# Patient Record
Sex: Male | Born: 1965 | Race: Black or African American | Hispanic: No | Marital: Single | Smoking: Current some day smoker
Health system: Southern US, Community
[De-identification: ages and names within clinical notes are randomized; demographics above are authoritative.]

## PROBLEM LIST (undated history)

## (undated) DIAGNOSIS — I272 Pulmonary hypertension, unspecified: Secondary | ICD-10-CM

## (undated) DIAGNOSIS — Z21 Asymptomatic human immunodeficiency virus [HIV] infection status: Secondary | ICD-10-CM

## (undated) DIAGNOSIS — B20 Human immunodeficiency virus [HIV] disease: Secondary | ICD-10-CM

## (undated) DIAGNOSIS — L309 Dermatitis, unspecified: Secondary | ICD-10-CM

## (undated) DIAGNOSIS — I5022 Chronic systolic (congestive) heart failure: Secondary | ICD-10-CM

## (undated) DIAGNOSIS — I10 Essential (primary) hypertension: Secondary | ICD-10-CM

## (undated) DIAGNOSIS — I2510 Atherosclerotic heart disease of native coronary artery without angina pectoris: Secondary | ICD-10-CM

## (undated) HISTORY — PX: APPENDECTOMY: SHX54

## (undated) HISTORY — DX: Pulmonary hypertension, unspecified: I27.20

## (undated) HISTORY — DX: Chronic systolic (congestive) heart failure: I50.22

## (undated) HISTORY — DX: Atherosclerotic heart disease of native coronary artery without angina pectoris: I25.10

---

## 2012-03-11 ENCOUNTER — Emergency Department: Payer: Self-pay | Admitting: Emergency Medicine

## 2012-11-06 ENCOUNTER — Emergency Department: Payer: Self-pay | Admitting: Emergency Medicine

## 2013-04-20 ENCOUNTER — Emergency Department: Payer: Self-pay | Admitting: Internal Medicine

## 2013-07-19 LAB — CBC WITH DIFFERENTIAL/PLATELET
Basophil %: 0.2 %
Eosinophil #: 0 10*3/uL (ref 0.0–0.7)
Eosinophil %: 0 %
HGB: 13.9 g/dL (ref 13.0–18.0)
Lymphocyte #: 0.8 10*3/uL — ABNORMAL LOW (ref 1.0–3.6)
Lymphocyte %: 6.6 %
MCH: 29.6 pg (ref 26.0–34.0)
Neutrophil %: 84.4 %
RBC: 4.68 10*6/uL (ref 4.40–5.90)

## 2013-07-19 LAB — URINALYSIS, COMPLETE
Blood: NEGATIVE
Glucose,UR: NEGATIVE mg/dL (ref 0–75)
Nitrite: NEGATIVE
Ph: 6 (ref 4.5–8.0)
RBC,UR: 1 /HPF (ref 0–5)
Specific Gravity: 1.035 (ref 1.003–1.030)
Squamous Epithelial: <1
WBC UR: 2 /HPF (ref 0–5)

## 2013-07-19 LAB — COMPREHENSIVE METABOLIC PANEL
Albumin: 2.6 g/dL — ABNORMAL LOW (ref 3.4–5.0)
Chloride: 99 mmol/L (ref 98–107)
Co2: 30 mmol/L (ref 21–32)
Creatinine: 1.16 mg/dL (ref 0.60–1.30)
EGFR (African American): >60
EGFR (Non-African Amer.): >60
Glucose: 118 mg/dL — ABNORMAL HIGH (ref 65–99)
SGPT (ALT): 10 U/L — ABNORMAL LOW (ref 12–78)

## 2013-07-19 LAB — LIPASE, BLOOD: Lipase: 73 U/L (ref 73–393)

## 2013-07-20 ENCOUNTER — Inpatient Hospital Stay: Payer: Self-pay | Admitting: Surgery

## 2013-07-20 LAB — CBC WITH DIFFERENTIAL/PLATELET
Basophil #: 0 10*3/uL (ref 0.0–0.1)
Basophil %: 0 %
Eosinophil #: 0.1 10*3/uL (ref 0.0–0.7)
Eosinophil %: 0.5 %
HCT: 42.7 % (ref 40.0–52.0)
Lymphocyte #: 0.3 10*3/uL — ABNORMAL LOW (ref 1.0–3.6)
MCHC: 33.1 g/dL (ref 32.0–36.0)
MCV: 90 fL (ref 80–100)
Monocyte #: 0.7 x10 3/mm (ref 0.2–1.0)
Monocyte %: 6.1 %
Neutrophil #: 10 10*3/uL — ABNORMAL HIGH (ref 1.4–6.5)
Neutrophil %: 90.7 %
Platelet: 196 10*3/uL (ref 150–440)
RBC: 4.76 10*6/uL (ref 4.40–5.90)
WBC: 11 10*3/uL — ABNORMAL HIGH (ref 3.8–10.6)

## 2013-07-21 LAB — CBC WITH DIFFERENTIAL/PLATELET
Basophil %: 0.2 %
Eosinophil #: 0 10*3/uL (ref 0.0–0.7)
Eosinophil %: 0 %
HCT: 40.9 % (ref 40.0–52.0)
Lymphocyte #: 0.5 10*3/uL — ABNORMAL LOW (ref 1.0–3.6)
Lymphocyte %: 6.5 %
MCH: 30.1 pg (ref 26.0–34.0)
MCHC: 33.7 g/dL (ref 32.0–36.0)
MCV: 89 fL (ref 80–100)
Monocyte #: 0.8 x10 3/mm (ref 0.2–1.0)
Neutrophil #: 7 10*3/uL — ABNORMAL HIGH (ref 1.4–6.5)
Neutrophil %: 84.1 %
Platelet: 225 10*3/uL (ref 150–440)
RDW: 13.7 % (ref 11.5–14.5)

## 2013-07-21 LAB — BASIC METABOLIC PANEL
BUN: 14 mg/dL (ref 7–18)
Calcium, Total: 8.9 mg/dL (ref 8.5–10.1)
Creatinine: 1.05 mg/dL (ref 0.60–1.30)
EGFR (African American): >60
EGFR (Non-African Amer.): >60
Osmolality: 274 (ref 275–301)
Sodium: 136 mmol/L (ref 136–145)

## 2013-07-25 LAB — PLATELET COUNT: Platelet: 355 10*3/uL (ref 150–440)

## 2014-11-25 NOTE — H&P (Signed)
Subjective/Chief Complaint abd pain   History of Present Illness abd pain lower abd since Friday no prior similar episode nausea, no emesis no f/c nml BM ate a hambuger and peas at 2000 prior to coming to ED, no emesis   Past History HIV, gets care at [facility] but ran out of meds. Should be on four meds. no prior surgery   Past Medical Health Smoking, Alcohol Consumption/Dependency   Past Med/Surgical Hx:  HIV:   NONE:   ALLERGIES:  No Known Allergies:   Family and Social History:  Family History Non-Contributory   Social History positive  tobacco, positive  tobacco (Current within 1 year), positive ETOH, negative Illicit drugs, daily smoking and ETOH, denioes IVDA   + Tobacco Current (within 1 year)   Place of Living Home   Review of Systems:  Fever/Chills No   Cough No   Abdominal Pain Yes   Diarrhea No   Constipation No   Nausea/Vomiting Yes   SOB/DOE No   Chest Pain No   Dysuria No   Tolerating Diet Yes  Nauseated   Physical Exam:  GEN uncomfortable   HEENT pink conjunctivae   NECK supple   RESP normal resp effort  clear BS   CARD regular rate   ABD positive tenderness  no hernia  distended  normal BS  tender diffusely   LYMPH negative neck   EXTR negative cyanosis/clubbing, negative edema   SKIN normal to palpation   PSYCH alert, A+O to time, place, person, good insight   Lab Results: Hepatic:  15-Dec-14 22:13   Bilirubin, Total 1.0  Alkaline Phosphatase 82 (45-117 NOTE: New Reference Range 06/25/13)  SGPT (ALT)  10  SGOT (AST)  14  Total Protein, Serum  9.1  Albumin, Serum  2.6  Routine Chem:  15-Dec-14 22:13   Lipase 73 (Result(s) reported on 19 Jul 2013 at 10:41PM.)  Glucose, Serum  118  BUN  19  Creatinine (comp) 1.16  Sodium, Serum  133  Potassium, Serum  3.3  Chloride, Serum 99  CO2, Serum 30  Calcium (Total), Serum 9.0  Osmolality (calc) 270  eGFR (African American) >60  eGFR (Non-African American) >60  (eGFR values <28m/min/1.73 m2 may be an indication of chronic kidney disease (CKD). Calculated eGFR is useful in patients with stable renal function. The eGFR calculation will not be reliable in acutely ill patients when serum creatinine is changing rapidly. It is not useful in  patients on dialysis. The eGFR calculation may not be applicable to patients at the low and high extremes of body sizes, pregnant women, and vegetarians.)  Anion Gap  4  Routine UA:  15-Dec-14 22:13   Color (UA) Amber  Clarity (UA) Hazy  Glucose (UA) Negative  Bilirubin (UA) Negative  Ketones (UA) Trace  Specific Gravity (UA) 1.035  Blood (UA) Negative  pH (UA) 6.0  Protein (UA) 100 mg/dL  Nitrite (UA) Negative  Leukocyte Esterase (UA) Negative (Result(s) reported on 19 Jul 2013 at 11:46PM.)  RBC (UA) 1 /HPF  WBC (UA) 2 /HPF  Bacteria (UA) NONE SEEN  Epithelial Cells (UA) <1 /HPF  Mucous (UA) PRESENT (Result(s) reported on 19 Jul 2013 at 11:46PM.)  Routine Hem:  15-Dec-14 22:13   WBC (CBC)  11.4  RBC (CBC) 4.68  Hemoglobin (CBC) 13.9  Hematocrit (CBC) 41.4  Platelet Count (CBC) 214  MCV 89  MCH 29.6  MCHC 33.5  RDW 13.9  Neutrophil % 84.4  Lymphocyte % 6.6  Monocyte % 8.8  Eosinophil % 0.0  Basophil % 0.2  Neutrophil #  9.6  Lymphocyte #  0.8  Monocyte # 1.0  Eosinophil # 0.0  Basophil # 0.0 (Result(s) reported on 19 Jul 2013 at 10:41PM.)   Radiology Results: CT:    15-Dec-14 23:53, CT Abdomen and Pelvis With Contrast  CT Abdomen and Pelvis With Contrast  REASON FOR EXAM:    (1) diffuse abd pain, tender, worse in RLQ. h/o HIV.   eval appy; (2) diffuse abd  COMMENTS:   May transport without cardiac monitor    PROCEDURE: CT  - CT ABDOMEN / PELVIS  W  - Jul 19 2013 11:53PM     CLINICAL DATA:  Abdominal cramping, diarrhea and nausea for 3 days,  dark urine, dizziness, dark stools, diffuse abdominal pain and  tenderness worse in right lower quadrant, history HIV    EXAM:  CT ABDOMEN  AND PELVIS WITH CONTRAST    TECHNIQUE:  Multidetector CT imaging of the abdomen and pelvis was performed  using the standard protocol following bolus administration of  intravenous contrast. Sagittal and coronal MPR images reconstructed  from axial data set.    CONTRAST:  Dilute oral contrast.  100 cc Isovue 300 IV    COMPARISON:  None    FINDINGS:  Posterior right diaphragmatic hernia containing fat.    Lung bases clear.    Liver, spleen, pancreas, kidneys, and adrenal glands normal  appearance.  Right lower quadrant inflammatory process identified with a markedly  enlarged and thickened appendix with extensive periappendiceal  inflammatory changes compatible with acute appendicitis.    Significant periappendiceal inflammatory changes and foci of gas  compatible with perforation.    A discrete abscess collection is not visualized.    Stomach and bowel loops otherwise normal appearance.    Minimal ascites.    No definite free intraperitoneal air.  External iliac and inguinal lymph nodes bilaterally, normal to  minimally enlarged.    No acute osseous findings.     IMPRESSION:  Markedly enlarged and thickened appendix with extensive  periappendiceal inflammatory changes and a few tiny foci of  extraluminal gas compatible with perforated appendicitis.    No definite discrete abscess collection identified.    Findings discussed with Dr. Joni Fears on 07/20/2013 at 0011 hr.    Electronically Signed    By: Lavonia Dana M.D.    On: 07/20/2013 00:12         Verified By: Burnetta Sabin, M.D.,    Assessment/Admission Diagnosis CT rev'd probable ruptured appendicitis pt ate full meal at 8pm before coming to ED without emesis will admit hydrate start IV abx plan lap appy in am risks options rev'd with pt discussed with ED MD and nursing   Electronic Signatures: Florene Glen (MD)  (Signed 16-Dec-14 00:52)  Authored: CHIEF COMPLAINT and HISTORY, PAST  MEDICAL/SURGIAL HISTORY, ALLERGIES, FAMILY AND SOCIAL HISTORY, REVIEW OF SYSTEMS, PHYSICAL EXAM, LABS, Radiology, ASSESSMENT AND PLAN   Last Updated: 16-Dec-14 00:52 by Florene Glen (MD)

## 2014-11-25 NOTE — H&P (Signed)
PATIENT NAME:  Manuel Rosales, [name] MR#:  [id number] DATE OF BIRTH:  1966/01/19  DATE OF ADMISSION:  07/19/2013  CHIEF COMPLAINT: Abdominal pain.   HISTORY OF PRESENT ILLNESS: This is a 49 year old African American male patient with a history of several days of abdominal pain. He states it started on Friday. He has never had an episode like this before. He points to the lower quadrants diffusely as the source of his pain and tenderness. He has had nausea but has not been able to vomit. He ate a full meal, including a hamburger and peas, this evening before coming to the Emergency Room at approximately 7 to 8 p.m. and did not have any emesis after that. He has had normal bowel movements without melena or hematochezia, but when he has a bowel movement, he has increased pain in his suprapubic area. He denies fevers or chills.   PAST MEDICAL HISTORY: HIV. He did not volunteer how he contracted HIV but states he has never used IV drugs before and does not use them now. [facility] and states that he should be on 4 medications for his HIV, but he ran out and has not refilled them and has not been back to the clinic in some time. Denies any other medical problems.   PAST SURGICAL HISTORY: None.   ALLERGIES: None.   MEDICATIONS: Unknown antiviral medications.   FAMILY HISTORY: Noncontributory.   SOCIAL HISTORY: The patient smokes daily. He drinks alcohol daily. Does not use IV drugs.   REVIEW OF SYSTEMS: A 10 system review is performed and negative with the exception of that mentioned in the HPI.    PHYSICAL EXAMINATION:  GENERAL: Very uncomfortable-appearing male patient.  VITALS: Temperature of 97.9, pulse of 86, respirations 20, blood pressure 140/95.  HEENT: No scleral icterus.  NECK: No palpable neck nodes.  CHEST: Clear to auscultation.  CARDIAC: Regular rate and rhythm.  ABDOMEN: Distended, tympanitic, considerably tender in both lower quadrants without localization to  the right lower quadrant. There is percussion tenderness. Minimal rebound tenderness. Minimal guarding.  EXTREMITIES: Without edema.  NEUROLOGIC: Grossly intact.  INTEGUMENT: No jaundice.   LABORATORY DATA: Urinalysis is clean.   Hemogram shows a white blood cell count of 11.4, H and H of 13.9 and 41 and a platelet count of 214. Blood chemistries show a creatinine of 1.16, sodium 133, potassium of 3.3. Otherwise, normal electrolytes. Serum protein is elevated at 9.1, with a low albumin of 2.6.   CT scan is personally reviewed, suggesting a ruptured appendix with considerable periappendiceal inflammatory process.   ASSESSMENT AND PLAN: This is a patient with likely a ruptured appendix both on physical exam, history and CT findings. He also has a leukocytosis. My original plan was to take him to the operating room this evening, but he informed me that he ate a full meal at approximately 8 p.m. before coming to the Emergency Room and did not vomit after that. He did not feel like he was going to vomit now. With that in mind, I believe that it was prudent to admit him to the hospital, start some aggressive hydration and IV antibiotics and plan laparoscopic versus open procedure in the morning. The rationale for this was discussed with the patient. The options of observation have been reviewed, and the risks of an emergency operation with a full stomach were reviewed, as were the risks of bleeding, infection, the high risk of a recurrent abscess, especially in an immunocompromised  patient after a ruptured appendix and appendectomy, was discussed with him. The risks of bleeding, infection, recurrent abscess and conversion to an open procedure were reviewed as well. He understood and agreed to proceed. I will schedule this case for early in the morning.   ____________________________ Adah Salvage. Excell Seltzer, MD rec:gb D: 07/20/2013 00:57:11 ET T: 07/20/2013 01:05:26 ET JOB#: [id number]  cc: Adah Salvage. Excell Seltzer, MD,  <Dictator> Lattie Haw MD ELECTRONICALLY SIGNED 07/20/2013 21:02

## 2014-11-25 NOTE — Consult Note (Signed)
Brief Consult Note: Diagnosis: High Blood Pressure (Hypertension), HIV, ruptured appendicitis status post laparotomy and appendectomy and drainage of pelvic abscess.   Patient was seen by consultant.   Consult note dictated.   Orders entered.   Comments: 47y/om with recently diagnosed with HIV in the prison abouyt 1-2 months ago, currently not on HAART and possible history of High Blood Pressure (Hypertension) admitted for acute ruptured appendicitis. Medical consult requested for High Blood Pressure (Hypertension)  * HTN- known history of High Blood Pressure (Hypertension), not on meds at home.  now NPO- so start on IV hydralazine prn, partly also accelerated by pain. might need PO meds at discharge  * HIV- diagnosed while in prison 2months ago, after discharge [facility] and was given meds for 2weeks and patient didnt follow up will need follow up will get viral load and CD4 count here  * Ruptured appendicitis- status post surgery, POD # 1, management per surgery also noted pelvic abscess seen and drained- on zosyn empirically pain management NPO for now  * Tobacco use disorder- counselled for 3min and started on nicotine patch.  Electronic Signatures: Enid BaasKalisetti, Kenyah Luba (MD)  (Signed 17-Dec-14 11:19)  Authored: Brief Consult Note   Last Updated: 17-Dec-14 11:19 by Enid BaasKalisetti, Claris Guymon (MD)

## 2014-11-25 NOTE — Op Note (Signed)
PATIENT NAME:  Manuel BarbaraBURTON, Manuel J MR#:  [id number] DATE OF BIRTH:  Jun 12, 1966  DATE OF PROCEDURE:  07/20/2013  PREOPERATIVE DIAGNOSIS: Acute appendicitis.   POSTOPERATIVE DIAGNOSIS: Acute perforated gangrenous appendicitis with pelvic abscess.   PROCEDURE PERFORMED:  1.  Attempted diagnostic laparoscopy.  2.  Laparotomy with lysis of adhesions.  3.  Open appendectomy, oversew of appendiceal stump with Cheree DittoGraham patch, drainage of pelvic abscess.   SURGEON: Hampton Wixom A. Egbert GaribaldiBird, MD   ASSISTANT: Scrub tech.  TYPE OF ANESTHESIA: General endotracheal.   ESTIMATED BLOOD LOSS: 100 mL.   DRAINS: Jackson-Pratt in the pelvis.   SPECIMENS: Portion of appendix to pathology.   DESCRIPTION OF PROCEDURE: With informed consent in supine position, general endotracheal anesthesia was induced. The patient's left and right arms were both tucked and padded at his side. The patient's abdomen was clipped of hair, prepped and draped with ChloraPrep solution. Attempt at Foley catheter was unsuccessful.   Timeout was observed.   A 12 mm blunt Hasson trocar was placed through an open technique through an infraumbilical transversely oriented skin incision with stay sutures being passed through the fascia. Pneumoperitoneum was established. A 10 mm angled surgical telescope was inserted into the abdomen. A 5 mm bladeless trocar was placed in the left lower quadrant. There appeared to be adhesions along the anterior abdominal wall along the right lateral abdomen. Manipulation of the intestines down to the pelvis demonstrated a marked distention of the small intestine. The terminal ileum could be identified; however, the appendix could not be identified. There was some seropurulent fluid within the pelvis. Despite some manipulation, I was unable to get adequate access to do the operation laparoscopically and as such, laparotomy was then converted. Ports were then removed. Pneumoperitoneum was released.   A midline incision was  fashioned just above the umbilicus to just above the pubic symphysis with scalpel and electrocautery through musculofascial layers. A self-retaining abdominal wall retractor was applied. Small bowel was packed in the upper abdomen. The appendix was identified and appeared to be perforated at its base with a portion of it missing. The cecum was rotated medially by division of the lateral attachments along the white line of Toldt utilizing electrocautery. The courses of the iliac vein, right ureter, and gonadal vessels were identified.   With the cecum fully mobilized and the small intestine defined in this area where the terminal ileum entered into the cecum, the appendiceal orifice was within 1 to 2 cm of the ileocecal valve and rather indurated. Attempt at partial cecectomy with a stapler would not have been feasible. The tissues around the area looked to be able to hold suture and as such, the area was irrigated and imbricated utilizing figure-of-eight #2-0 silk suture x 2. The mesoappendix being critically identified, along its pedicle was divided utilizing the LigaSure device along the base of the appendix. The specimen was submitted to pathology in formalin. A small amount of bleeding was encountered and controlled with point cautery and the application of the LigaSure.   Pelvic abscess was drained in the process, irrigated thoroughly with several liters of warm normal saline. Hemostasis appeared to be adequate on the operative field. The omentum was then taken off of the left colon along a congenital attachment utilizing electrocautery. It was placed over the appendiceal repair and stump, secured in place with silk suture along the seromuscular edge of the cecum. Small bowel was returned to its anatomical position. With lap and needle count correct x 2, the fascia was closed  from the extremes of the wound utilizing running #1 looped PDS on either side. Subcutaneous tissues were again irrigated, and skin  staples were used to reapproximate skin edges. A sterile dressing was applied. The patient was subsequently extubated and taken to the recovery room in stable and satisfactory condition by anesthesia services.    ____________________________ Redge Gainer Egbert Garibaldi, MD mab:jcm D: 07/21/2013 13:28:00 ET T: 07/21/2013 19:43:20 ET JOB#: [id number]  cc: Loraine Leriche A. Egbert Garibaldi, MD, <Dictator> Raynald Kemp MD ELECTRONICALLY SIGNED 07/29/2013 11:32

## 2014-11-26 NOTE — Consult Note (Signed)
PATIENT NAME:  Manuel Rosales, Manuel Rosales MR#:  [id number] DATE OF BIRTH:  Jan 29, 1966  DATE OF CONSULTATION:  07/21/2013  ADMITTING PHYSICIAN: Dr. Dionne Milo.   CONSULTING PHYSICIAN:  Enid Baas, MD  PRIMARY CARE PHYSICIAN: Currently none.   REASON FOR CONSULTATION: Hypertension.   HISTORY OF PRESENT ILLNESS: Manuel Rosales is a 49 year old African American male with past medical history significant for recently diagnosed with HIV while he was in prison about 1-1/2 to 2 months ago, not on any medications currently. Comes to the hospital secondary to abdominal pain associated with nausea that started about 4 days ago. The patient had a CT abdomen done when he came in and it showed ruptured appendix and possible pelvic abscess.  He was taken to the OR yesterday, and had open appendectomy and drainage of the pelvic abscess done. Postoperatively, the patient's blood pressure has been in the 160 to 175 range systolic and 100 to 110 range diastolic. The patient states he was never diagnosed with hypertension before, but he was in prison for 5 months about 7 months ago. There, when they were checking his vitals, they noted that his pressure has been consistently on the higher side. He says blood pressure runs in the family and he probably does have hypertension based on the present checks. During his stay at prison only his blood work was done for HIV and it came back positive, so he followed up at [facility] after discharge as recommended by a nurse, and he was started on HAART medication for 2 weeks. After he ran out of the medications, he did not go for followup. He thinks he might have contacted the HIV through sexual contact as he denies any IV drugs. Currently, the patient has only abdominal pain from the surgery, but otherwise appears comfortable, not anxious or in distress.   PAST MEDICAL HISTORY: 1. Recently diagnosed with HIV, currently not on any HAART medications.  2. Tobacco use disorder.   PAST SURGICAL  HISTORY: None other than the appendectomy done yesterday.   ALLERGIES TO MEDICATIONS: No known drug allergies.   CURRENT HOME MEDICATIONS:   None. SOCIAL HISTORY: Lives at home with his mother. Smokes about 1/2 pack every day and drinks beer almost every day, hard liquor occasionally.  He recently served time in prison for 5 months and just got released about a month and half ago.   FAMILY HISTORY:  Mom with both diabetes and hypertension.   REVIEW OF SYSTEMS:  CONSTITUTIONAL: No fever. No fatigue or weakness.  EYES: Positive for blurred vision. No inflammation, glaucoma or cataracts.  ENT: No tinnitus, ear pain, hearing loss, epistaxis or discharge.  RESPIRATORY: Positive for cough. No wheeze, hemoptysis or COPD.  CARDIOVASCULAR: No chest pain, orthopnea, edema, arrhythmia, palpitations or syncope.  GASTROINTESTINAL: Positive for nausea. No vomiting, diarrhea. Positive for abdominal pain. No hematemesis or melena.  GENITOURINARY: No dysuria, hematuria, renal calculus, frequency or incontinence.  ENDOCRINE: No polyuria, nocturia, thyroid problems, heat or cold intolerance.  HEMATOLOGY: No anemia, easy bruising or bleeding.  SKIN: Positive for eczema, thickening of the skin on both the arms.  MUSCULOSKELETAL: No neck, back, shoulder pain, arthritis or gout.  NEUROLOGIC: No numbness, weakness, CVA, TIA or seizures.  PSYCHOLOGIC: No anxiety, insomnia or depression.   PHYSICAL EXAMINATION: VITAL SIGNS: Temperature 98 degrees Fahrenheit, pulse 97, respirations 18, blood pressure 175/103, pulse oximetry 93% on room air.  GENERAL: Well-built, ill-nourished male lying in bed, not in any acute distress.  HEENT: Normocephalic, atraumatic. Pupils equal, round,  reacting to light. Anicteric sclerae. Extraocular movements intact. Oropharynx clear without erythema, mass or exudates.  NECK: Supple. No thyromegaly, JVD or carotid bruits. No lymphadenopathy.  LUNGS: Moving air bilaterally. No wheeze or  crackles. No use of accessory muscles for breathing.  CARDIOVASCULAR: S1, S2 regular rate and rhythm, 3/6 systolic murmur heard. No rubs or gallops.  ABDOMEN: Has a post laparotomy dressing in place and a JP drain in place. It is tender with voluntary guarding, no rigidity. No rebound tenderness. Hyperactive bowel sounds are present.  EXTREMITIES: No pedal edema. No clubbing or cyanosis, 2+ dorsalis pedis pulses palpable bilaterally.   SKIN: Positive for thickening and dry skin. Eczema noted on both arms and also on the shins bilaterally. No open ulcers or rashes, otherwise.  LYMPHATICS: No cervical lymphadenopathy.  NEUROLOGIC: Cranial nerves II through XII remain intact. No gross motor or sensory deficits.  PSYCHOLOGICAL: The patient is awake, alert, oriented x 3.   LABORATORY DATA: WBC 8.3, hemoglobin 13.8, hematocrit 40.9, platelet count 225.   Sodium 136, potassium 4.2, chloride 101, bicarbonate 30, BUN 14, creatinine 1.05, glucose 132 and calcium of 8.9.   CT of the abdomen and pelvis on admission showing markedly enlarged and thickened appendix with extensive periappendiceal inflammatory changes with few tiny foci of extraluminal gas compatible with perforated appendicitis. No discrete abscess noted. Urinalysis negative for any infection when he came in. Chest x-ray showing no acute abnormalities, clear lung fields.   RECOMMENDATIONS:  A 49 year old male recently diagnosed with HIV about 2 months ago, currently not on any HAART therapy and possible history of hypertension, not on any medications admitted for acute ruptured appendicitis and had appendectomy done. Medical consult was requested for hypertension.  1. Hypertension with known history of high blood pressure recently, not on any medications at home. Currently n.p.o. We will start on IV hydralazine p.r.n. Part of the hypertension is also secondary to pain. Will need p.o. medications at discharge, we will continue to evaluate.  2.  Human immunodeficiency virus was diagnosed about 2 months ago in the prison, likely secondary to sexual contact. We will also check hepatitis panel, CD4 count and viral load. The patient was started on HAART therapy for 2 weeks and has been out of medications for more than a month now. He will need outpatient followup; can [facility].  3. Ruptured appendicitis status post surgery, postoperative day 1 today. Further management per surgery. Pain control and also had pelvic abscess that was drained according to surgical note. He is placed on Zosyn empirically for now. He remains n.p.o. and diet advancement per surgery.  4. Tobacco use disorder. He was counseled for 3 minutes against smoking and was started on a nicotine patch.  5. Gastrointestinal and deep vein thrombosis prophylaxis, on IV Protonix and TEDs and sequential compression devices.   CODE STATUS: FULL CODE.   TIME SPENT ON CONSULTATION: 50 minutes.   ____________________________ Enid Baasadhika Tully Burgo, MD rk:sg D: 07/21/2013 11:19:00 ET T: 07/21/2013 11:51:02 ET JOB#: [id number]  cc: Enid Baasadhika Ermal Haberer, MD, <Dictator> Enid BaasADHIKA Brylan Seubert MD ELECTRONICALLY SIGNED 08/17/2013 14:22

## 2014-11-26 NOTE — Discharge Summary (Signed)
PATIENT NAME:  Manuel Rosales, Macalister J MR#:  [id number] DATE OF BIRTH:  1966-02-27  DATE OF ADMISSION:  07/20/2013 DATE OF DISCHARGE:  07/26/2013  FINAL DIAGNOSES:  1.  Acute perforated appendicitis.  2.  Poorly controlled hypertension.  3.  Human immunodeficiency virus infection.   HOSPITAL COURSE SUMMARY:  The patient was admitted to the surgical service with significant abdominal pain and acute appendicitis. Due to the fact that he had eaten several hours prior to his admission in the Emergency Room, his surgical intervention was delayed until the following morning. The patient had open appendectomy with drainage of pelvic abscess via laparotomy on December 16th. Jackson-Pratt drain was left in place. Postoperatively, pain was a major issue for the patient. He did have a nasogastric tube left in place at the time of surgery, which he removed in the PACU and it was not replaced. His pain was controlled with morphine PCA. He was treated with postoperative intravenous antibiotics. On postoperative day #1, the patient seemed to be doing well. Pain was a major issue. Postoperative day #2, the patient continued to improve. On postoperative day #3, JP remained, serous fluid. He was started on toradol.  Blood pressure was a major issue, and internal medicine became involved. His medications were adjusted. On postoperative day #5, he was passing flatus and had a small bowel movement. His PCA was discontinued. On postoperative day #6, the patient had several more bowel movements and had adequate pain control with oral pain medication and wanted to go home. His Jackson-Pratt drain was removed. His abdomen was nearly flat and soft. His wound was healing nicely.   DISCHARGE MEDICATIONS:  The patient was encouraged to restart on any of his home HIV medications, being [facility]. He is given amoxicillin 500 mg by mouth b.i.d. for an additional five days. Tramadol 50 mg by mouth every 6 hours as needed for  pain, metoprolol tartrate 75 mg by mouth b.i.d., and lisinopril 20 mg by mouth once a day.   DISCHARGE INSTRUCTIONS:  He can shower, follow up with me in the [facility], call with any questions or concerns, fever, or drainage from his wound.    ____________________________ Redge GainerMark A. Egbert GaribaldiBird, MD mab:ms D: 08/04/2013 20:59:29 ET T: 08/04/2013 21:44:33 ET JOB#: [id number]  cc: Loraine LericheMark A. Egbert GaribaldiBird, MD, <Dictator> Raynald KempMARK A Kenadi Miltner MD ELECTRONICALLY SIGNED 08/05/2013 19:59

## 2017-01-04 ENCOUNTER — Inpatient Hospital Stay
Admission: EM | Admit: 2017-01-04 | Discharge: 2017-01-06 | DRG: 388 | Disposition: A | Discharge status: Home / Self Care | Payer: Medicaid Other | Attending: General Surgery | Admitting: General Surgery

## 2017-01-04 ENCOUNTER — Encounter: Payer: Self-pay | Admitting: Emergency Medicine

## 2017-01-04 ENCOUNTER — Emergency Department: Payer: Medicaid Other

## 2017-01-04 DIAGNOSIS — Z8249 Family history of ischemic heart disease and other diseases of the circulatory system: Secondary | ICD-10-CM | POA: Diagnosis not present

## 2017-01-04 DIAGNOSIS — B20 Human immunodeficiency virus [HIV] disease: Secondary | ICD-10-CM | POA: Diagnosis present

## 2017-01-04 DIAGNOSIS — K56609 Unspecified intestinal obstruction, unspecified as to partial versus complete obstruction: Secondary | ICD-10-CM | POA: Diagnosis present

## 2017-01-04 DIAGNOSIS — F17210 Nicotine dependence, cigarettes, uncomplicated: Secondary | ICD-10-CM | POA: Diagnosis present

## 2017-01-04 DIAGNOSIS — K56600 Partial intestinal obstruction, unspecified as to cause: Principal | ICD-10-CM | POA: Diagnosis present

## 2017-01-04 DIAGNOSIS — I10 Essential (primary) hypertension: Secondary | ICD-10-CM | POA: Diagnosis present

## 2017-01-04 DIAGNOSIS — L309 Dermatitis, unspecified: Secondary | ICD-10-CM | POA: Diagnosis present

## 2017-01-04 HISTORY — DX: Dermatitis, unspecified: L30.9

## 2017-01-04 HISTORY — DX: Human immunodeficiency virus (HIV) disease: B20

## 2017-01-04 HISTORY — DX: Asymptomatic human immunodeficiency virus (hiv) infection status: Z21

## 2017-01-04 LAB — CBC WITH DIFFERENTIAL/PLATELET
BASOS ABS: 0 10*3/uL (ref 0–0.1)
Basophils Relative: 1 %
Eosinophils Absolute: 0.1 10*3/uL (ref 0–0.7)
Eosinophils Relative: 1 %
HEMATOCRIT: 40.9 % (ref 40.0–52.0)
Hemoglobin: 14 g/dL (ref 13.0–18.0)
LYMPHS ABS: 0.4 10*3/uL — AB (ref 1.0–3.6)
LYMPHS PCT: 4 %
MCH: 31 pg (ref 26.0–34.0)
MCHC: 34.2 g/dL (ref 32.0–36.0)
MCV: 90.8 fL (ref 80.0–100.0)
MONO ABS: 0.2 10*3/uL (ref 0.2–1.0)
MONOS PCT: 3 %
NEUTROS ABS: 7.7 10*3/uL — AB (ref 1.4–6.5)
Neutrophils Relative %: 91 %
Platelets: 245 10*3/uL (ref 150–440)
RBC: 4.5 MIL/uL (ref 4.40–5.90)
RDW: 13.2 % (ref 11.5–14.5)
WBC: 8.4 10*3/uL (ref 3.8–10.6)

## 2017-01-04 LAB — COMPREHENSIVE METABOLIC PANEL
ALT: 12 U/L — ABNORMAL LOW (ref 17–63)
AST: 36 U/L (ref 15–41)
Albumin: 3.4 g/dL — ABNORMAL LOW (ref 3.5–5.0)
Alkaline Phosphatase: 73 U/L (ref 38–126)
Anion gap: 12 (ref 5–15)
BILIRUBIN TOTAL: 0.6 mg/dL (ref 0.3–1.2)
BUN: 13 mg/dL (ref 6–20)
CO2: 22 mmol/L (ref 22–32)
Calcium: 8.6 mg/dL — ABNORMAL LOW (ref 8.9–10.3)
Chloride: 102 mmol/L (ref 101–111)
Creatinine, Ser: 1.01 mg/dL (ref 0.61–1.24)
GFR calc Af Amer: >60 mL/min (ref >60)
Glucose, Bld: 87 mg/dL (ref 65–99)
POTASSIUM: 4.3 mmol/L (ref 3.5–5.1)
Sodium: 136 mmol/L (ref 135–145)
TOTAL PROTEIN: 8.3 g/dL — AB (ref 6.5–8.1)

## 2017-01-04 LAB — LIPASE, BLOOD: LIPASE: 19 U/L (ref 11–51)

## 2017-01-04 LAB — ETHANOL: Alcohol, Ethyl (B): 36 mg/dL — ABNORMAL HIGH (ref <5)

## 2017-01-04 LAB — TROPONIN I: Troponin I: <0.03 ng/mL (ref <0.03)

## 2017-01-04 MED ORDER — MORPHINE SULFATE (PF) 4 MG/ML IV SOLN
4.0000 mg | INTRAVENOUS | Status: DC | PRN
Start: 2017-01-04 — End: 2017-01-06
  Administered 2017-01-04 – 2017-01-05 (×2): 4 mg via INTRAVENOUS
  Filled 2017-01-04 (×2): qty 1

## 2017-01-04 MED ORDER — PANTOPRAZOLE SODIUM 40 MG IV SOLR
40.0000 mg | Freq: Every day | INTRAVENOUS | Status: DC
  Administered 2017-01-04 – 2017-01-05 (×2): 40 mg via INTRAVENOUS
  Filled 2017-01-04 (×2): qty 40

## 2017-01-04 MED ORDER — NICOTINE 14 MG/24HR TD PT24
14.0000 mg | MEDICATED_PATCH | Freq: Every day | TRANSDERMAL | Status: DC
  Administered 2017-01-04 – 2017-01-06 (×3): 14 mg via TRANSDERMAL
  Filled 2017-01-04 (×3): qty 1

## 2017-01-04 MED ORDER — ADULT MULTIVITAMIN W/MINERALS CH
1.0000 | ORAL_TABLET | Freq: Every day | ORAL | Status: DC
  Administered 2017-01-04 – 2017-01-06 (×3): 1 via ORAL
  Filled 2017-01-04 (×3): qty 1

## 2017-01-04 MED ORDER — THIAMINE HCL 100 MG/ML IJ SOLN: 100.0000 mg | Freq: Every day | INTRAMUSCULAR | Status: DC

## 2017-01-04 MED ORDER — DEXTROSE IN LACTATED RINGERS 5 % IV SOLN
INTRAVENOUS | Status: DC
  Administered 2017-01-04 (×2): via INTRAVENOUS

## 2017-01-04 MED ORDER — FOLIC ACID 1 MG PO TABS
1.0000 mg | ORAL_TABLET | Freq: Every day | ORAL | Status: DC
  Administered 2017-01-04 – 2017-01-06 (×3): 1 mg via ORAL
  Filled 2017-01-04 (×3): qty 1

## 2017-01-04 MED ORDER — ONDANSETRON HCL 4 MG/2ML IJ SOLN
4.0000 mg | Freq: Once | INTRAMUSCULAR | Status: AC
  Administered 2017-01-04: 4 mg via INTRAVENOUS
  Filled 2017-01-04: qty 2

## 2017-01-04 MED ORDER — ENOXAPARIN SODIUM 40 MG/0.4ML ~~LOC~~ SOLN
40.0000 mg | SUBCUTANEOUS | Status: DC
  Administered 2017-01-04 – 2017-01-05 (×2): 40 mg via SUBCUTANEOUS
  Filled 2017-01-04 (×2): qty 0.4

## 2017-01-04 MED ORDER — HYDRALAZINE HCL 20 MG/ML IJ SOLN
10.0000 mg | INTRAMUSCULAR | Status: DC | PRN
  Administered 2017-01-04: 10 mg via INTRAVENOUS
  Filled 2017-01-04 (×2): qty 1

## 2017-01-04 MED ORDER — LORAZEPAM 2 MG/ML IJ SOLN: 1.0000 mg | Freq: Four times a day (QID) | INTRAMUSCULAR | Status: DC | PRN

## 2017-01-04 MED ORDER — ONDANSETRON HCL 4 MG/2ML IJ SOLN: 4.0000 mg | Freq: Four times a day (QID) | INTRAMUSCULAR | Status: DC | PRN

## 2017-01-04 MED ORDER — DIPHENHYDRAMINE HCL 50 MG/ML IJ SOLN
25.0000 mg | Freq: Four times a day (QID) | INTRAMUSCULAR | Status: DC | PRN
  Administered 2017-01-04: 25 mg via INTRAVENOUS
  Filled 2017-01-04: qty 1

## 2017-01-04 MED ORDER — HYDROCORTISONE 1 % EX CREA
TOPICAL_CREAM | Freq: Two times a day (BID) | CUTANEOUS | Status: DC
  Administered 2017-01-04 – 2017-01-06 (×5): via TOPICAL
  Filled 2017-01-04 (×2): qty 28

## 2017-01-04 MED ORDER — DIPHENHYDRAMINE HCL 25 MG PO CAPS: 25.0000 mg | ORAL_CAPSULE | Freq: Four times a day (QID) | ORAL | Status: DC | PRN

## 2017-01-04 MED ORDER — VITAMIN B-1 100 MG PO TABS
100.0000 mg | ORAL_TABLET | Freq: Every day | ORAL | Status: DC
  Administered 2017-01-04 – 2017-01-06 (×3): 100 mg via ORAL
  Filled 2017-01-04 (×3): qty 1

## 2017-01-04 MED ORDER — ONDANSETRON 4 MG PO TBDP: 4.0000 mg | ORAL_TABLET | Freq: Four times a day (QID) | ORAL | Status: DC | PRN

## 2017-01-04 MED ORDER — LORAZEPAM 1 MG PO TABS: 1.0000 mg | ORAL_TABLET | Freq: Four times a day (QID) | ORAL | Status: DC | PRN

## 2017-01-04 MED ORDER — MORPHINE SULFATE (PF) 4 MG/ML IV SOLN
4.0000 mg | Freq: Once | INTRAVENOUS | Status: AC
  Administered 2017-01-04: 4 mg via INTRAVENOUS
  Filled 2017-01-04: qty 1

## 2017-01-04 MED ORDER — IOPAMIDOL (ISOVUE-300) INJECTION 61%
100.0000 mL | Freq: Once | INTRAVENOUS | Status: AC | PRN
  Administered 2017-01-04: 100 mL via INTRAVENOUS

## 2017-01-04 MED ORDER — HYDRALAZINE HCL 20 MG/ML IJ SOLN
5.0000 mg | Freq: Four times a day (QID) | INTRAMUSCULAR | Status: DC
  Administered 2017-01-04 – 2017-01-05 (×5): 5 mg via INTRAVENOUS
  Filled 2017-01-04 (×4): qty 1

## 2017-01-04 NOTE — H&P (Signed)
Patient ID: Manuel Rosales, male   DOB: May 27, 1966, 51 y.o.   MRN: [id number]  CC: Abdominal pain  HPI Manuel Rosales is a 51 y.o. male who presents to the ER this evening with a one-day history of abdominal pain. Patient reports the pain started acutely and was followed by nausea and vomiting. Prior to this he states he's been having multiple loose bowel movements a day. He states she's never had pain attacks like this before. He does have a significant history of a ruptured appendectomy 4 years ago. He otherwise does not like to see physicians and does not have any current long-term care despite a previous diagnosis of HIV. Patient reports the pain is across his entire abdomen. It is not localized anyone quadrant more than the others. It is a stabbing sensation with that is worse but it comes and goes in waves. Otherwise he denies any fevers, chills, chest pain, shortness of breath.  HPI  Past medical history: HIV  Past Surgical History:  Procedure Laterality Date  . APPENDECTOMY      Family history: Hypertension in parents and extended relatives. No known history of any cancer or diabetes  Social History Social History  Substance Use Topics  . Smoking status: Current Every Day Smoker  . Smokeless tobacco: Never Used  . Alcohol use Yes    No Known Allergies  No current facility-administered medications for this encounter.    No current outpatient prescriptions on file.     Review of Systems A Multi-point review of systems was asked and was negative except for the findings documented in the history of present illness  Physical Exam Blood pressure (!) 170/109, pulse 93, temperature 98.1 F (36.7 C), temperature source Oral, resp. rate 20, height 6\' 1"  (1.854 m), weight 77.1 kg (170 lb), SpO2 99 %. CONSTITUTIONAL: Resting in bed in no acute distress. EYES: Pupils are equal, round, and reactive to light, Sclera are non-icteric. EARS, NOSE, MOUTH AND THROAT: The oropharynx  is clear. The oral mucosa is pink and moist. Hearing is intact to voice. LYMPH NODES:  Lymph nodes in the neck are normal. RESPIRATORY:  Lungs are clear. There is normal respiratory effort, with equal breath sounds bilaterally, and without pathologic use of accessory muscles. CARDIOVASCULAR: Heart is regular without murmurs, gallops, or rubs. GI: The abdomen is soft, mildly tender to deep palpation in all quadrants but without rebound or guarding, and nondistended. There are no palpable masses but there is a well-healed midline incision from his prior surgeries. There is no hepatosplenomegaly. There are normal bowel sounds in all quadrants. GU: Rectal deferred.   MUSCULOSKELETAL: Normal muscle strength and tone. No cyanosis or edema.   SKIN: Turgor is good and there are no pathologic skin lesions or ulcers. NEUROLOGIC: Motor and sensation is grossly normal. Cranial nerves are grossly intact. PSYCH:  Oriented to person, place and time. Affect is normal.  Data Reviewed Images and labs reviewed. Labs are primarily within normal limits with a normal white blood cell count 8.4, normal H&H of 14/41, normal platelets at 245. The only abnormalities are in calcium of 8.6, albumin 3.4, ALT of 12. Everything else is within normal limits. CT scan of the abdomen does show multiple dilated loops of small bowel proximally with decompressed small bowel distally. There is no obvious free air or free fluid. No obvious mass or tumor seen on this noncontrasted study. I have personally reviewed the patient's imaging, laboratory findings and medical records.    Assessment  Small bowel obstruction    Plan    51 year old male with a clinical small bowel obstruction. This is likely related to his ruptured appendicitis from 4 years ago. Had a long conversation with the patient about the diagnosis and treatment options. Discussed the likelihood of recovery with bowel rest and IV hydration. Patient voiced understanding  and agrees for admission. Plan to bring in the hospital for bowel rest, IV hydration, as needed medications. As patient has a documented history of HIV and uncontrolled hypertension we will ask internal medicine to consult on this patient to assist in reestablishing care and medical management of hypertension. Patient currently desires to not have an NG tube placed unless absolutely necessary. Discussed that should he have an additional episode of nausea and vomiting that an NG tube would be ordered. He voiced understanding and agrees to this plan.     Time spent with the patient was 50 minutes, with more than 50% of the time spent in face-to-face education, counseling and care coordination.     Ricarda Frame, MD FACS General Surgeon 01/04/2017, 5:42 AM

## 2017-01-04 NOTE — Consult Note (Signed)
[facility]ation  Rivka Barbaradward J Norkus ZOX:[id number] DOB: 09-30-1965 DOA: 01/04/2017 PCP: Patient, No Pcp Per   Requesting physician:  Ricarda Frameharles Woodham MD Date of consultation:  01/04/2017 Reason for consultation: elevated bp CHIEF COMPLAINT:   Chief Complaint  Patient presents with  . Abdominal Pain  . Emesis    HISTORY OF PRESENT ILLNESS: Manuel Rosales  is a 51 y.o. male with a known history of Dermatitis, HIV was admitted with small bowel obstruction per surgery. His blood pressure is noticed to be elevated. Patient reports that he has no history of high blood pressure. Last time he was seen by the infectious disease doctor for his HIV was 2 years ago. Currently he reports that his abdominal pain is improved. He complains of itching in his legs and hand with a rash which is chronic.  PAST MEDICAL HISTORY:   Past Medical History:  Diagnosis Date  . Dermatitis   . HIV (human immunodeficiency virus infection) (HCC)     PAST SURGICAL HISTORY: Past Surgical History:  Procedure Laterality Date  . APPENDECTOMY      SOCIAL HISTORY:  Social History  Substance Use Topics  . Smoking status: Current Every Day Smoker  . Smokeless tobacco: Never Used  . Alcohol use Yes    FAMILY HISTORY:  Family History  Problem Relation Age of Onset  . Cerebral aneurysm Brother        died    DRUG ALLERGIES: No Known Allergies  REVIEW OF SYSTEMS:   CONSTITUTIONAL: No fever, fatigue or weakness.  EYES: No blurred or double vision.  EARS, NOSE, AND THROAT: No tinnitus or ear pain.  RESPIRATORY: No cough, shortness of breath, wheezing or hemoptysis.  CARDIOVASCULAR: No chest pain, orthopnea, edema.  GASTROINTESTINAL: Abdominal pain mostly resolved. No nausea vomiting GENITOURINARY: No dysuria, hematuria.  ENDOCRINE: No polyuria, nocturia,  HEMATOLOGY: No anemia, easy bruising or bleeding SKIN: Chronic eczema. Scaling of his skin on his  legs and hands MUSCULOSKELETAL: No joint pain or arthritis.   NEUROLOGIC: No tingling, numbness, weakness.  PSYCHIATRY: No anxiety or depression.   MEDICATIONS AT HOME:  Prior to Admission medications   Not on File      PHYSICAL EXAMINATION:   VITAL SIGNS: Blood pressure (!) 150/97, pulse 96, temperature 97 F (36.1 C), temperature source Oral, resp. rate 16, height 6\' 1"  (1.854 m), weight 170 lb (77.1 kg), SpO2 99 %.  GENERAL:  51 y.o.-year-old patient lying in the bed with no acute distress.  EYES: Pupils equal, round, reactive to light and accommodation. No scleral icterus. Extraocular muscles intact.  HEENT: Head atraumatic, normocephalic. Oropharynx and nasopharynx clear.  NECK:  Supple, no jugular venous distention. No thyroid enlargement, no tenderness.  LUNGS: Normal breath sounds bilaterally, no wheezing, rales,rhonchi or crepitation. No use of accessory muscles of respiration.  CARDIOVASCULAR: S1, S2 normal. No murmurs, rubs, or gallops.  ABDOMEN: Soft, nontender, nondistended. Bowel sounds present. No organomegaly or mass.  EXTREMITIES: No pedal edema, cyanosis, or clubbing.  NEUROLOGIC: Cranial nerves II through XII are intact. Muscle strength 5/5 in all extremities. Sensation intact. Gait not checked.  PSYCHIATRIC: The patient is alert and oriented x 3.  SKIN: No obvious rash, lesion, or ulcer.   LABORATORY PANEL:   CBC  Recent Labs Lab 01/04/17 0201  WBC 8.4  HGB 14.0  HCT 40.9  PLT 245  MCV 90.8  MCH 31.0  MCHC 34.2  RDW 13.2  LYMPHSABS 0.4*  MONOABS 0.2  EOSABS 0.1  BASOSABS 0.0   ------------------------------------------------------------------------------------------------------------------  Chemistries   Recent Labs Lab 01/04/17 0201  NA 136  K 4.3  CL 102  CO2 22  GLUCOSE 87  BUN 13  CREATININE 1.01  CALCIUM 8.6*  AST 36  ALT 12*  ALKPHOS 73  BILITOT 0.6    ------------------------------------------------------------------------------------------------------------------ estimated creatinine clearance is 93.3 mL/min (by C-G formula based on SCr of 1.01 mg/dL). ------------------------------------------------------------------------------------------------------------------ No results for input(s): TSH, T4TOTAL, T3FREE, THYROIDAB in the last 72 hours.  Invalid input(s): FREET3   Coagulation profile No results for input(s): INR, PROTIME in the last 168 hours. ------------------------------------------------------------------------------------------------------------------- No results for input(s): DDIMER in the last 72 hours. -------------------------------------------------------------------------------------------------------------------  Cardiac Enzymes  Recent Labs Lab 01/04/17 0201  TROPONINI <0.03   ------------------------------------------------------------------------------------------------------------------ Invalid input(s): POCBNP  ---------------------------------------------------------------------------------------------------------------  Urinalysis    Component Value Date/Time   COLORURINE Amber 07/19/2013 2213   APPEARANCEUR Hazy 07/19/2013 2213   LABSPEC 1.035 07/19/2013 2213   PHURINE 6.0 07/19/2013 2213   GLUCOSEU Negative 07/19/2013 2213   HGBUR Negative 07/19/2013 2213   BILIRUBINUR Negative 07/19/2013 2213   KETONESUR Trace 07/19/2013 2213   PROTEINUR 100 mg/dL 16/05/9603 5409   NITRITE Negative 07/19/2013 2213   LEUKOCYTESUR Negative 07/19/2013 2213     RADIOLOGY: Ct Abdomen Pelvis W Contrast  Result Date: 01/04/2017 CLINICAL DATA:  Acute onset of intermittent generalized abdominal pain and vomiting. Initial encounter. EXAM: CT ABDOMEN AND PELVIS WITH CONTRAST TECHNIQUE: Multidetector CT imaging of the abdomen and pelvis was performed using the standard protocol following bolus administration of  intravenous contrast. CONTRAST:  ISOVUE-300 IOPAMIDOL (ISOVUE-300) INJECTION 61% COMPARISON:  CT of the abdomen and pelvis from 07/19/2013 FINDINGS: Lower chest: There is mild focal eventration of fat across the right hemidiaphragm. The visualized lung bases are grossly clear. The visualized portions of the mediastinum are unremarkable. Hepatobiliary: The liver is unremarkable in appearance. The gallbladder is unremarkable in appearance. The common bile duct remains normal in caliber. Pancreas: The pancreas is within normal limits. Spleen: The spleen is unremarkable in appearance. Adrenals/Urinary Tract: The adrenal glands are unremarkable in appearance. The kidneys are within normal limits. There is no evidence of hydronephrosis. No renal or ureteral stones are identified. No perinephric stranding is seen. Stomach/Bowel: The stomach is unremarkable in appearance. Diffuse distention of the ileum to 4.1 cm in maximal diameter may reflect an underlying infectious or inflammatory process, given the small amount of surrounding free fluid. There is an apparent focal transition point at the mid abdomen, about the level of the mid ileum, with mild associated wall thickening. This may reflect partial small bowel obstruction. The patient is status post appendectomy. The colon is unremarkable in appearance. Vascular/Lymphatic: Scattered calcification is seen along the abdominal aorta and its branches. The abdominal aorta is otherwise grossly unremarkable. The inferior vena cava is grossly unremarkable. No retroperitoneal lymphadenopathy is seen. No pelvic sidewall lymphadenopathy is identified. Enlarged bilateral inguinal nodes measure up to 2.3 cm in short axis. Reproductive: The bladder is moderately distended and grossly remarkable. The prostate remains normal in size. Other: No additional soft tissue abnormalities are seen. Musculoskeletal: No acute osseous abnormalities are identified. The visualized musculature is  unremarkable in appearance. IMPRESSION: 1. Diffuse distention of the ileum to 4.1 cm in maximal diameter, with a small amount of associated free fluid. Apparent focal transition point at the mid abdomen, about the level of the mid ileum, with mild associated wall thickening. This may reflect partial small bowel obstruction due to an underlying infectious  or inflammatory process. 2. Enlarged bilateral inguinal nodes, measuring up to 2.3 cm in short axis. These are of uncertain significance and would be amenable to biopsy, as deemed clinically appropriate. 3. Scattered aortic atherosclerosis. Electronically Signed   By: Roanna Raider M.D.   On: 01/04/2017 04:25    EKG: Orders placed or performed in visit on 07/20/13  . EKG 12-Lead    IMPRESSION AND PLAN: Patient is a 51 year old African-American male with history of HIV with small bowel obstruction  1. Elevated blood pressure could be related to current medical condition At this point I will place him on scheduled IV hydralazine and use IV hydralazine when necessary Once able to take oral meds can switch him to low-dose atenolol  2. HIV Check CD4 count and HIV viral load Infectious disease consult on Monday for further recommendations There is no urgency to start his HIV medications currently  3. Chronic eczema Use when necessary IV Benadryl We'll use cortisone cream as needed He needs outpatient dermatology follow-up  4. Nicotine abuse Smoking cessation provided total time spent 4 minutes recommended he stop smoking patient has a nicotine patch in place      All the records are reviewed and case discussed with ED provider. Management plans discussed with the patient, family and they are in agreement.  CODE STATUS:    Code Status Orders        Start     Ordered   01/04/17 0802  Full code  Continuous     01/04/17 0801    Code Status History    Date Active Date Inactive Code Status Order ID Comments User Context   This  patient has a current code status but no historical code status.       TOTAL TIME TAKING CARE OF THIS PATIENT:55 minutes.    Auburn Bilberry M.D on 01/04/2017 at 11:00 AM  Between 7am to 6pm - Pager - [phone]  After 6pm go to [email or web address] - password [email or web address]  [facility]  Office  [phone]  CC: Primary care physician; Patient, No Pcp Per

## 2017-01-04 NOTE — ED Triage Notes (Signed)
Patient brought in by ems from home. Patient with complaint of intermittent abdominal pain times one hour. Patient reports vomiting times two.

## 2017-01-04 NOTE — ED Provider Notes (Signed)
[facility] Emergency Department Provider Note   First MD Initiated Contact with Patient 01/04/17 0149     (approximate)  I have reviewed the triage vital signs and the nursing notes.   HISTORY  Chief Complaint Abdominal Pain and Emesis    HPI Manuel Rosales is a 51 y.o. male presents to the emergency department via EMS from home with complaint of intermittent 10 out of 10 sharp generalized abdominal pain times one hour. Patient admits to 2 episodes of nonbloody emesis as well. Patient denies any diarrhea no fever. Patient denies any urinary symptoms. Patient admits to previous history of a ruptured appendix "years ago". Patient denies any previous medical conditions.   Past medical history HIV per chart review  There are no active problems to display for this patient.   Past Surgical History:  Procedure Laterality Date  . APPENDECTOMY      Prior to Admission medications   Not on File    Allergies No known drug allergies  Family history Noncontributory  Social History Social History  Substance Use Topics  . Smoking status: Current Every Day Smoker  . Smokeless tobacco: Never Used  . Alcohol use Yes    Review of Systems Constitutional: No fever/chills Eyes: No visual changes. ENT: No sore throat. Cardiovascular: Denies chest pain. Respiratory: Denies shortness of breath. Gastrointestinal: Positive for abdominal pain and vomiting Genitourinary: Negative for dysuria. Musculoskeletal: Negative for neck pain.  Negative for back pain. Integumentary: Negative for rash. Neurological: Negative for headaches, focal weakness or numbness.   ____________________________________________   PHYSICAL EXAM:  VITAL SIGNS: ED Triage Vitals [01/04/17 0145]  Enc Vitals Group     BP (!) 189/130     Pulse Rate 99     Resp (!) 185     Temp 98 F (36.7 C)     Temp Source Oral     SpO2 100 %     Weight 77.1 kg (170 lb)     Height 1.854 m  (6\' 1" )     Head Circumference      Peak Flow      Pain Score 10     Pain Loc      Pain Edu?      Excl. in GC?     Constitutional: Alert and oriented. Well appearing and in no acute distress. Eyes: Conjunctivae are normal.  Head: Atraumatic. Mouth/Throat: Mucous membranes are moist.  Oropharynx non-erythematous. Neck: No stridor.   Cardiovascular: Normal rate, regular rhythm. Good peripheral circulation. Grossly normal heart sounds. Respiratory: Normal respiratory effort.  No retractions. Lungs CTAB. Gastrointestinal: Generalized tenderness to palpation No distention.  Musculoskeletal: No lower extremity tenderness nor edema. No gross deformities of extremities. Neurologic:  Normal speech and language. No gross focal neurologic deficits are appreciated.  Skin:  Skin is warm, dry and intact. No rash noted. Psychiatric: Mood and affect are normal. Speech and behavior are normal.  ____________________________________________   LABS (all labs ordered are listed, but only abnormal results are displayed)  Labs Reviewed  CBC WITH DIFFERENTIAL/PLATELET - Abnormal; Notable for the following:       Result Value   Neutro Abs 7.7 (*)    Lymphs Abs 0.4 (*)    All other components within normal limits  COMPREHENSIVE METABOLIC PANEL - Abnormal; Notable for the following:    Calcium 8.6 (*)    Total Protein 8.3 (*)    Albumin 3.4 (*)    ALT 12 (*)    All other  components within normal limits  ETHANOL - Abnormal; Notable for the following:    Alcohol, Ethyl (B) 36 (*)    All other components within normal limits  LIPASE, BLOOD  TROPONIN I     RADIOLOGY I, [place], personally viewed and evaluated these images (plain radiographs) as part of my medical decision making, as well as reviewing the written report by the radiologist.  Ct Abdomen Pelvis W Contrast  Result Date: 01/04/2017 CLINICAL DATA:  Acute onset of intermittent generalized abdominal pain and vomiting. Initial  encounter. EXAM: CT ABDOMEN AND PELVIS WITH CONTRAST TECHNIQUE: Multidetector CT imaging of the abdomen and pelvis was performed using the standard protocol following bolus administration of intravenous contrast. CONTRAST:  ISOVUE-300 IOPAMIDOL (ISOVUE-300) INJECTION 61% COMPARISON:  CT of the abdomen and pelvis from 07/19/2013 FINDINGS: Lower chest: There is mild focal eventration of fat across the right hemidiaphragm. The visualized lung bases are grossly clear. The visualized portions of the mediastinum are unremarkable. Hepatobiliary: The liver is unremarkable in appearance. The gallbladder is unremarkable in appearance. The common bile duct remains normal in caliber. Pancreas: The pancreas is within normal limits. Spleen: The spleen is unremarkable in appearance. Adrenals/Urinary Tract: The adrenal glands are unremarkable in appearance. The kidneys are within normal limits. There is no evidence of hydronephrosis. No renal or ureteral stones are identified. No perinephric stranding is seen. Stomach/Bowel: The stomach is unremarkable in appearance. Diffuse distention of the ileum to 4.1 cm in maximal diameter may reflect an underlying infectious or inflammatory process, given the small amount of surrounding free fluid. There is an apparent focal transition point at the mid abdomen, about the level of the mid ileum, with mild associated wall thickening. This may reflect partial small bowel obstruction. The patient is status post appendectomy. The colon is unremarkable in appearance. Vascular/Lymphatic: Scattered calcification is seen along the abdominal aorta and its branches. The abdominal aorta is otherwise grossly unremarkable. The inferior vena cava is grossly unremarkable. No retroperitoneal lymphadenopathy is seen. No pelvic sidewall lymphadenopathy is identified. Enlarged bilateral inguinal nodes measure up to 2.3 cm in short axis. Reproductive: The bladder is moderately distended and grossly  remarkable. The prostate remains normal in size. Other: No additional soft tissue abnormalities are seen. Musculoskeletal: No acute osseous abnormalities are identified. The visualized musculature is unremarkable in appearance. IMPRESSION: 1. Diffuse distention of the ileum to 4.1 cm in maximal diameter, with a small amount of associated free fluid. Apparent focal transition point at the mid abdomen, about the level of the mid ileum, with mild associated wall thickening. This may reflect partial small bowel obstruction due to an underlying infectious or inflammatory process. 2. Enlarged bilateral inguinal nodes, measuring up to 2.3 cm in short axis. These are of uncertain significance and would be amenable to biopsy, as deemed clinically appropriate. 3. Scattered aortic atherosclerosis. Electronically Signed   By: Roanna Raider M.D.   On: 01/04/2017 04:25     Procedures   ____________________________________________   INITIAL IMPRESSION / ASSESSMENT AND PLAN / ED COURSE  Pertinent labs & imaging results that were available during my care of the patient were reviewed by me and considered in my medical decision making (see chart for details).  Patient received IV morphine with pain improvement however didn't pain is not resolved. Patient given IV Zofran 85 mg 51 year old male presenting with generalized abdominal pain and vomiting. CT scan findings consistent with a small bowel obstruction. Patient discussed with Dr. Tonita Cong general surgeon on call who will  admit the patient for further evaluation and management.      ____________________________________________  FINAL CLINICAL IMPRESSION(S) / ED DIAGNOSES  Final diagnoses:  Small bowel obstruction (HCC)     MEDICATIONS GIVEN DURING THIS VISIT:  Medications  morphine 4 MG/ML injection 4 mg (4 mg Intravenous Given 01/04/17 0241)  ondansetron (ZOFRAN) injection 4 mg (4 mg Intravenous Given 01/04/17 0240)  iopamidol (ISOVUE-300) 61 %  injection 100 mL (100 mLs Intravenous Contrast Given 01/04/17 0344)     NEW OUTPATIENT MEDICATIONS STARTED DURING THIS VISIT:  New Prescriptions   No medications on file    Modified Medications   No medications on file    Discontinued Medications   No medications on file     Note:  This document was prepared using Dragon voice recognition software and may include unintentional dictation errors.    Darci Current, MD 01/04/17 [phone]

## 2017-01-05 ENCOUNTER — Inpatient Hospital Stay: Payer: Medicaid Other

## 2017-01-05 LAB — COMPREHENSIVE METABOLIC PANEL
ALK PHOS: 52 U/L (ref 38–126)
ALT: 10 U/L — ABNORMAL LOW (ref 17–63)
ANION GAP: 5 (ref 5–15)
AST: 24 U/L (ref 15–41)
Albumin: 2.6 g/dL — ABNORMAL LOW (ref 3.5–5.0)
BILIRUBIN TOTAL: 0.7 mg/dL (ref 0.3–1.2)
BUN: 13 mg/dL (ref 6–20)
CALCIUM: 8.1 mg/dL — AB (ref 8.9–10.3)
CO2: 30 mmol/L (ref 22–32)
Chloride: 104 mmol/L (ref 101–111)
Creatinine, Ser: 1.05 mg/dL (ref 0.61–1.24)
Glucose, Bld: 107 mg/dL — ABNORMAL HIGH (ref 65–99)
Potassium: 3.6 mmol/L (ref 3.5–5.1)
Sodium: 139 mmol/L (ref 135–145)
TOTAL PROTEIN: 6.8 g/dL (ref 6.5–8.1)

## 2017-01-05 LAB — CBC
HCT: 40.9 % (ref 40.0–52.0)
Hemoglobin: 13.9 g/dL (ref 13.0–18.0)
MCH: 30.8 pg (ref 26.0–34.0)
MCHC: 33.9 g/dL (ref 32.0–36.0)
MCV: 90.9 fL (ref 80.0–100.0)
PLATELETS: 233 10*3/uL (ref 150–440)
RBC: 4.5 MIL/uL (ref 4.40–5.90)
RDW: 12.9 % (ref 11.5–14.5)
WBC: 3.7 10*3/uL — AB (ref 3.8–10.6)

## 2017-01-05 LAB — MAGNESIUM: Magnesium: 2 mg/dL (ref 1.7–2.4)

## 2017-01-05 LAB — PHOSPHORUS: PHOSPHORUS: 3.4 mg/dL (ref 2.5–4.6)

## 2017-01-05 MED ORDER — OXYCODONE-ACETAMINOPHEN 5-325 MG PO TABS
1.0000 | ORAL_TABLET | Freq: Four times a day (QID) | ORAL | Status: DC | PRN
Start: 2017-01-05 — End: 2017-01-06

## 2017-01-05 MED ORDER — ATENOLOL 25 MG PO TABS
12.5000 mg | ORAL_TABLET | Freq: Every day | ORAL | Status: DC
  Administered 2017-01-05 – 2017-01-06 (×2): 12.5 mg via ORAL
  Filled 2017-01-05 (×2): qty 1

## 2017-01-05 NOTE — Progress Notes (Signed)
CC: SBO  Subjective: Passing gas, no N/V KUB personally reviewed, significant improvement bowel dilation, no free air or pneumatosis  Objective: Vital signs in last 24 hours: Temp:  [98 F (36.7 C)-98.5 F (36.9 C)] 98 F (36.7 C) (06/03 0740) Pulse Rate:  [79-93] 82 (06/03 0740) Resp:  [18-19] 19 (06/03 0740) BP: (130-157)/(77-99) 130/77 (06/03 0740) SpO2:  [97 %-100 %] 99 % (06/03 0740) Last BM Date: 01/04/17  Intake/Output from previous day: 06/02 0701 - 06/03 0700 In: 3220.2 [I.V.:3220.2] Out: 400 [Urine:400] Intake/Output this shift: Total I/O In: 258 [I.V.:258] Out: 350 [Urine:350]  Physical exam: NAD, awake and alert Abd: soft, NT, no peritonitis Ext: no edema and well perfused Neuro: GCS 15 , no motor deficits  Lab Results: CBC   Recent Labs  01/04/17 0201 01/05/17 0406  WBC 8.4 3.7*  HGB 14.0 13.9  HCT 40.9 40.9  PLT 245 233   BMET  Recent Labs  01/04/17 0201 01/05/17 0406  NA 136 139  K 4.3 3.6  CL 102 104  CO2 22 30  GLUCOSE 87 107*  BUN 13 13  CREATININE 1.01 1.05  CALCIUM 8.6* 8.1*   PT/INR No results for input(s): LABPROT, INR in the last 72 hours. ABG No results for input(s): PHART, HCO3 in the last 72 hours.  Invalid input(s): PCO2, PO2  Studies/Results: Ct Abdomen Pelvis W Contrast  Result Date: 01/04/2017 CLINICAL DATA:  Acute onset of intermittent generalized abdominal pain and vomiting. Initial encounter. EXAM: CT ABDOMEN AND PELVIS WITH CONTRAST TECHNIQUE: Multidetector CT imaging of the abdomen and pelvis was performed using the standard protocol following bolus administration of intravenous contrast. CONTRAST:  100mL ISOVUE-300 IOPAMIDOL (ISOVUE-300) INJECTION 61% COMPARISON:  CT of the abdomen and pelvis from 07/19/2013 FINDINGS: Lower chest: There is mild focal eventration of fat across the right hemidiaphragm. The visualized lung bases are grossly clear. The visualized portions of the mediastinum are unremarkable.  Hepatobiliary: The liver is unremarkable in appearance. The gallbladder is unremarkable in appearance. The common bile duct remains normal in caliber. Pancreas: The pancreas is within normal limits. Spleen: The spleen is unremarkable in appearance. Adrenals/Urinary Tract: The adrenal glands are unremarkable in appearance. The kidneys are within normal limits. There is no evidence of hydronephrosis. No renal or ureteral stones are identified. No perinephric stranding is seen. Stomach/Bowel: The stomach is unremarkable in appearance. Diffuse distention of the ileum to 4.1 cm in maximal diameter may reflect an underlying infectious or inflammatory process, given the small amount of surrounding free fluid. There is an apparent focal transition point at the mid abdomen, about the level of the mid ileum, with mild associated wall thickening. This may reflect partial small bowel obstruction. The patient is status post appendectomy. The colon is unremarkable in appearance. Vascular/Lymphatic: Scattered calcification is seen along the abdominal aorta and its branches. The abdominal aorta is otherwise grossly unremarkable. The inferior vena cava is grossly unremarkable. No retroperitoneal lymphadenopathy is seen. No pelvic sidewall lymphadenopathy is identified. Enlarged bilateral inguinal nodes measure up to 2.3 cm in short axis. Reproductive: The bladder is moderately distended and grossly remarkable. The prostate remains normal in size. Other: No additional soft tissue abnormalities are seen. Musculoskeletal: No acute osseous abnormalities are identified. The visualized musculature is unremarkable in appearance. IMPRESSION: 1. Diffuse distention of the ileum to 4.1 cm in maximal diameter, with a small amount of associated free fluid. Apparent focal transition point at the mid abdomen, about the level of the mid ileum, with mild associated wall  thickening. This may reflect partial small bowel obstruction due to an  underlying infectious or inflammatory process. 2. Enlarged bilateral inguinal nodes, measuring up to 2.3 cm in short axis. These are of uncertain significance and would be amenable to biopsy, as deemed clinically appropriate. 3. Scattered aortic atherosclerosis. Electronically Signed   By: Roanna Raider M.D.   On: 01/04/2017 04:25   Dg Abd Portable 2v  Result Date: 01/05/2017 CLINICAL DATA:  Small bowel obstruction. EXAM: PORTABLE ABDOMEN - 2 VIEW COMPARISON:  CT abdomen pelvis - 01/04/2017 FINDINGS: Nonobstructive bowel gas pattern. No pneumoperitoneum, pneumatosis or portal venous gas. Punctate phleboliths overlie the lower pelvis bilaterally, left greater than right. No definitive abnormal intra-abdominal calcifications. A small amount of excreted contrast is seen within the urinary bladder. Limited visualization of lower thorax is normal. No acute osseus abnormalities. IMPRESSION: Nonobstructive bowel gas pattern. Electronically Signed   By: Simonne Come M.D.   On: 01/05/2017 08:22    Anti-infectives: Anti-infectives    None      Assessment/Plan: Resolving partial SBO Clears and advance as tolerated Likely DC in am No surgical intervention at this time  Sterling Big, MD, FACS  01/05/2017

## 2017-01-05 NOTE — Consult Note (Signed)
[employer] -  at [facility]                                                                                                                                                                                  Patient Demographics   Manuel Rosales, is a 51 y.o. male, DOB - 05/27/1966, ZOX:[id number]  Admit date - 01/04/2017   Admitting Physician Ricarda Frame, MD  Outpatient Primary MD for the patient is Patient, No Pcp Per   LOS - 1  Subjective: Patient states that abdominal pain better Passed gas   Review of Systems:   CONSTITUTIONAL: No documented fever. No fatigue, weakness. No weight gain, no weight loss.  EYES: No blurry or double vision.  ENT: No tinnitus. No postnasal drip. No redness of the oropharynx.  RESPIRATORY: No cough, no wheeze, no hemoptysis. No dyspnea.  CARDIOVASCULAR: No chest pain. No orthopnea. No palpitations. No syncope.  GASTROINTESTINAL: No nausea, no vomiting or diarrhea. No abdominal pain. No melena or hematochezia.  GENITOURINARY: No dysuria or hematuria.  ENDOCRINE: No polyuria or nocturia. No heat or cold intolerance.  HEMATOLOGY: No anemia. No bruising. No bleeding.  INTEGUMENTARY: No rashes. No lesions.  MUSCULOSKELETAL: No arthritis. No swelling. No gout.  NEUROLOGIC: No numbness, tingling, or ataxia. No seizure-type activity.  PSYCHIATRIC: No anxiety. No insomnia. No ADD.    Vitals:   Vitals:   01/04/17 1810 01/04/17 2104 01/05/17 0206 01/05/17 0740  BP: (!) 141/92 (!) 151/87 136/81 130/77  Pulse: 93 83 79 82  Resp: 18 18 18 19   Temp: 98.5 F (36.9 C) 98.2 F (36.8 C) 98.1 F (36.7 C) 98 F (36.7 C)  TempSrc: Oral Oral Oral Oral  SpO2: 100% 97% 98% 99%  Weight:      Height:        Wt Readings from Last 3 Encounters:  01/04/17 170 lb (77.1 kg)     Intake/Output Summary (Last 24 hours) at 01/05/17 1245 Last data filed at 01/05/17 1147  Gross per 24 hour  Intake          3170.15 ml  Output              750  ml  Net          2420.15 ml    Physical Exam:   GENERAL: Pleasant-appearing in no apparent distress.  HEAD, EYES, EARS, NOSE AND THROAT: Atraumatic, normocephalic. Extraocular muscles are intact. Pupils equal and reactive to light. Sclerae anicteric. No conjunctival injection. No oro-pharyngeal erythema.  NECK: Supple. There is no jugular venous distention. No bruits, no lymphadenopathy, no thyromegaly.  HEART: Regular rate and rhythm,. No murmurs, no rubs, no clicks.  LUNGS: Clear to auscultation bilaterally. No rales or rhonchi. No wheezes.  ABDOMEN: Soft, flat, nontender, nondistended. Has good bowel sounds. No hepatosplenomegaly appreciated.  EXTREMITIES: No evidence of any cyanosis, clubbing, or peripheral edema.  +2 pedal and radial pulses bilaterally.  NEUROLOGIC: The patient is alert, awake, and oriented x3 with no focal motor or sensory deficits appreciated bilaterally.  SKIN: Moist and warm with no rashes appreciated.  Psych: Not anxious, depressed LN: No inguinal LN enlargement    Antibiotics   Anti-infectives    None      Medications   Scheduled Meds: . enoxaparin (LOVENOX) injection  40 mg Subcutaneous Q24H  . folic acid  1 mg Oral Daily  . hydrALAZINE  5 mg Intravenous Q6H  . hydrocortisone cream   Topical BID  . multivitamin with minerals  1 tablet Oral Daily  . nicotine  14 mg Transdermal Daily  . pantoprazole (PROTONIX) IV  40 mg Intravenous QHS  . thiamine  100 mg Oral Daily   Or  . thiamine  100 mg Intravenous Daily   Continuous Infusions: PRN Meds:.diphenhydrAMINE **OR** diphenhydrAMINE, hydrALAZINE, LORazepam **OR** LORazepam, morphine injection, ondansetron **OR** ondansetron (ZOFRAN) IV, oxyCODONE-acetaminophen   Data Review:   Micro Results No results found for this or any previous visit (from the past 240 hour(s)).  Radiology Reports Ct Abdomen Pelvis W Contrast  Result Date: 01/04/2017 CLINICAL DATA:  Acute onset of intermittent generalized  abdominal pain and vomiting. Initial encounter. EXAM: CT ABDOMEN AND PELVIS WITH CONTRAST TECHNIQUE: Multidetector CT imaging of the abdomen and pelvis was performed using the standard protocol following bolus administration of intravenous contrast. CONTRAST:  ISOVUE-300 IOPAMIDOL (ISOVUE-300) INJECTION 61% COMPARISON:  CT of the abdomen and pelvis from 07/19/2013 FINDINGS: Lower chest: There is mild focal eventration of fat across the right hemidiaphragm. The visualized lung bases are grossly clear. The visualized portions of the mediastinum are unremarkable. Hepatobiliary: The liver is unremarkable in appearance. The gallbladder is unremarkable in appearance. The common bile duct remains normal in caliber. Pancreas: The pancreas is within normal limits. Spleen: The spleen is unremarkable in appearance. Adrenals/Urinary Tract: The adrenal glands are unremarkable in appearance. The kidneys are within normal limits. There is no evidence of hydronephrosis. No renal or ureteral stones are identified. No perinephric stranding is seen. Stomach/Bowel: The stomach is unremarkable in appearance. Diffuse distention of the ileum to 4.1 cm in maximal diameter may reflect an underlying infectious or inflammatory process, given the small amount of surrounding free fluid. There is an apparent focal transition point at the mid abdomen, about the level of the mid ileum, with mild associated wall thickening. This may reflect partial small bowel obstruction. The patient is status post appendectomy. The colon is unremarkable in appearance. Vascular/Lymphatic: Scattered calcification is seen along the abdominal aorta and its branches. The abdominal aorta is otherwise grossly unremarkable. The inferior vena cava is grossly unremarkable. No retroperitoneal lymphadenopathy is seen. No pelvic sidewall lymphadenopathy is identified. Enlarged bilateral inguinal nodes measure up to 2.3 cm in short axis. Reproductive: The bladder is  moderately distended and grossly remarkable. The prostate remains normal in size. Other: No additional soft tissue abnormalities are seen. Musculoskeletal: No acute osseous abnormalities are identified. The visualized musculature is unremarkable in appearance. IMPRESSION: 1. Diffuse distention of the ileum to 4.1 cm in maximal diameter, with a small amount of associated free fluid. Apparent focal transition point at the mid abdomen, about the level of the mid ileum, with mild associated wall thickening. This may  reflect partial small bowel obstruction due to an underlying infectious or inflammatory process. 2. Enlarged bilateral inguinal nodes, measuring up to 2.3 cm in short axis. These are of uncertain significance and would be amenable to biopsy, as deemed clinically appropriate. 3. Scattered aortic atherosclerosis. Electronically Signed   By: Roanna RaiderJeffery  Chang M.D.   On: 01/04/2017 04:25   Dg Abd Portable 2v  Result Date: 01/05/2017 CLINICAL DATA:  Small bowel obstruction. EXAM: PORTABLE ABDOMEN - 2 VIEW COMPARISON:  CT abdomen pelvis - 01/04/2017 FINDINGS: Nonobstructive bowel gas pattern. No pneumoperitoneum, pneumatosis or portal venous gas. Punctate phleboliths overlie the lower pelvis bilaterally, left greater than right. No definitive abnormal intra-abdominal calcifications. A small amount of excreted contrast is seen within the urinary bladder. Limited visualization of lower thorax is normal. No acute osseus abnormalities. IMPRESSION: Nonobstructive bowel gas pattern. Electronically Signed   By: Simonne ComeJohn  Watts M.D.   On: 01/05/2017 08:22     CBC  Recent Labs Lab 01/04/17 0201 01/05/17 0406  WBC 8.4 3.7*  HGB 14.0 13.9  HCT 40.9 40.9  PLT 245 233  MCV 90.8 90.9  MCH 31.0 30.8  MCHC 34.2 33.9  RDW 13.2 12.9  LYMPHSABS 0.4*  --   MONOABS 0.2  --   EOSABS 0.1  --   BASOSABS 0.0  --     Chemistries   Recent Labs Lab 01/04/17 0201 01/05/17 0406  NA 136 139  K 4.3 3.6  CL 102 104   CO2 22 30  GLUCOSE 87 107*  BUN 13 13  CREATININE 1.01 1.05  CALCIUM 8.6* 8.1*  MG  --  2.0  AST 36 24  ALT 12* 10*  ALKPHOS 73 52  BILITOT 0.6 0.7   ------------------------------------------------------------------------------------------------------------------ estimated creatinine clearance is 89.7 mL/min (by C-G formula based on SCr of 1.05 mg/dL). ------------------------------------------------------------------------------------------------------------------ No results for input(s): HGBA1C in the last 72 hours. ------------------------------------------------------------------------------------------------------------------ No results for input(s): CHOL, HDL, LDLCALC, TRIG, CHOLHDL, LDLDIRECT in the last 72 hours. ------------------------------------------------------------------------------------------------------------------ No results for input(s): TSH, T4TOTAL, T3FREE, THYROIDAB in the last 72 hours.  Invalid input(s): FREET3 ------------------------------------------------------------------------------------------------------------------ No results for input(s): VITAMINB12, FOLATE, FERRITIN, TIBC, IRON, RETICCTPCT in the last 72 hours.  Coagulation profile No results for input(s): INR, PROTIME in the last 168 hours.  No results for input(s): DDIMER in the last 72 hours.  Cardiac Enzymes  Recent Labs Lab 01/04/17 0201  TROPONINI <0.03   ------------------------------------------------------------------------------------------------------------------ Invalid input(s): POCBNP    Assessment & Plan  IMPRESSION AND PLAN: Patient is a 51 year old African-American male with history of HIV with small bowel obstruction  1. Elevated blood pressure could be related to current medical condition Discontinue IV hydralazine since able to take oral I will start him on a low-dose atenolol  2. HIV CD4 count and HIV viral load pending Patient needs follow-up with ID as  outpatient There is no urgency to start his HIV medications currently  3. Chronic eczema Benadryl when necessary  We'll use cortisone cream as needed He needs outpatient dermatology follow-up  4. Nicotine abuse Smoking cessation provided yesterday      Code Status Orders        Start     Ordered   01/04/17 0802  Full code  Continuous     01/04/17 0801    Code Status History    Date Active Date Inactive Code Status Order ID Comments User Context   This patient has a current code status but no historical code status.  DVT Prophylaxis SCDs  Lab Results  Component Value Date   PLT 233 01/05/2017     Time Spent in minutes Greater than 50% of time spent in care coordination and counseling patient regarding the condition and plan of care.   Auburn Bilberry M.D on 01/05/2017 at 12:45 PM  Between 7am to 6pm - Pager - [phone]  After 6pm go to [email or web address] - password [email or web address] [facility]  [facility]   Office  [phone]

## 2017-01-06 LAB — HELPER T-LYMPH-CD4 (ARMC ONLY)
% CD 4 Pos. Lymph.: 19 % — ABNORMAL LOW (ref 30.8–58.5)
Absolute CD 4 Helper: 114 /uL — ABNORMAL LOW (ref 359–1519)
BASOS ABS: 0 10*3/uL (ref 0.0–0.2)
BASOS: 1 %
EOS (ABSOLUTE): 0.1 10*3/uL (ref 0.0–0.4)
EOS: 2 %
HEMOGLOBIN: 14.3 g/dL (ref 13.0–17.7)
Hematocrit: 43.3 % (ref 37.5–51.0)
IMMATURE GRANULOCYTES: 0 %
Immature Grans (Abs): 0 10*3/uL (ref 0.0–0.1)
LYMPHS ABS: 0.6 10*3/uL — AB (ref 0.7–3.1)
Lymphs: 16 %
MCH: 30.6 pg (ref 26.6–33.0)
MCHC: 33 g/dL (ref 31.5–35.7)
MCV: 93 fL (ref 79–97)
MONOCYTES: 12 %
MONOS ABS: 0.5 10*3/uL (ref 0.1–0.9)
NEUTROS PCT: 69 %
Neutrophils Absolute: 2.7 10*3/uL (ref 1.4–7.0)
Platelets: 281 10*3/uL (ref 150–379)
RBC: 4.68 x10E6/uL (ref 4.14–5.80)
RDW: 13 % (ref 12.3–15.4)
WBC: 3.9 10*3/uL (ref 3.4–10.8)

## 2017-01-06 MED ORDER — ATENOLOL 25 MG PO TABS: 25.0000 mg | ORAL_TABLET | Freq: Every day | ORAL | 0 refills | Status: DC

## 2017-01-06 MED ORDER — HYDROCHLOROTHIAZIDE 25 MG PO TABS
25.0000 mg | ORAL_TABLET | Freq: Every day | ORAL | Status: DC
  Administered 2017-01-06: 25 mg via ORAL
  Filled 2017-01-06: qty 1

## 2017-01-06 MED ORDER — HYDROCHLOROTHIAZIDE 25 MG PO TABS: 25.0000 mg | ORAL_TABLET | Freq: Every day | ORAL | 0 refills | Status: DC

## 2017-01-06 NOTE — Care Management (Signed)
Patient to discharge home today.  RNCM consult for transportation/fiancial issues.  Patient states that he lives with his mother.  Patient states that he does not have his license.  Patient states that his mother, nephew, brother, and sister all have transportation, and are able to drive him when needed.  Patient states that he previously utilized Medicaid transportation and the [employer] bus, but has chosen not to.  Patient states that when he was obtaining his medications they were $3 each, but he has chosen not to them.  Patient states, "I just haven't been taking care of myself like I should".  Patient has a PCP- Elly ModenaGlen Raven listed on his Medicaid Card, but states that he does not follow up with them.

## 2017-01-06 NOTE — Progress Notes (Signed)
Per MD, Okay to leave IV out.

## 2017-01-07 LAB — HIV ANTIBODY (ROUTINE TESTING W REFLEX): HIV SCREEN 4TH GENERATION: REACTIVE — AB

## 2017-01-07 LAB — HIV 1/2 AB DIFFERENTIATION
HIV 1 Ab: POSITIVE — AB
HIV 2 Ab: UNDETERMINED

## 2017-01-08 ENCOUNTER — Telehealth: Payer: Self-pay

## 2017-01-08 LAB — HIV-1 RNA, QUALITATIVE, TMA: HIV-1 RNA, QUAL: POSITIVE — AB

## 2017-01-08 NOTE — Telephone Encounter (Signed)
Hospital Follow-up call made to patient at this time. Left message for patient to call back if he has any questions or concerns since being discharged from the hospital on 6/2. He was seen by Dr. Tonita CongWoodham for abdominal pain. Currently doesn't have a follow up appointment but he can make one if he feels that he needs to come in.

## 2017-01-09 NOTE — Discharge Summary (Signed)
Physician Discharge Summary  Patient ID: Rivka Barbaradward J Emmerich MRN: [id number] DOB/AGE: [birth year] 51 y.o.  Admit date: 01/04/2017 Discharge date: 01/09/2017  Admission Diagnoses:  Discharge Diagnoses:  Active Problems:   SBO (small bowel obstruction) (HCC)   Discharged Condition: good  Hospital Course: 51 year old HIV+ Male s/p open appendectomy for perforated appendicitis 4 years ago [facility] ED with abdominal pain x 1 day, preceded by multiple loose BM's. CT demonstrated SBO, and patient was admitted to surgical service for rehydration, bowel rest, and nasogastric decompression if needed (which ultimately was not required). SBO resolved both on KUB and clinically with patient passing flatus, tolerating PO nutrition/hydration, and resolution of abdominal pain, and discharge planning was initiated accordingly.  Consults: None  Significant Diagnostic Studies: radiology: KUB: Improved dilation, resolving SBO and CT scan: SBO  Treatments: IV hydration and bowel rest  Discharge Exam: Blood pressure (!) 162/89, pulse 89, temperature 98.5 F (36.9 C), temperature source Oral, resp. rate 19, height 6\' 1"  (1.854 m), weight 170 lb (77.1 kg), SpO2 99 %. General appearance: alert, cooperative and no distress GI: soft, non-tender; bowel sounds normal; no masses,  no organomegaly  Disposition: 01-Home or Self Care   Allergies as of 01/06/2017   No Known Allergies     Medication List    TAKE these medications   atenolol 25 MG tablet Commonly known as:  TENORMIN Take 1 tablet (25 mg total) by mouth daily.   hydrochlorothiazide 25 MG tablet Commonly known as:  HYDRODIURIL Take 1 tablet (25 mg total) by mouth daily.      Follow-up Information    unc infectious disease On 01/31/2017.   Why:  @ 9 am.       [facility] [place] Follow up.   Specialty:  General Surgery Why:  No need to schedule surgery follow-up, but do not hestitate to call our office if any  questions regarding bowel obstruction or surgery. Contact information: [address] [id number] [place] [id number] [phone]          Signed: Ancil LinseyJason Evan Andreka Stucki 01/09/2017, 12:14 AM

## 2017-01-10 ENCOUNTER — Encounter: Payer: Self-pay | Admitting: Emergency Medicine

## 2017-01-10 ENCOUNTER — Emergency Department: Payer: Medicaid Other

## 2017-01-10 ENCOUNTER — Emergency Department
Admission: EM | Admit: 2017-01-10 | Discharge: 2017-01-10 | Disposition: A | Discharge status: Home / Self Care | Payer: Medicaid Other | Attending: Emergency Medicine | Admitting: Emergency Medicine

## 2017-01-10 DIAGNOSIS — X509XXA Other and unspecified overexertion or strenuous movements or postures, initial encounter: Secondary | ICD-10-CM | POA: Insufficient documentation

## 2017-01-10 DIAGNOSIS — I10 Essential (primary) hypertension: Secondary | ICD-10-CM | POA: Diagnosis not present

## 2017-01-10 DIAGNOSIS — Y939 Activity, unspecified: Secondary | ICD-10-CM | POA: Diagnosis not present

## 2017-01-10 DIAGNOSIS — Z79899 Other long term (current) drug therapy: Secondary | ICD-10-CM | POA: Insufficient documentation

## 2017-01-10 DIAGNOSIS — S39012A Strain of muscle, fascia and tendon of lower back, initial encounter: Secondary | ICD-10-CM | POA: Diagnosis not present

## 2017-01-10 DIAGNOSIS — R109 Unspecified abdominal pain: Secondary | ICD-10-CM | POA: Diagnosis present

## 2017-01-10 DIAGNOSIS — Y929 Unspecified place or not applicable: Secondary | ICD-10-CM | POA: Insufficient documentation

## 2017-01-10 DIAGNOSIS — Y999 Unspecified external cause status: Secondary | ICD-10-CM | POA: Insufficient documentation

## 2017-01-10 HISTORY — DX: Essential (primary) hypertension: I10

## 2017-01-10 LAB — BASIC METABOLIC PANEL
ANION GAP: 9 (ref 5–15)
BUN: 20 mg/dL (ref 6–20)
CALCIUM: 9 mg/dL (ref 8.9–10.3)
CO2: 27 mmol/L (ref 22–32)
Chloride: 95 mmol/L — ABNORMAL LOW (ref 101–111)
Creatinine, Ser: 1.1 mg/dL (ref 0.61–1.24)
GFR calc Af Amer: >60 mL/min (ref >60)
Glucose, Bld: 141 mg/dL — ABNORMAL HIGH (ref 65–99)
POTASSIUM: 3.6 mmol/L (ref 3.5–5.1)
SODIUM: 131 mmol/L — AB (ref 135–145)

## 2017-01-10 LAB — URINALYSIS, COMPLETE (UACMP) WITH MICROSCOPIC
BACTERIA UA: NONE SEEN
Bilirubin Urine: NEGATIVE
Glucose, UA: NEGATIVE mg/dL
Hgb urine dipstick: NEGATIVE
KETONES UR: NEGATIVE mg/dL
Leukocytes, UA: NEGATIVE
Nitrite: NEGATIVE
PROTEIN: 30 mg/dL — AB
RBC / HPF: NONE SEEN RBC/hpf (ref 0–5)
Specific Gravity, Urine: 1.023 (ref 1.005–1.030)
Squamous Epithelial / LPF: NONE SEEN
pH: 7 (ref 5.0–8.0)

## 2017-01-10 LAB — CBC
HEMATOCRIT: 41.2 % (ref 40.0–52.0)
HEMOGLOBIN: 14.3 g/dL (ref 13.0–18.0)
MCH: 31.1 pg (ref 26.0–34.0)
MCHC: 34.7 g/dL (ref 32.0–36.0)
MCV: 89.5 fL (ref 80.0–100.0)
Platelets: 296 10*3/uL (ref 150–440)
RBC: 4.61 MIL/uL (ref 4.40–5.90)
RDW: 13 % (ref 11.5–14.5)
WBC: 7.8 10*3/uL (ref 3.8–10.6)

## 2017-01-10 MED ORDER — NAPROXEN 500 MG PO TABS: 500.0000 mg | ORAL_TABLET | Freq: Two times a day (BID) | ORAL | 0 refills | Status: DC

## 2017-01-10 MED ORDER — IOPAMIDOL (ISOVUE-300) INJECTION 61%
100.0000 mL | Freq: Once | INTRAVENOUS | Status: AC | PRN
  Administered 2017-01-10: 100 mL via INTRAVENOUS

## 2017-01-10 MED ORDER — OXYCODONE-ACETAMINOPHEN 5-325 MG PO TABS
1.0000 | ORAL_TABLET | Freq: Once | ORAL | Status: AC
  Administered 2017-01-10: 1 via ORAL
  Filled 2017-01-10: qty 1

## 2017-01-10 MED ORDER — KETOROLAC TROMETHAMINE 30 MG/ML IJ SOLN
15.0000 mg | INTRAMUSCULAR | Status: AC
  Administered 2017-01-10: 15 mg via INTRAVENOUS
  Filled 2017-01-10: qty 1

## 2017-01-10 MED ORDER — SODIUM CHLORIDE 0.9 % IV BOLUS (SEPSIS)
1000.0000 mL | Freq: Once | INTRAVENOUS | Status: AC
  Administered 2017-01-10: 1000 mL via INTRAVENOUS

## 2017-01-10 MED ORDER — DIAZEPAM 5 MG PO TABS: 5.0000 mg | ORAL_TABLET | Freq: Three times a day (TID) | ORAL | 0 refills | Status: DC | PRN

## 2017-01-10 MED ORDER — IOPAMIDOL (ISOVUE-300) INJECTION 61%
30.0000 mL | Freq: Once | INTRAVENOUS | Status: AC | PRN
  Administered 2017-01-10: 30 mL via ORAL

## 2017-01-10 NOTE — ED Notes (Signed)
Pt given water and graham crackers 

## 2017-01-10 NOTE — ED Provider Notes (Signed)
[facility] Emergency Department Provider Note  ____________________________________________  Time seen: Approximately 1:23 PM  I have reviewed the triage vital signs and the nursing notes.   HISTORY  Chief Complaint Flank Pain  Level 5 caveat:  Portions of the history and physical were unable to be obtained due to the patient's poor historian  HPI Manuel Rosales is a 51 y.o. male who complains of rapid onset of right flank pain that started last night. He reports he is unable to find a comfortable position. Radiates from the right lower quadrant to the right flank. No pain in the groin. No dysuria frequency urgency or hematuria. No vomiting or diarrhea or constipation, last bowel movement was last night. No fever or chills. States it feels like his pain that he had in the hospital a week ago. Chart review shows that he was admitted for small bowel obstruction. Pain is constant, waxing and waning, moderate intensity, no aggravating or alleviating factors.    Past Medical History:  Diagnosis Date  . Dermatitis   . HIV (human immunodeficiency virus infection) (HCC)   . Hypertension      Patient Active Problem List   Diagnosis Date Noted  . SBO (small bowel obstruction) (HCC) 01/04/2017     Past Surgical History:  Procedure Laterality Date  . APPENDECTOMY       Prior to Admission medications   Medication Sig Start Date End Date Taking? Authorizing Provider  atenolol (TENORMIN) 25 MG tablet Take 1 tablet (25 mg total) by mouth daily. 01/06/17  Yes Auburn BilberryPatel, Shreyang, MD  hydrochlorothiazide (HYDRODIURIL) 25 MG tablet Take 1 tablet (25 mg total) by mouth daily. 01/06/17  Yes Auburn BilberryPatel, Shreyang, MD  diazepam (VALIUM) 5 MG tablet Take 1 tablet (5 mg total) by mouth every 8 (eight) hours as needed for muscle spasms. 01/10/17   Sharman CheekStafford, Dariusz Brase, MD  naproxen (NAPROSYN) 500 MG tablet Take 1 tablet (500 mg total) by mouth 2 (two) times daily with a meal. 01/10/17    Sharman CheekStafford, Stepen Prins, MD     Allergies Patient has no known allergies.   Family History  Problem Relation Age of Onset  . Cerebral aneurysm Brother        died    Social History Social History  Substance Use Topics  . Smoking status: Current Every Day Smoker    Packs/day: 0.50    Types: Cigarettes  . Smokeless tobacco: Never Used  . Alcohol use Yes    Review of Systems  Constitutional:   No fever or chills.  ENT:   No sore throat. No rhinorrhea. Cardiovascular:   No chest pain or syncope. Respiratory:   No dyspnea or cough. Gastrointestinal:   Positive as above for abdominal pain without vomiting and diarrhea.  Musculoskeletal:   Negative for focal pain or swelling All other systems reviewed and are negative except as documented above in ROS and HPI.  ____________________________________________   PHYSICAL EXAM:  VITAL SIGNS: ED Triage Vitals  Enc Vitals Group     BP 01/10/17 1222 106/90     Pulse Rate 01/10/17 1222 87     Resp 01/10/17 1222 18     Temp 01/10/17 1222 98.3 F (36.8 C)     Temp Source 01/10/17 1222 Oral     SpO2 01/10/17 1222 99 %     Weight 01/10/17 1222 170 lb (77.1 kg)     Height 01/10/17 1222 6\' 1"  (1.854 m)     Head Circumference --  Peak Flow --      Pain Score 01/10/17 1221 10     Pain Loc --      Pain Edu? --      Excl. in GC? --     Vital signs reviewed, nursing assessments reviewed.   Constitutional:   Alert and oriented. Well appearing and in no distress. Eyes:   No scleral icterus.  EOMI. No nystagmus. No conjunctival pallor. PERRL. ENT   Head:   Normocephalic and atraumatic.   Nose:   No congestion/rhinnorhea.    Mouth/Throat:   MMM, no pharyngeal erythema. No peritonsillar mass.    Neck:   No meningismus. Full ROM Hematological/Lymphatic/Immunilogical:   No cervical lymphadenopathy. Cardiovascular:   RRR. Symmetric bilateral radial and DP pulses.  No murmurs.  Respiratory:   Normal respiratory effort  without tachypnea/retractions. Breath sounds are clear and equal bilaterally. No wheezes/rales/rhonchi. Gastrointestinal:   Soft With generalized nonfocal tenderness. Non distended. There is no CVA tenderness.  No rebound, rigidity, or guarding. Genitourinary:   deferred Musculoskeletal:   Normal range of motion in all extremities. No joint effusions.  No lower extremity tenderness.  No edema. Neurologic:   Normal speech and language.  Motor grossly intact. No gross focal neurologic deficits are appreciated.  Skin:    Skin is warm, dry and intact. No rash noted.  No petechiae, purpura, or bullae.  ____________________________________________    LABS (pertinent positives/negatives) (all labs ordered are listed, but only abnormal results are displayed) Labs Reviewed  URINALYSIS, COMPLETE (UACMP) WITH MICROSCOPIC - Abnormal; Notable for the following:       Result Value   Color, Urine YELLOW (*)    APPearance CLEAR (*)    Protein, ur 30 (*)    All other components within normal limits  BASIC METABOLIC PANEL - Abnormal; Notable for the following:    Sodium 131 (*)    Chloride 95 (*)    Glucose, Bld 141 (*)    All other components within normal limits  CBC   ____________________________________________   EKG    ____________________________________________    RADIOLOGY  Ct Abdomen Pelvis W Contrast  Result Date: 01/10/2017 CLINICAL DATA:  Generalized abdominal and right flank pain since last night. HIV. Prior appendectomy. Recent hospitalization for small bowel obstruction . EXAM: CT ABDOMEN AND PELVIS WITH CONTRAST TECHNIQUE: Multidetector CT imaging of the abdomen and pelvis was performed using the standard protocol following bolus administration of intravenous contrast. CONTRAST:  ISOVUE-300 IOPAMIDOL (ISOVUE-300) INJECTION 61% COMPARISON:  01/04/2017 CT abdomen/pelvis. 01/05/2017 abdominal radiographs. FINDINGS: Lower chest: No significant pulmonary nodules or acute  consolidative airspace disease. 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 Hepatobiliary: Normal liver with no liver mass. Normal gallbladder with no radiopaque cholelithiasis. No biliary ductal dilatation. Pancreas: Normal, with no mass or duct dilation. Spleen: Normal size. No mass. Adrenals/Urinary Tract: Stable adrenal glands without discrete adrenal nodules. Normal kidneys with no hydronephrosis and no renal mass. Normal bladder. Stomach/Bowel: Grossly normal stomach. Normal caliber small bowel with no small bowel wall thickening. Status post appendectomy. Normal large bowel with no diverticulosis, large bowel wall thickening or pericolonic fat stranding. Oral contrast progresses to the proximal sigmoid colon. Vascular/Lymphatic: Atherosclerotic nonaneurysmal abdominal aorta. Patent portal, splenic, hepatic and renal veins. Bilateral inguinal lymphadenopathy measuring up to 1.7 cm on the right (series 2/image 89) and 1.7 cm on the left (series 2/ image 87) is not appreciably changed using similar measurement technique. No new pathologically enlarged abdominopelvic nodes. Reproductive: Normal size prostate. Other: No  pneumoperitoneum, ascites or focal fluid collection. Musculoskeletal: No aggressive appearing focal osseous lesions. Minimal thoracolumbar spondylosis. IMPRESSION: 1. No acute abnormality. No evidence of bowel obstruction or acute bowel inflammation. 2. No hydronephrosis. 3. Mild bilateral inguinal lymphadenopathy is stable and probably reactive given the history of HIV. 4. Aortic atherosclerosis. Electronically Signed   By: Delbert Phenix M.D.   On: 01/10/2017 15:55    ____________________________________________   PROCEDURES Procedures  ____________________________________________   INITIAL IMPRESSION / ASSESSMENT AND PLAN / ED COURSE  Pertinent labs & imaging results that were available during my care of the patient were reviewed by me and considered in my medical decision  making (see chart for details).  Patient presents with abdominal pain in the setting of HIV with a CD4 count of 120, results relation due to small bowel obstruction arising from scar tissue associated with remote ruptured appendicitis. Patient is not septic and is hemodynamically stable and not in distress, but will need a CT of the abdomen and pelvis for further evaluation given his repeat presentation for these abdominal symptoms.   ----------------------------------------- 5:45 PM on 01/10/2017 -----------------------------------------  Workup negative. No evidence of bowel obstruction. Patient now notes that symptoms started after he lifted a laundry basket full of clothes off the floor 2 days ago. Normally he keeps it on his stressors are that he doesn't have to lean over because that's hard for him, but recently his nephew moved the basket to the floor, so when it was full he tried to pick up a heavy basket from the floor and lifted all the way up to the top of his dresser which was very difficult for him. His otherwise negative workup, I do think that his symptoms are consistent with a muscle strain and have him follow up with primary care. NSAIDs heating pad Valium for sleep.Considering the patient's symptoms, medical history, and physical examination today, I have low suspicion for cholecystitis or biliary pathology, pancreatitis, perforation or bowel obstruction, hernia, intra-abdominal abscess, AAA or dissection, volvulus or intussusception, mesenteric ischemia, or appendicitis.  No apparent complication of his HIV and low CD4 count present time. Needs follow-up with infectious disease.     ____________________________________________   FINAL CLINICAL IMPRESSION(S) / ED DIAGNOSES  Final diagnoses:  Right flank pain  Low back strain, initial encounter      New Prescriptions   DIAZEPAM (VALIUM) 5 MG TABLET    Take 1 tablet (5 mg total) by mouth every 8 (eight) hours as needed  for muscle spasms.   NAPROXEN (NAPROSYN) 500 MG TABLET    Take 1 tablet (500 mg total) by mouth 2 (two) times daily with a meal.     Portions of this note were generated with dragon dictation software. Dictation errors may occur despite best attempts at proofreading.    Sharman Cheek, MD 01/10/17 [phone]

## 2017-01-10 NOTE — ED Notes (Signed)
Pt still drinking contrast.  Reminded needs to drink as much as possible before having scan.  Patient knows to call out when finished.

## 2017-01-10 NOTE — ED Triage Notes (Signed)
Patient presents to the ED with right flank pain since yesterday evening.  Patient states he was discharged from the hospital approx. 1 week ago due to pain from scarring post appendectomy.  Patient states on Saturday he passed out but he has been feeling better for the rest of the week until last night.  Patient also reports a new, congested cough.  Patient appears uncomfortable in triage.  Area is slightly tender.  Patient denies abdominal pain, vomiting and diarrhea.  Patient denies feeling weak, dizzy or lightheaded.

## 2017-01-10 NOTE — Discharge Instructions (Signed)
It was a pleasure to take care of you today, and thank you for coming to our emergency department.  If you have any questions or concerns before leaving please ask the nurse to grab me and I'm more than happy to go through your aftercare instructions again.  If you have any concerns once you are home that you are not improving or are in fact getting worse before you can make it to your follow-up appointment, please do not hesitate to call 911 and come back for further evaluation.  You should return to the emergency room immediately if you have new or severe symptoms such as chest pain, difficulty breathing, passing out, high fever, severe pain, or unremitting vomiting.   Available test results from today are listed below.   Sharman CheekPhillip Cici Rodriges, MD  Results for orders placed or performed during the hospital encounter of 01/10/17  Urinalysis, Complete w Microscopic  Result Value Ref Range   Color, Urine YELLOW (A) YELLOW   APPearance CLEAR (A) CLEAR   Specific Gravity, Urine 1.023 1.005 - 1.030   pH 7.0 5.0 - 8.0   Glucose, UA NEGATIVE NEGATIVE mg/dL   Hgb urine dipstick NEGATIVE NEGATIVE   Bilirubin Urine NEGATIVE NEGATIVE   Ketones, ur NEGATIVE NEGATIVE mg/dL   Protein, ur 30 (A) NEGATIVE mg/dL   Nitrite NEGATIVE NEGATIVE   Leukocytes, UA NEGATIVE NEGATIVE   RBC / HPF NONE SEEN 0 - 5 RBC/hpf   WBC, UA 0-5 0 - 5 WBC/hpf   Bacteria, UA NONE SEEN NONE SEEN   Squamous Epithelial / LPF NONE SEEN NONE SEEN   Mucous PRESENT   Basic metabolic panel  Result Value Ref Range   Sodium 131 (L) 135 - 145 mmol/L   Potassium 3.6 3.5 - 5.1 mmol/L   Chloride 95 (L) 101 - 111 mmol/L   CO2 27 22 - 32 mmol/L   Glucose, Bld 141 (H) 65 - 99 mg/dL   BUN 20 6 - 20 mg/dL   Creatinine, Ser 1.611.10 0.61 - 1.24 mg/dL   Calcium 9.0 8.9 - 09.610.3 mg/dL   GFR calc non Af Amer >60 >60 mL/min   GFR calc Af Amer >60 >60 mL/min   Anion gap 9 5 - 15  CBC  Result Value Ref Range   WBC 7.8 3.8 - 10.6 K/uL   RBC 4.61 4.40  - 5.90 MIL/uL   Hemoglobin 14.3 13.0 - 18.0 g/dL   HCT 04.541.2 40.940.0 - 81.152.0 %   MCV 89.5 80.0 - 100.0 fL   MCH 31.1 26.0 - 34.0 pg   MCHC 34.7 32.0 - 36.0 g/dL   RDW 91.413.0 78.211.5 - 95.614.5 %   Platelets 296 150 - 440 K/uL   Ct Abdomen Pelvis W Contrast  Result Date: 01/10/2017 CLINICAL DATA:  Generalized abdominal and right flank pain since last night. HIV. Prior appendectomy. Recent hospitalization for small bowel obstruction . EXAM: CT ABDOMEN AND PELVIS WITH CONTRAST TECHNIQUE: Multidetector CT imaging of the abdomen and pelvis was performed using the standard protocol following bolus administration of intravenous contrast. CONTRAST:  100mL ISOVUE-300 IOPAMIDOL (ISOVUE-300) INJECTION 61% COMPARISON:  01/04/2017 CT abdomen/pelvis. 01/05/2017 abdominal radiographs. FINDINGS: Lower chest: No significant pulmonary nodules or acute consolidative airspace disease. 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 Hepatobiliary: Normal liver with no liver mass. Normal gallbladder with no radiopaque cholelithiasis. No biliary ductal dilatation. Pancreas: Normal, with no mass or duct dilation. Spleen: Normal size. No mass. Adrenals/Urinary Tract: Stable adrenal glands without discrete adrenal nodules. Normal kidneys  with no hydronephrosis and no renal mass. Normal bladder. Stomach/Bowel: Grossly normal stomach. Normal caliber small bowel with no small bowel wall thickening. Status post appendectomy. Normal large bowel with no diverticulosis, large bowel wall thickening or pericolonic fat stranding. Oral contrast progresses to the proximal sigmoid colon. Vascular/Lymphatic: Atherosclerotic nonaneurysmal abdominal aorta. Patent portal, splenic, hepatic and renal veins. Bilateral inguinal lymphadenopathy measuring up to 1.7 cm on the right (series 2/image 89) and 1.7 cm on the left (series 2/ image 87) is not appreciably changed using similar measurement technique. No new pathologically enlarged abdominopelvic nodes.  Reproductive: Normal size prostate. Other: No pneumoperitoneum, ascites or focal fluid collection. Musculoskeletal: No aggressive appearing focal osseous lesions. Minimal thoracolumbar spondylosis. IMPRESSION: 1. No acute abnormality. No evidence of bowel obstruction or acute bowel inflammation. 2. No hydronephrosis. 3. Mild bilateral inguinal lymphadenopathy is stable and probably reactive given the history of HIV. 4. Aortic atherosclerosis. Electronically Signed   By: Delbert Phenix M.D.   On: 01/10/2017 15:55   Ct Abdomen Pelvis W Contrast  Result Date: 01/04/2017 CLINICAL DATA:  Acute onset of intermittent generalized abdominal pain and vomiting. Initial encounter. EXAM: CT ABDOMEN AND PELVIS WITH CONTRAST TECHNIQUE: Multidetector CT imaging of the abdomen and pelvis was performed using the standard protocol following bolus administration of intravenous contrast. CONTRAST:  ISOVUE-300 IOPAMIDOL (ISOVUE-300) INJECTION 61% COMPARISON:  CT of the abdomen and pelvis from 07/19/2013 FINDINGS: Lower chest: There is mild focal eventration of fat across the right hemidiaphragm. The visualized lung bases are grossly clear. The visualized portions of the mediastinum are unremarkable. Hepatobiliary: The liver is unremarkable in appearance. The gallbladder is unremarkable in appearance. The common bile duct remains normal in caliber. Pancreas: The pancreas is within normal limits. Spleen: The spleen is unremarkable in appearance. Adrenals/Urinary Tract: The adrenal glands are unremarkable in appearance. The kidneys are within normal limits. There is no evidence of hydronephrosis. No renal or ureteral stones are identified. No perinephric stranding is seen. Stomach/Bowel: The stomach is unremarkable in appearance. Diffuse distention of the ileum to 4.1 cm in maximal diameter may reflect an underlying infectious or inflammatory process, given the small amount of surrounding free fluid. There is an apparent focal  transition point at the mid abdomen, about the level of the mid ileum, with mild associated wall thickening. This may reflect partial small bowel obstruction. The patient is status post appendectomy. The colon is unremarkable in appearance. Vascular/Lymphatic: Scattered calcification is seen along the abdominal aorta and its branches. The abdominal aorta is otherwise grossly unremarkable. The inferior vena cava is grossly unremarkable. No retroperitoneal lymphadenopathy is seen. No pelvic sidewall lymphadenopathy is identified. Enlarged bilateral inguinal nodes measure up to 2.3 cm in short axis. Reproductive: The bladder is moderately distended and grossly remarkable. The prostate remains normal in size. Other: No additional soft tissue abnormalities are seen. Musculoskeletal: No acute osseous abnormalities are identified. The visualized musculature is unremarkable in appearance. IMPRESSION: 1. Diffuse distention of the ileum to 4.1 cm in maximal diameter, with a small amount of associated free fluid. Apparent focal transition point at the mid abdomen, about the level of the mid ileum, with mild associated wall thickening. This may reflect partial small bowel obstruction due to an underlying infectious or inflammatory process. 2. Enlarged bilateral inguinal nodes, measuring up to 2.3 cm in short axis. These are of uncertain significance and would be amenable to biopsy, as deemed clinically appropriate. 3. Scattered aortic atherosclerosis. Electronically Signed   By: Beryle Beams.D.  On: 01/04/2017 04:25   Dg Abd Portable 2v  Result Date: 01/05/2017 CLINICAL DATA:  Small bowel obstruction. EXAM: PORTABLE ABDOMEN - 2 VIEW COMPARISON:  CT abdomen pelvis - 01/04/2017 FINDINGS: Nonobstructive bowel gas pattern. No pneumoperitoneum, pneumatosis or portal venous gas. Punctate phleboliths overlie the lower pelvis bilaterally, left greater than right. No definitive abnormal intra-abdominal calcifications. A small  amount of excreted contrast is seen within the urinary bladder. Limited visualization of lower thorax is normal. No acute osseus abnormalities. IMPRESSION: Nonobstructive bowel gas pattern. Electronically Signed   By: Simonne Come M.D.   On: 01/05/2017 08:22

## 2017-06-05 DIAGNOSIS — I5022 Chronic systolic (congestive) heart failure: Secondary | ICD-10-CM

## 2017-06-05 DIAGNOSIS — I2510 Atherosclerotic heart disease of native coronary artery without angina pectoris: Secondary | ICD-10-CM

## 2017-06-05 HISTORY — DX: Atherosclerotic heart disease of native coronary artery without angina pectoris: I25.10

## 2017-06-05 HISTORY — DX: Chronic systolic (congestive) heart failure: I50.22

## 2017-06-08 ENCOUNTER — Other Ambulatory Visit: Payer: Self-pay

## 2017-06-08 ENCOUNTER — Inpatient Hospital Stay
Admission: EM | Admit: 2017-06-08 | Discharge: 2017-06-12 | DRG: 280 | Disposition: A | Discharge status: Home / Self Care | Payer: Medicaid Other | Attending: Internal Medicine | Admitting: Internal Medicine

## 2017-06-08 ENCOUNTER — Encounter: Payer: Self-pay | Admitting: Emergency Medicine

## 2017-06-08 ENCOUNTER — Emergency Department: Payer: Medicaid Other

## 2017-06-08 DIAGNOSIS — B370 Candidal stomatitis: Secondary | ICD-10-CM | POA: Diagnosis present

## 2017-06-08 DIAGNOSIS — I130 Hypertensive heart and chronic kidney disease with heart failure and stage 1 through stage 4 chronic kidney disease, or unspecified chronic kidney disease: Secondary | ICD-10-CM | POA: Diagnosis present

## 2017-06-08 DIAGNOSIS — N179 Acute kidney failure, unspecified: Secondary | ICD-10-CM | POA: Diagnosis present

## 2017-06-08 DIAGNOSIS — I5041 Acute combined systolic (congestive) and diastolic (congestive) heart failure: Secondary | ICD-10-CM | POA: Diagnosis present

## 2017-06-08 DIAGNOSIS — R7989 Other specified abnormal findings of blood chemistry: Secondary | ICD-10-CM

## 2017-06-08 DIAGNOSIS — R110 Nausea: Secondary | ICD-10-CM | POA: Diagnosis not present

## 2017-06-08 DIAGNOSIS — I700 Atherosclerosis of aorta: Secondary | ICD-10-CM | POA: Diagnosis present

## 2017-06-08 DIAGNOSIS — R778 Other specified abnormalities of plasma proteins: Secondary | ICD-10-CM

## 2017-06-08 DIAGNOSIS — I214 Non-ST elevation (NSTEMI) myocardial infarction: Principal | ICD-10-CM

## 2017-06-08 DIAGNOSIS — Z79899 Other long term (current) drug therapy: Secondary | ICD-10-CM | POA: Diagnosis not present

## 2017-06-08 DIAGNOSIS — K6812 Psoas muscle abscess: Secondary | ICD-10-CM | POA: Diagnosis not present

## 2017-06-08 DIAGNOSIS — E8809 Other disorders of plasma-protein metabolism, not elsewhere classified: Secondary | ICD-10-CM | POA: Diagnosis present

## 2017-06-08 DIAGNOSIS — R6881 Early satiety: Secondary | ICD-10-CM

## 2017-06-08 DIAGNOSIS — R1084 Generalized abdominal pain: Secondary | ICD-10-CM | POA: Diagnosis not present

## 2017-06-08 DIAGNOSIS — N183 Chronic kidney disease, stage 3 (moderate): Secondary | ICD-10-CM | POA: Diagnosis present

## 2017-06-08 DIAGNOSIS — F17210 Nicotine dependence, cigarettes, uncomplicated: Secondary | ICD-10-CM | POA: Diagnosis present

## 2017-06-08 DIAGNOSIS — I5021 Acute systolic (congestive) heart failure: Secondary | ICD-10-CM

## 2017-06-08 DIAGNOSIS — B20 Human immunodeficiency virus [HIV] disease: Secondary | ICD-10-CM | POA: Diagnosis present

## 2017-06-08 DIAGNOSIS — R748 Abnormal levels of other serum enzymes: Secondary | ICD-10-CM | POA: Diagnosis not present

## 2017-06-08 DIAGNOSIS — I255 Ischemic cardiomyopathy: Secondary | ICD-10-CM | POA: Diagnosis present

## 2017-06-08 DIAGNOSIS — R0602 Shortness of breath: Secondary | ICD-10-CM | POA: Diagnosis present

## 2017-06-08 DIAGNOSIS — I509 Heart failure, unspecified: Secondary | ICD-10-CM

## 2017-06-08 DIAGNOSIS — I361 Nonrheumatic tricuspid (valve) insufficiency: Secondary | ICD-10-CM | POA: Diagnosis not present

## 2017-06-08 LAB — URINALYSIS, COMPLETE (UACMP) WITH MICROSCOPIC
BACTERIA UA: NONE SEEN
BILIRUBIN URINE: NEGATIVE
GLUCOSE, UA: NEGATIVE mg/dL
Hgb urine dipstick: NEGATIVE
KETONES UR: NEGATIVE mg/dL
LEUKOCYTES UA: NEGATIVE
NITRITE: NEGATIVE
PROTEIN: NEGATIVE mg/dL
RBC / HPF: NONE SEEN RBC/hpf (ref 0–5)
SQUAMOUS EPITHELIAL / LPF: NONE SEEN
Specific Gravity, Urine: 1.016 (ref 1.005–1.030)
pH: 5 (ref 5.0–8.0)

## 2017-06-08 LAB — CBC
HEMATOCRIT: 41.6 % (ref 40.0–52.0)
HEMOGLOBIN: 13.3 g/dL (ref 13.0–18.0)
MCH: 28.1 pg (ref 26.0–34.0)
MCHC: 31.9 g/dL — ABNORMAL LOW (ref 32.0–36.0)
MCV: 88 fL (ref 80.0–100.0)
Platelets: 356 10*3/uL (ref 150–440)
RBC: 4.73 MIL/uL (ref 4.40–5.90)
RDW: 18 % — ABNORMAL HIGH (ref 11.5–14.5)
WBC: 4.2 10*3/uL (ref 3.8–10.6)

## 2017-06-08 LAB — COMPREHENSIVE METABOLIC PANEL
ALBUMIN: 2.7 g/dL — AB (ref 3.5–5.0)
ALT: 30 U/L (ref 17–63)
ANION GAP: 9 (ref 5–15)
AST: 50 U/L — AB (ref 15–41)
Alkaline Phosphatase: 77 U/L (ref 38–126)
BILIRUBIN TOTAL: 0.9 mg/dL (ref 0.3–1.2)
BUN: 26 mg/dL — ABNORMAL HIGH (ref 6–20)
CO2: 26 mmol/L (ref 22–32)
Calcium: 8.5 mg/dL — ABNORMAL LOW (ref 8.9–10.3)
Chloride: 104 mmol/L (ref 101–111)
Creatinine, Ser: 1.48 mg/dL — ABNORMAL HIGH (ref 0.61–1.24)
GFR calc Af Amer: >60 mL/min (ref >60)
GFR calc non Af Amer: 52 mL/min — ABNORMAL LOW (ref >60)
GLUCOSE: 117 mg/dL — AB (ref 65–99)
POTASSIUM: 4.2 mmol/L (ref 3.5–5.1)
Sodium: 139 mmol/L (ref 135–145)
TOTAL PROTEIN: 8.1 g/dL (ref 6.5–8.1)

## 2017-06-08 LAB — TROPONIN I
TROPONIN I: 1.68 ng/mL — AB (ref <0.03)
Troponin I: 1.54 ng/mL (ref <0.03)
Troponin I: 1.66 ng/mL (ref <0.03)

## 2017-06-08 LAB — LIPASE, BLOOD: Lipase: 20 U/L (ref 11–51)

## 2017-06-08 LAB — BRAIN NATRIURETIC PEPTIDE: B NATRIURETIC PEPTIDE 5: 4154 pg/mL — AB (ref 0.0–100.0)

## 2017-06-08 MED ORDER — LOSARTAN POTASSIUM 50 MG PO TABS
50.0000 mg | ORAL_TABLET | Freq: Every day | ORAL | Status: DC
  Administered 2017-06-08 – 2017-06-09 (×2): 50 mg via ORAL
  Filled 2017-06-08 (×2): qty 1

## 2017-06-08 MED ORDER — ASPIRIN EC 81 MG PO TBEC
81.0000 mg | DELAYED_RELEASE_TABLET | Freq: Every day | ORAL | Status: DC
  Administered 2017-06-08 – 2017-06-12 (×5): 81 mg via ORAL
  Filled 2017-06-08 (×6): qty 1

## 2017-06-08 MED ORDER — MORPHINE SULFATE (PF) 2 MG/ML IV SOLN: 2.0000 mg | INTRAVENOUS | Status: DC | PRN

## 2017-06-08 MED ORDER — ONDANSETRON HCL 4 MG/2ML IJ SOLN: 4.0000 mg | Freq: Four times a day (QID) | INTRAMUSCULAR | Status: DC | PRN

## 2017-06-08 MED ORDER — HYDRALAZINE HCL 20 MG/ML IJ SOLN
10.0000 mg | INTRAMUSCULAR | Status: DC | PRN
  Administered 2017-06-08: 10 mg via INTRAVENOUS
  Filled 2017-06-08: qty 1

## 2017-06-08 MED ORDER — DIAZEPAM 5 MG PO TABS: 5.0000 mg | ORAL_TABLET | Freq: Three times a day (TID) | ORAL | Status: DC | PRN

## 2017-06-08 MED ORDER — BISACODYL 10 MG RE SUPP: 10.0000 mg | Freq: Every day | RECTAL | Status: DC | PRN

## 2017-06-08 MED ORDER — PIPERACILLIN-TAZOBACTAM 3.375 G IVPB
3.3750 g | Freq: Three times a day (TID) | INTRAVENOUS | Status: DC
  Administered 2017-06-09 – 2017-06-10 (×5): 3.375 g via INTRAVENOUS
  Filled 2017-06-08 (×5): qty 50

## 2017-06-08 MED ORDER — METOPROLOL TARTRATE 25 MG PO TABS
25.0000 mg | ORAL_TABLET | Freq: Two times a day (BID) | ORAL | Status: DC
  Administered 2017-06-08 – 2017-06-09 (×2): 25 mg via ORAL
  Filled 2017-06-08 (×2): qty 1

## 2017-06-08 MED ORDER — ONDANSETRON HCL 4 MG PO TABS: 4.0000 mg | ORAL_TABLET | Freq: Four times a day (QID) | ORAL | Status: DC | PRN

## 2017-06-08 MED ORDER — ACETAMINOPHEN 650 MG RE SUPP: 650.0000 mg | Freq: Four times a day (QID) | RECTAL | Status: DC | PRN

## 2017-06-08 MED ORDER — POTASSIUM CHLORIDE CRYS ER 20 MEQ PO TBCR
20.0000 meq | EXTENDED_RELEASE_TABLET | Freq: Two times a day (BID) | ORAL | Status: DC
  Administered 2017-06-08 – 2017-06-10 (×4): 20 meq via ORAL
  Filled 2017-06-08 (×4): qty 1

## 2017-06-08 MED ORDER — VANCOMYCIN HCL IN DEXTROSE 1-5 GM/200ML-% IV SOLN
1000.0000 mg | Freq: Once | INTRAVENOUS | Status: AC
  Administered 2017-06-08: 1000 mg via INTRAVENOUS
  Filled 2017-06-08: qty 200

## 2017-06-08 MED ORDER — IPRATROPIUM-ALBUTEROL 0.5-2.5 (3) MG/3ML IN SOLN
3.0000 mL | Freq: Four times a day (QID) | RESPIRATORY_TRACT | Status: DC
  Administered 2017-06-08 – 2017-06-09 (×2): 3 mL via RESPIRATORY_TRACT
  Filled 2017-06-08 (×3): qty 3

## 2017-06-08 MED ORDER — ACETAMINOPHEN 325 MG PO TABS
650.0000 mg | ORAL_TABLET | Freq: Four times a day (QID) | ORAL | Status: DC | PRN
  Administered 2017-06-12: 650 mg via ORAL
  Filled 2017-06-08: qty 2

## 2017-06-08 MED ORDER — FUROSEMIDE 10 MG/ML IJ SOLN
40.0000 mg | Freq: Two times a day (BID) | INTRAMUSCULAR | Status: DC
  Administered 2017-06-09 – 2017-06-10 (×3): 40 mg via INTRAVENOUS
  Filled 2017-06-08 (×3): qty 4

## 2017-06-08 MED ORDER — VANCOMYCIN HCL 10 G IV SOLR
1500.0000 mg | INTRAVENOUS | Status: DC
  Administered 2017-06-09 (×2): 1500 mg via INTRAVENOUS
  Filled 2017-06-08 (×3): qty 1500

## 2017-06-08 MED ORDER — ENOXAPARIN SODIUM 40 MG/0.4ML ~~LOC~~ SOLN
40.0000 mg | SUBCUTANEOUS | Status: DC
  Administered 2017-06-08: 40 mg via SUBCUTANEOUS
  Filled 2017-06-08: qty 0.4

## 2017-06-08 MED ORDER — IOPAMIDOL (ISOVUE-300) INJECTION 61%
100.0000 mL | Freq: Once | INTRAVENOUS | Status: AC | PRN
  Administered 2017-06-08: 100 mL via INTRAVENOUS
  Filled 2017-06-08: qty 100

## 2017-06-08 MED ORDER — PANTOPRAZOLE SODIUM 40 MG IV SOLR
40.0000 mg | Freq: Two times a day (BID) | INTRAVENOUS | Status: DC
  Administered 2017-06-08 – 2017-06-10 (×4): 40 mg via INTRAVENOUS
  Filled 2017-06-08 (×4): qty 40

## 2017-06-08 MED ORDER — NITROGLYCERIN 0.4 MG SL SUBL: 0.4000 mg | SUBLINGUAL_TABLET | SUBLINGUAL | Status: DC | PRN

## 2017-06-08 MED ORDER — FUROSEMIDE 10 MG/ML IJ SOLN
40.0000 mg | Freq: Once | INTRAMUSCULAR | Status: AC
Start: 2017-06-08 — End: 2017-06-08
  Administered 2017-06-08: 40 mg via INTRAVENOUS
  Filled 2017-06-08: qty 4

## 2017-06-08 MED ORDER — PIPERACILLIN-TAZOBACTAM 3.375 G IVPB 30 MIN
3.3750 g | Freq: Once | INTRAVENOUS | Status: AC
  Administered 2017-06-08: 3.375 g via INTRAVENOUS
  Filled 2017-06-08: qty 50

## 2017-06-08 MED ORDER — NITROGLYCERIN 2 % TD OINT
1.0000 [in_us] | TOPICAL_OINTMENT | Freq: Four times a day (QID) | TRANSDERMAL | Status: DC
  Administered 2017-06-08: 1 [in_us] via TOPICAL
  Filled 2017-06-08: qty 1

## 2017-06-08 MED ORDER — DOCUSATE SODIUM 100 MG PO CAPS
100.0000 mg | ORAL_CAPSULE | Freq: Two times a day (BID) | ORAL | Status: DC
  Administered 2017-06-08 – 2017-06-12 (×6): 100 mg via ORAL
  Filled 2017-06-08 (×6): qty 1

## 2017-06-08 NOTE — Progress Notes (Signed)
Patient admitted to unit. Oriented to room, call bell, and staff. Bed in lowest position. Fall safety plan reviewed. Full assessment to Epic. Skin assessment verified with Theone Murdocholl Ferguson, RN. Telemetry box verification with Mykia, CNA- Box#: 23. Will continue to monitor.

## 2017-06-08 NOTE — ED Notes (Addendum)
CRITICAL VALUE STICKER  CRITICAL VALUE:  RECEIVER (on-site recipient of call): Dorinda HillDonald  DATE & TIME NOTIFIED: 06/08/17 1513  MESSENGER (representative from lab):  MD NOTIFIED: [place]  TIME OF NOTIFICATION: 1513  RESPONSE: No New Immediate Orders

## 2017-06-08 NOTE — ED Notes (Signed)
Patient to triage pushed in wheelchair by visitor, patient allowed visitor to have a seat and stay in room during triage. During the triage assessment this RN reviewed patients current signs and symptoms related to chef complaint today, as well medication allergies, current medication list, past surgical, medical, and social history.  Pts visitor left the room after RN reviewed medical history. Pt stated that his medical history was confidential and that his visitor did not need to know his history. RN informed pt that as part of the triage process we have to review past medical history to ensure that our documentation is up to date as it could relate to his current visit to the emergency department.

## 2017-06-08 NOTE — H&P (Signed)
History and Physical    Manuel Rosales ZOX:[id number] DOB: 11-Dec-1965 DOA: 06/08/2017  Referring physician: Dr. Pershing Proud PCP: Patient, No Pcp Per  Specialists: none  Chief Complaint: SOB  HPI: Manuel Rosales is a 52 y.o. male has a past medical history significant for HIV and HTN off meds now with 1 week hx of progressive SOB and DOE. Sleeping in a chair. Not eating well as he gets "full" after a few bites. In ER, CXR and BNP suggest CHF. BP and troponin elevated. Has hx of SBO as well. No fever. Denies CP. No change in bowels or bladder. He is now admitted  Review of Systems: The patient denies fever, weight loss,, vision loss, decreased hearing, hoarseness, chest pain, syncope, peripheral edema, balance deficits, hemoptysis, abdominal pain, melena, hematochezia, severe indigestion/heartburn, hematuria, incontinence, genital sores, muscle weakness, suspicious skin lesions, transient blindness, difficulty walking, depression, unusual weight change, abnormal bleeding, enlarged lymph nodes, angioedema, and breast masses.   Past Medical History:  Diagnosis Date  . Dermatitis   . HIV (human immunodeficiency virus infection) (HCC)   . Hypertension    Past Surgical History:  Procedure Laterality Date  . APPENDECTOMY     Social History:  reports that he has been smoking cigarettes.  He has been smoking about 0.50 packs per day. he has never used smokeless tobacco. He reports that he drinks alcohol. He reports that he does not use drugs.  No Known Allergies  Family History  Problem Relation Age of Onset  . Cerebral aneurysm Brother        died    Prior to Admission medications   Medication Sig Start Date End Date Taking? Authorizing Provider  atenolol (TENORMIN) 25 MG tablet Take 1 tablet (25 mg total) by mouth daily. 01/06/17   Auburn Bilberry, MD  diazepam (VALIUM) 5 MG tablet Take 1 tablet (5 mg total) by mouth every 8 (eight) hours as needed for muscle spasms. 01/10/17   Sharman Cheek, MD  hydrochlorothiazide (HYDRODIURIL) 25 MG tablet Take 1 tablet (25 mg total) by mouth daily. 01/06/17   Auburn Bilberry, MD  naproxen (NAPROSYN) 500 MG tablet Take 1 tablet (500 mg total) by mouth 2 (two) times daily with a meal. 01/10/17   Sharman Cheek, MD   Physical Exam: Vitals:   06/08/17 1112 06/08/17 1118  BP: (!) 156/124   Pulse: 96   Resp: (!) 28   Temp: 97.9 F (36.6 C)   TempSrc: Oral   SpO2: 100%   Weight:  77.1 kg (170 lb)  Height:  6\' 1"  (1.854 m)     General:  No apparent distress, WDWN, [place]/AT  Eyes: PERRL, EOMI, no scleral icterus, conjunctiva clear  ENT: moist oropharynx without exudate, TM's benign, dentition fair  Neck: supple, no lymphadenopathy. No bruits or thyromegaly  Cardiovascular: regular rate without MRG; 2+ peripheral pulses, no JVD, trace peripheral edema  Respiratory: bilateral rales without wheezes or rhonchi. No dullness. Respiratory effort increased  Abdomen: soft, non tender to palpation, positive bowel sounds, no guarding, no rebound  Skin: no rashes or lesions  Musculoskeletal: normal bulk and tone, no joint swelling  Psychiatric: normal mood and affect, A&OX3  Neurologic: CN 2-12 grossly intact, Motor strength 5/5 in all 4 groups with symmetric DTR's and non-focal sensory exam  Labs on Admission:  Basic Metabolic Panel: Recent Labs  Lab 06/08/17 1119  NA 139  K 4.2  CL 104  CO2 26  GLUCOSE 117*  BUN 26*  CREATININE 1.48*  CALCIUM 8.5*   Liver Function Tests: Recent Labs  Lab 06/08/17 1119  AST 50*  ALT 30  ALKPHOS 77  BILITOT 0.9  PROT 8.1  ALBUMIN 2.7*   Recent Labs  Lab 06/08/17 1119  LIPASE 20   No results for input(s): AMMONIA in the last 168 hours. CBC: Recent Labs  Lab 06/08/17 1119  WBC 4.2  HGB 13.3  HCT 41.6  MCV 88.0  PLT 356   Cardiac Enzymes: Recent Labs  Lab 06/08/17 1127  TROPONINI 1.54*    BNP (last 3 results) Recent Labs    06/08/17 1127  BNP 4,154.0*     ProBNP (last 3 results) No results for input(s): PROBNP in the last 8760 hours.  CBG: No results for input(s): GLUCAP in the last 168 hours.  Radiological Exams on Admission: Dg Chest 2 View  Result Date: 06/08/2017 CLINICAL DATA:  Short of breath for 3 days EXAM: CHEST  2 VIEW COMPARISON:  07/19/2013 FINDINGS: The heart is moderately enlarged. Vascular congestion. Mild interstitial edema at the lung bases. No pneumothorax or pleural effusion. IMPRESSION: Mild CHF. Electronically Signed   By: Jolaine ClickArthur  Hoss M.D.   On: 06/08/2017 12:23    EKG: Independently reviewed.  Assessment/Plan Principal Problem:   CHF (congestive heart failure) (HCC) Active Problems:   Elevated troponin   Early satiety   HIV disease (HCC)   Will admit to floor with telemetry and IV Lasix with K+. Follow enzymes and order echo. Cardiology consulted. Will also consult ID for HIV and GI for early satiety. Optimize BP meds. Repeat labs in AM.  Diet: clear liquids Fluids: NS@KVO  DVT Prophylaxis: Lovenox  Code Status: FULL  Family Communication: none  Disposition Plan: home  Time spent: 50 min

## 2017-06-08 NOTE — ED Triage Notes (Signed)
Pt states that he has been short of breath for 2-3 days. Pt also states that he has been unable to eat and is having abdominal pain. Pt denies N/V/D. Pt is in NAD at this time.

## 2017-06-08 NOTE — ED Provider Notes (Signed)
[facility] Emergency Department Provider Note  ____________________________________________   First MD Initiated Contact with Patient 06/08/17 1411     (approximate)  I have reviewed the triage vital signs and the nursing notes.   HISTORY  Chief Complaint Shortness of Breath and Abdominal Pain   HPI Manuel Rosales is a 51 y.o. male patient with a history of HIV as well as hypertension and small bowel obstruction was presented to the emergency department today with 2-3 days of shortness of breath and weakness.  The patient is also complaining of diffuse abdominal pain with nausea and inability to tolerate any large amount of food.  Patient denies any radiation of his abdominal pain.  Denies any chest pain.  Patient says that his shortness of breath is worse when he has exerting himself or lying flat.  Patient says the only medication he takes occasionally now is ibuprofen.  Of note, before I saw the patient his girlfriend came over to the physician's desk where I was sitting and told me that she just found out in triage that he was HIV positive.  She says that she has been with him for 5 years and was unaware of his HIV status.  While I was in the room with the patient I asked him repeatedly if I could discuss all medical issues with a girlfriend present and he did give permission.  We discussed his HIV but the patient was not very forthcoming about details.  He says that he does not have any medicines and is unaware of the last time he was on any medications for this.  The last appointment on record with an infectious disease doctor at [facility] was in 2015.  I was unable to locate any other appointments more recently with infectious disease doctors.  The girlfriend says that she was last tested for HIV about 5-6 months ago and was negative.  She says that she has not been sexually active with the patient for about 4 months now.   Past Medical History:  Diagnosis Date  .  Dermatitis   . HIV (human immunodeficiency virus infection) (HCC)   . Hypertension     Patient Active Problem List   Diagnosis Date Noted  . Small bowel obstruction (HCC) 01/04/2017    Past Surgical History:  Procedure Laterality Date  . APPENDECTOMY      Prior to Admission medications   Medication Sig Start Date End Date Taking? Authorizing Provider  atenolol (TENORMIN) 25 MG tablet Take 1 tablet (25 mg total) by mouth daily. 01/06/17   Auburn Bilberry, MD  diazepam (VALIUM) 5 MG tablet Take 1 tablet (5 mg total) by mouth every 8 (eight) hours as needed for muscle spasms. 01/10/17   Sharman Cheek, MD  hydrochlorothiazide (HYDRODIURIL) 25 MG tablet Take 1 tablet (25 mg total) by mouth daily. 01/06/17   Auburn Bilberry, MD  naproxen (NAPROSYN) 500 MG tablet Take 1 tablet (500 mg total) by mouth 2 (two) times daily with a meal. 01/10/17   Sharman Cheek, MD    Allergies Patient has no known allergies.  Family History  Problem Relation Age of Onset  . Cerebral aneurysm Brother        died    Social History Social History   Tobacco Use  . Smoking status: Current Every Day Smoker    Packs/day: 0.50    Types: Cigarettes  . Smokeless tobacco: Never Used  Substance Use Topics  . Alcohol use: Yes  . Drug use:  No    Review of Systems Constitutional: No fever/chills Eyes: No visual changes. ENT: No sore throat. Cardiovascular: Denies chest pain. Respiratory: As above Gastrointestinal: no vomiting.  No diarrhea.  No constipation. Genitourinary: Negative for dysuria. Musculoskeletal: Negative for back pain. Skin: Negative for rash. Neurological: Negative for headaches, focal weakness or numbness.   ____________________________________________   PHYSICAL EXAM:  VITAL SIGNS: ED Triage Vitals  Enc Vitals Group     BP 06/08/17 1112 (!) 156/124     Pulse Rate 06/08/17 1112 96     Resp 06/08/17 1112 (!) 28     Temp 06/08/17 1112 97.9 F (36.6 C)     Temp Source  06/08/17 1112 Oral     SpO2 06/08/17 1112 100 %     Weight 06/08/17 1118 170 lb (77.1 kg)     Height 06/08/17 1118 6\' 1"  (1.854 m)     Head Circumference --      Peak Flow --      Pain Score 06/08/17 1118 8     Pain Loc --      Pain Edu? --      Excl. in GC? --     Constitutional: Alert and oriented. Well appearing and in no acute distress. Eyes: Conjunctivae are normal.  Head: Atraumatic. Nose: No congestion/rhinnorhea. Mouth/Throat: Mucous membranes are moist.  Neck: No stridor.   Cardiovascular: Normal rate, regular rhythm. Grossly normal heart sounds.  Good peripheral circulation. Respiratory: Normal respiratory effort.  No retractions. Lungs CTAB. Gastrointestinal: Soft with mild distention and mild diffuse tenderness palpation without any rebound or guarding. No CVA tenderness. Musculoskeletal: No lower extremity tenderness nor edema.  No joint effusions. Neurologic:  Normal speech and language. No gross focal neurologic deficits are appreciated. Skin:  Skin is warm, dry and intact. No rash noted. Psychiatric: Mood and affect are normal. Speech and behavior are normal.  ____________________________________________   LABS (all labs ordered are listed, but only abnormal results are displayed)  Labs Reviewed  COMPREHENSIVE METABOLIC PANEL - Abnormal; Notable for the following components:      Result Value   Glucose, Bld 117 (*)    BUN 26 (*)    Creatinine, Ser 1.48 (*)    Calcium 8.5 (*)    Albumin 2.7 (*)    AST 50 (*)    GFR calc non Af Amer 52 (*)    All other components within normal limits  CBC - Abnormal; Notable for the following components:   MCHC 31.9 (*)    RDW 18.0 (*)    All other components within normal limits  TROPONIN I - Abnormal; Notable for the following components:   Troponin I 1.54 (*)    All other components within normal limits  BRAIN NATRIURETIC PEPTIDE - Abnormal; Notable for the following components:   B Natriuretic Peptide 4,154.0 (*)      All other components within normal limits  LIPASE, BLOOD  URINALYSIS, COMPLETE (UACMP) WITH MICROSCOPIC   ____________________________________________  EKG  ED ECG REPORT I, Arelia LongestSchaevitz,  Dannette Kinkaid M, the attending physician, personally viewed and interpreted this ECG.   Date: 06/08/2017  EKG Time: 1126  Rate: 101  Rhythm: Sinus tachycardia  Axis: Rightward axis  Intervals:none  ST&T Change: T wave inversions in 1, 2, aVL and aVF as well as V4 through 6 which are new from previous.  ____________________________________________  RADIOLOGY  Mild CHF on the chest x-ray  Pending CT of the abdomen and pelvis ____________________________________________   PROCEDURES  Procedure(s) performed:  Procedures  Critical Care performed:  CRITICAL CARE Performed by: Arelia Longest   Total critical care time: 35 minutes  Critical care time was exclusive of separately billable procedures and treating other patients.  Critical care was necessary to treat or prevent imminent or life-threatening deterioration.  Critical care was time spent personally by me on the following activities: development of treatment plan with patient and/or surrogate as well as nursing, discussions with consultants, evaluation of patient's response to treatment, examination of patient, obtaining history from patient or surrogate, ordering and performing treatments and interventions, ordering and review of laboratory studies, ordering and review of radiographic studies, pulse oximetry and re-evaluation of patient's condition.  As part of my medical decision making, I reviewed the following data within the electronic MEDICAL RECORD NUMBER Old chart reviewed     ____________________________________________   INITIAL IMPRESSION / ASSESSMENT AND PLAN / ED COURSE  Pertinent labs & imaging results that were available during my care of the patient were reviewed by me and considered in my medical decision making  (see chart for details).  Differential diagnosis includes, but is not limited to, acute appendicitis, renal colic, testicular torsion, urinary tract infection/pyelonephritis, prostatitis,  epididymitis, diverticulitis, small bowel obstruction or ileus, colitis, abdominal aortic aneurysm, gastroenteritis, hernia, etc. Differential includes, but is not limited to, viral syndrome, bronchitis including COPD exacerbation, pneumonia, reactive airway disease including asthma, CHF including exacerbation with or without pulmonary/interstitial edema, pneumothorax, ACS, thoracic trauma, and pulmonary embolism.    As part of my medical decision making, I reviewed the following data within the electronic MEDICAL RECORD NUMBER Old chart reviewed and Notes from prior ED visits     ----------------------------------------- 3:52 PM on 06/08/2017 -----------------------------------------  Patient at this time without any further complaints.  Given IV Lasix.  Pending CAT scan.  Patient will be admitted to [facility] given shortness of breath with evidence of CHF, new EKG changes as well as a critically high troponin.  Holding heparin for now until CAT scan completed.  Patient aware of diagnosis and need for admission to the hospital.  Signed out to Dr. Judithann Sheen.  Also, the patient's girlfriend says that she will be tested at the local health department tomorrow.  She says that she will abstain from any further activity, sexually, with the patient.  ____________________________________________   FINAL CLINICAL IMPRESSION(S) / ED DIAGNOSES  Non-STEMI.  CHF.  Abdominal pain with nausea.    NEW MEDICATIONS STARTED DURING THIS VISIT:  This SmartLink is deprecated. Use AVSMEDLIST instead to display the medication list for a patient.   Note:  This document was prepared using Dragon voice recognition software and may include unintentional dictation errors.     Myrna Blazer, MD 06/08/17 [phone]

## 2017-06-08 NOTE — Progress Notes (Signed)
ANTIBIOTIC CONSULT NOTE - INITIAL  Pharmacy Consult for Zosyn and vancomycin Indication: psoas abscess and IAI  No Known Allergies  Patient Measurements: Height: 6\' 1"  (185.4 cm) Weight: 170 lb (77.1 kg) IBW/kg (Calculated) : 79.9 Adjusted Body Weight:   Vital Signs: Temp: 97.6 F (36.4 C) (11/04 1600) Temp Source: Oral (11/04 1112) BP: 161/115 (11/04 1600) Pulse Rate: 88 (11/04 1600) Intake/Output from previous day: No intake/output data recorded. Intake/Output from this shift: No intake/output data recorded.  Labs: Recent Labs    06/08/17 1119  WBC 4.2  HGB 13.3  PLT 356  CREATININE 1.48*   Estimated Creatinine Clearance: 62.9 mL/min (A) (by C-G formula based on SCr of 1.48 mg/dL (H)). No results for input(s): VANCOTROUGH, VANCOPEAK, VANCORANDOM, GENTTROUGH, GENTPEAK, GENTRANDOM, TOBRATROUGH, TOBRAPEAK, TOBRARND, AMIKACINPEAK, AMIKACINTROU, AMIKACIN in the last 72 hours.   Microbiology: No results found for this or any previous visit (from the past 720 hour(s)).  Medical History: Past Medical History:  Diagnosis Date  . Dermatitis   . HIV (human immunodeficiency virus infection) (HCC)   . Hypertension     Medications:  Scheduled:  . [START ON 06/09/2017] furosemide  40 mg Intravenous Q12H  . ipratropium-albuterol  3 mL Nebulization QID  . losartan  50 mg Oral Daily  . metoprolol tartrate  25 mg Oral BID  . nitroGLYCERIN  1 inch Topical Q6H  . pantoprazole (PROTONIX) IV  40 mg Intravenous Q12H  . potassium chloride  20 mEq Oral BID   Assessment: 51 yom cc SOB with PMH HIV, HTN. Pharmacy consulted to dose vanc and Zosyn for psoas abscess and cellulitis.  Goal of Therapy:  Resolve infection Prevent ADE Vancomycin trough 15 to 20 mcg/mL  Plan:  1. Zosyn 3.375 gm IV Q8H EI 2. Vancomycin 1 gm IV x 1 in ED followed in approximately 6 hours (stacked dosing) by vancomycin 1.5 gm IV Q18H, predicted trough 16 mcg/mL. Pharmacy will continue to follow and adjust  as needed to maintain trough 15 to 20 mcg/mL.   Vd 54 L, Ke 0.057 hr-1, T1/2 12.2 hr  Carola FrostNathan A Yazmeen Woolf, Pharm.D., BCPS Clinical Pharmacist 06/08/2017,4:47 PM

## 2017-06-09 ENCOUNTER — Inpatient Hospital Stay (HOSPITAL_COMMUNITY)
Admit: 2017-06-09 | Discharge: 2017-06-09 | Disposition: A | Discharge status: Home / Self Care | Payer: Medicaid Other | Attending: Internal Medicine | Admitting: Internal Medicine

## 2017-06-09 DIAGNOSIS — I509 Heart failure, unspecified: Secondary | ICD-10-CM

## 2017-06-09 DIAGNOSIS — R110 Nausea: Secondary | ICD-10-CM

## 2017-06-09 DIAGNOSIS — R6881 Early satiety: Secondary | ICD-10-CM

## 2017-06-09 DIAGNOSIS — B20 Human immunodeficiency virus [HIV] disease: Secondary | ICD-10-CM

## 2017-06-09 DIAGNOSIS — I214 Non-ST elevation (NSTEMI) myocardial infarction: Principal | ICD-10-CM

## 2017-06-09 DIAGNOSIS — K6812 Psoas muscle abscess: Secondary | ICD-10-CM

## 2017-06-09 DIAGNOSIS — R1084 Generalized abdominal pain: Secondary | ICD-10-CM

## 2017-06-09 DIAGNOSIS — I361 Nonrheumatic tricuspid (valve) insufficiency: Secondary | ICD-10-CM

## 2017-06-09 LAB — COMPREHENSIVE METABOLIC PANEL
ALK PHOS: 65 U/L (ref 38–126)
ALT: 27 U/L (ref 17–63)
AST: 44 U/L — AB (ref 15–41)
Albumin: 2.5 g/dL — ABNORMAL LOW (ref 3.5–5.0)
Anion gap: 8 (ref 5–15)
BUN: 23 mg/dL — AB (ref 6–20)
CHLORIDE: 104 mmol/L (ref 101–111)
CO2: 26 mmol/L (ref 22–32)
CREATININE: 1.45 mg/dL — AB (ref 0.61–1.24)
Calcium: 8 mg/dL — ABNORMAL LOW (ref 8.9–10.3)
GFR calc Af Amer: >60 mL/min (ref >60)
GFR calc non Af Amer: 54 mL/min — ABNORMAL LOW (ref >60)
GLUCOSE: 116 mg/dL — AB (ref 65–99)
Potassium: 4.2 mmol/L (ref 3.5–5.1)
SODIUM: 138 mmol/L (ref 135–145)
Total Bilirubin: 1.1 mg/dL (ref 0.3–1.2)
Total Protein: 7.4 g/dL (ref 6.5–8.1)

## 2017-06-09 LAB — HEMOGLOBIN A1C
Hgb A1c MFr Bld: 5.8 % — ABNORMAL HIGH (ref 4.8–5.6)
Mean Plasma Glucose: 119.76 mg/dL

## 2017-06-09 LAB — CBC
HCT: 41.3 % (ref 40.0–52.0)
Hemoglobin: 13.5 g/dL (ref 13.0–18.0)
MCH: 28.4 pg (ref 26.0–34.0)
MCHC: 32.6 g/dL (ref 32.0–36.0)
MCV: 87.1 fL (ref 80.0–100.0)
PLATELETS: 327 10*3/uL (ref 150–440)
RBC: 4.74 MIL/uL (ref 4.40–5.90)
RDW: 17.7 % — AB (ref 11.5–14.5)
WBC: 4.9 10*3/uL (ref 3.8–10.6)

## 2017-06-09 LAB — HEPARIN LEVEL (UNFRACTIONATED): Heparin Unfractionated: 0.22 IU/mL — ABNORMAL LOW (ref 0.30–0.70)

## 2017-06-09 LAB — LIPID PANEL
CHOL/HDL RATIO: 5.9 ratio
Cholesterol: 123 mg/dL (ref 0–200)
HDL: 21 mg/dL — ABNORMAL LOW (ref >40)
LDL CALC: 83 mg/dL (ref 0–99)
Triglycerides: 94 mg/dL (ref <150)
VLDL: 19 mg/dL (ref 0–40)

## 2017-06-09 LAB — PROTIME-INR
INR: 1.22
Prothrombin Time: 15.3 seconds — ABNORMAL HIGH (ref 11.4–15.2)

## 2017-06-09 LAB — GLUCOSE, CAPILLARY: Glucose-Capillary: 118 mg/dL — ABNORMAL HIGH (ref 65–99)

## 2017-06-09 LAB — TROPONIN I
TROPONIN I: 1.87 ng/mL — AB (ref <0.03)
Troponin I: 1.59 ng/mL (ref <0.03)

## 2017-06-09 LAB — ECHOCARDIOGRAM COMPLETE
Height: 73 in
Weight: 2499.2 oz

## 2017-06-09 LAB — APTT: aPTT: 33 seconds (ref 24–36)

## 2017-06-09 MED ORDER — IPRATROPIUM-ALBUTEROL 0.5-2.5 (3) MG/3ML IN SOLN: 3.0000 mL | Freq: Four times a day (QID) | RESPIRATORY_TRACT | Status: DC | PRN

## 2017-06-09 MED ORDER — ATORVASTATIN CALCIUM 20 MG PO TABS
40.0000 mg | ORAL_TABLET | Freq: Every day | ORAL | Status: DC
  Administered 2017-06-09 – 2017-06-11 (×3): 40 mg via ORAL
  Filled 2017-06-09 (×3): qty 2

## 2017-06-09 MED ORDER — HEPARIN (PORCINE) IN NACL 100-0.45 UNIT/ML-% IJ SOLN
1150.0000 [IU]/h | INTRAMUSCULAR | Status: DC
  Administered 2017-06-09: 1000 [IU]/h via INTRAVENOUS
  Administered 2017-06-10: 1150 [IU]/h via INTRAVENOUS
  Filled 2017-06-09 (×2): qty 250

## 2017-06-09 MED ORDER — HEPARIN BOLUS VIA INFUSION
1050.0000 [IU] | Freq: Once | INTRAVENOUS | Status: AC
  Administered 2017-06-09: 1050 [IU] via INTRAVENOUS
  Filled 2017-06-09: qty 1050

## 2017-06-09 MED ORDER — HEPARIN (PORCINE) IN NACL 100-0.45 UNIT/ML-% IJ SOLN
12.0000 [IU]/kg/h | INTRAMUSCULAR | Status: DC
  Administered 2017-06-09: 12 [IU]/kg/h via INTRAVENOUS
  Filled 2017-06-09: qty 250

## 2017-06-09 MED ORDER — NYSTATIN 100000 UNIT/ML MT SUSP
5.0000 mL | Freq: Four times a day (QID) | OROMUCOSAL | Status: DC
  Administered 2017-06-09 – 2017-06-12 (×10): 500000 [IU] via ORAL
  Filled 2017-06-09 (×10): qty 5

## 2017-06-09 MED ORDER — CARVEDILOL 6.25 MG PO TABS
6.2500 mg | ORAL_TABLET | Freq: Two times a day (BID) | ORAL | Status: DC
  Administered 2017-06-10 – 2017-06-12 (×5): 6.25 mg via ORAL
  Filled 2017-06-09 (×5): qty 1

## 2017-06-09 MED ORDER — HEPARIN BOLUS VIA INFUSION
4000.0000 [IU] | Freq: Once | INTRAVENOUS | Status: AC
  Administered 2017-06-09: 4000 [IU] via INTRAVENOUS
  Filled 2017-06-09: qty 4000

## 2017-06-09 NOTE — Progress Notes (Signed)
ANTICOAGULATION CONSULT NOTE  Pharmacy Consult for Heparin Indication: chest pain/ACS  No Known Allergies  Patient Measurements: Height: 6\' 1"  (185.4 cm) Weight: 156 lb 3.2 oz (70.9 kg) IBW/kg (Calculated) : 79.9 Heparin Dosing Weight: 71 kg  Vital Signs: Temp: 98.5 F (36.9 C) (11/05 1941) Temp Source: Oral (11/05 1941) BP: 139/97 (11/05 1941) Pulse Rate: 103 (11/05 1941)  Labs: Recent Labs    06/08/17 1119  06/08/17 2307 06/09/17 0513 06/09/17 1222 06/09/17 1857  HGB 13.3  --   --  13.5  --   --   HCT 41.6  --   --  41.3  --   --   PLT 356  --   --  327  --   --   APTT  --   --   --   --  33  --   LABPROT  --   --   --   --  15.3*  --   INR  --   --   --   --  1.22  --   HEPARINUNFRC  --   --   --   --   --  0.22*  CREATININE 1.48*  --   --  1.45*  --   --   TROPONINI  --    < > 1.66* 1.87*  --  1.59*   < > = values in this interval not displayed.    Estimated Creatinine Clearance: 59.1 mL/min (A) (by C-G formula based on SCr of 1.45 mg/dL (H)).   Medical History: Past Medical History:  Diagnosis Date  . Dermatitis   . HIV (human immunodeficiency virus infection) (HCC)   . Hypertension     Assessment: 51 y/o M with a h/o HIV and no known h/o cardiac history admitted with SOB and elevated troponin .  Goal of Therapy:  Heparin level 0.3-0.7 units/ml Monitor platelets by anticoagulation protocol: Yes   Plan:  Give 4000 units bolus x 1 Start heparin infusion at 850 units/hr Check anti-Xa level in 6 hours and daily while on heparin Continue to monitor H&H and platelets   11/5 @ 1900 HL 0.22 subtherapeutic. Will rebolus w/ heparin 1050 units IV x 1 and increase rate to 1000 units/hr and will recheck HL @ 0330 w/ CBC check w/ am labs.  Thomasene Rippleavid Jerome Otter, PharmD, BCPS Clinical Pharmacist 06/09/2017

## 2017-06-09 NOTE — Consult Note (Signed)
[facility] Infectious Disease     Reason for Consult HIV    Referring Physician: Josepha Pigg Date of Admission:  06/08/2017   Principal Problem:   CHF (congestive heart failure) ([place]) Active Problems:   Elevated troponin   Early satiety   HIV disease ([place])   HPI: Manuel Rosales is a 51 y.o. male With a history of HIV and hypertension admitted with increasing dyspnea and shortness of breath.  On admission patient had elevated BNP and chest x-ray suggesting CHF.  He also had elevated troponins and blood pressure.  He was admitted and seen by cardiology.  For HIV he seems to have been out of care.  His last visit was in February 2015.  At that time his CD4 count was 235/21% and his viral load was 236.Did have labs done June 2018 CD4 was 114. At that time he was admitted for small bowel obstruction related to an open appendectomy with perforated appendicitis for years prior. Since admission he has had a CT scan which shows a 11 cm abscess in his Psoas and iliacus  muscles.  Chest x-ray suggests CHF Currently he is improving, no fevers, not on O2. Denies wt loss or night sweats.  Past Medical History:  Diagnosis Date  . Dermatitis   . HIV (human immunodeficiency virus infection) ([place])   . Hypertension    Past Surgical History:  Procedure Laterality Date  . APPENDECTOMY     Social History   Tobacco Use  . Smoking status: Current Every Day Smoker    Packs/day: 0.50    Types: Cigarettes  . Smokeless tobacco: Never Used  Substance Use Topics  . Alcohol use: Yes  . Drug use: No   Family History  Problem Relation Age of Onset  . Cerebral aneurysm Brother        died    Allergies: No Known Allergies  Current antibiotics: Antibiotics Given (last 72 hours)    Date/Time Action Medication Dose Rate   06/08/17 1819 New Bag/Given   piperacillin-tazobactam (ZOSYN) IVPB 3.375 g 3.375 g 100 mL/hr   06/08/17 1858 New Bag/Given   vancomycin (VANCOCIN) IVPB 1000 mg/200 mL premix  1,000 mg 200 mL/hr   06/09/17 0007 New Bag/Given   piperacillin-tazobactam (ZOSYN) IVPB 3.375 g 3.375 g 12.5 mL/hr   06/09/17 0156 New Bag/Given   vancomycin (VANCOCIN) 1,500 mg in sodium chloride 0.9 % 500 mL IVPB 1,500 mg 250 mL/hr   06/09/17 0854 New Bag/Given   piperacillin-tazobactam (ZOSYN) IVPB 3.375 g 3.375 g 12.5 mL/hr      MEDICATIONS: . aspirin EC  81 mg Oral Daily  . docusate sodium  100 mg Oral BID  . furosemide  40 mg Intravenous Q12H  . losartan  50 mg Oral Daily  . metoprolol tartrate  25 mg Oral BID  . pantoprazole (PROTONIX) IV  40 mg Intravenous Q12H  . potassium chloride  20 mEq Oral BID    Review of Systems - 11 systems reviewed and negative per HPI   OBJECTIVE: Temp:  [97.2 F (36.2 C)-98.4 F (36.9 C)] 98.2 F (36.8 C) (11/05 0847) Pulse Rate:  [88-99] 99 (11/05 0847) Resp:  [18-20] 18 (11/05 0847) BP: (131-161)/(85-124) 131/85 (11/05 0847) SpO2:  [93 %-100 %] 93 % (11/05 0847) Weight:  [70.9 kg (156 lb 3.2 oz)] 70.9 kg (156 lb 3.2 oz) (11/05 0458) Physical Exam  Constitutional: He is oriented to person, place, and time. He appears well-developed and well-nourished. No distress.  HENT:  Mouth/Throat: Oropharynx  some thrush on hard palate and tongue Cardiovascular: Normal rate, regular rhythm and normal heart sounds. Exam reveals no gallop and no friction rub.  No murmur heard.  Pulmonary/Chest:bibasilar rhonci  Abdominal: Soft. Bowel sounds are normal. He exhibits no distension. There is no tenderness.  Lymphadenopathy: He has no cervical adenopathy.  Neurological: He is alert and oriented to person, place, and time.  Skin: Skin is warm and dry. No rash noted. No erythema.  Psychiatric: He has a normal mood and affect. His behavior is normal.    LABS: Results for orders placed or performed during the hospital encounter of 06/08/17 (from the past 48 hour(s))  Lipase, blood     Status: None   Collection Time: 06/08/17 11:19 AM  Result Value Ref  Range   Lipase 20 11 - 51 U/L  Comprehensive metabolic panel     Status: Abnormal   Collection Time: 06/08/17 11:19 AM  Result Value Ref Range   Sodium 139 135 - 145 mmol/L   Potassium 4.2 3.5 - 5.1 mmol/L   Chloride 104 101 - 111 mmol/L   CO2 26 22 - 32 mmol/L   Glucose, Bld 117 (H) 65 - 99 mg/dL   BUN 26 (H) 6 - 20 mg/dL   Creatinine, Ser 1.48 (H) 0.61 - 1.24 mg/dL   Calcium 8.5 (L) 8.9 - 10.3 mg/dL   Total Protein 8.1 6.5 - 8.1 g/dL   Albumin 2.7 (L) 3.5 - 5.0 g/dL   AST 50 (H) 15 - 41 U/L   ALT 30 17 - 63 U/L   Alkaline Phosphatase 77 38 - 126 U/L   Total Bilirubin 0.9 0.3 - 1.2 mg/dL   GFR calc non Af Amer 52 (L) >60 mL/min   GFR calc Af Amer >60 >60 mL/min    Comment: (NOTE) The eGFR has been calculated using the CKD EPI equation. This calculation has not been validated in all clinical situations. eGFR's persistently <60 mL/min signify possible Chronic Kidney Disease.    Anion gap 9 5 - 15  CBC     Status: Abnormal   Collection Time: 06/08/17 11:19 AM  Result Value Ref Range   WBC 4.2 3.8 - 10.6 K/uL   RBC 4.73 4.40 - 5.90 MIL/uL   Hemoglobin 13.3 13.0 - 18.0 g/dL   HCT 41.6 40.0 - 52.0 %   MCV 88.0 80.0 - 100.0 fL   MCH 28.1 26.0 - 34.0 pg   MCHC 31.9 (L) 32.0 - 36.0 g/dL   RDW 18.0 (H) 11.5 - 14.5 %   Platelets 356 150 - 440 K/uL  Troponin I     Status: Abnormal   Collection Time: 06/08/17 11:27 AM  Result Value Ref Range   Troponin I 1.54 (HH) <0.03 ng/mL    Comment: CRITICAL RESULT CALLED TO, READ BACK BY AND VERIFIED WITH SHANNIN HATCH @ 1508 06/08/17 [place]   Brain natriuretic peptide     Status: Abnormal   Collection Time: 06/08/17 11:27 AM  Result Value Ref Range   B Natriuretic Peptide 4,154.0 (H) 0.0 - 100.0 pg/mL  Troponin I     Status: Abnormal   Collection Time: 06/08/17  5:33 PM  Result Value Ref Range   Troponin I 1.68 (HH) <0.03 ng/mL    Comment: CRITICAL VALUE NOTED. VALUE IS CONSISTENT WITH PREVIOUSLY REPORTED/CALLED VALUE. QSD  Urinalysis,  Complete w Microscopic     Status: Abnormal   Collection Time: 06/08/17  7:35 PM  Result Value Ref Range   Color, Urine  STRAW (A) YELLOW   APPearance CLEAR (A) CLEAR   Specific Gravity, Urine 1.016 1.005 - 1.030   pH 5.0 5.0 - 8.0   Glucose, UA NEGATIVE NEGATIVE mg/dL   Hgb urine dipstick NEGATIVE NEGATIVE   Bilirubin Urine NEGATIVE NEGATIVE   Ketones, ur NEGATIVE NEGATIVE mg/dL   Protein, ur NEGATIVE NEGATIVE mg/dL   Nitrite NEGATIVE NEGATIVE   Leukocytes, UA NEGATIVE NEGATIVE   RBC / HPF NONE SEEN 0 - 5 RBC/hpf   WBC, UA 0-5 0 - 5 WBC/hpf   Bacteria, UA NONE SEEN NONE SEEN   Squamous Epithelial / LPF NONE SEEN NONE SEEN   Mucus PRESENT    Hyaline Casts, UA PRESENT   Troponin I     Status: Abnormal   Collection Time: 06/08/17 11:07 PM  Result Value Ref Range   Troponin I 1.66 (HH) <0.03 ng/mL    Comment: CRITICAL VALUE NOTED. VALUE IS CONSISTENT WITH PREVIOUSLY REPORTED/CALLED VALUE.PMH  Troponin I     Status: Abnormal   Collection Time: 06/09/17  5:13 AM  Result Value Ref Range   Troponin I 1.87 (HH) <0.03 ng/mL    Comment: CRITICAL VALUE NOTED. VALUE IS CONSISTENT WITH PREVIOUSLY REPORTED/CALLED VALUE.PMH  Comprehensive metabolic panel     Status: Abnormal   Collection Time: 06/09/17  5:13 AM  Result Value Ref Range   Sodium 138 135 - 145 mmol/L   Potassium 4.2 3.5 - 5.1 mmol/L   Chloride 104 101 - 111 mmol/L   CO2 26 22 - 32 mmol/L   Glucose, Bld 116 (H) 65 - 99 mg/dL   BUN 23 (H) 6 - 20 mg/dL   Creatinine, Ser 1.45 (H) 0.61 - 1.24 mg/dL   Calcium 8.0 (L) 8.9 - 10.3 mg/dL   Total Protein 7.4 6.5 - 8.1 g/dL   Albumin 2.5 (L) 3.5 - 5.0 g/dL   AST 44 (H) 15 - 41 U/L   ALT 27 17 - 63 U/L   Alkaline Phosphatase 65 38 - 126 U/L   Total Bilirubin 1.1 0.3 - 1.2 mg/dL   GFR calc non Af Amer 54 (L) >60 mL/min   GFR calc Af Amer >60 >60 mL/min    Comment: (NOTE) The eGFR has been calculated using the CKD EPI equation. This calculation has not been validated in all clinical  situations. eGFR's persistently <60 mL/min signify possible Chronic Kidney Disease.    Anion gap 8 5 - 15  CBC     Status: Abnormal   Collection Time: 06/09/17  5:13 AM  Result Value Ref Range   WBC 4.9 3.8 - 10.6 K/uL   RBC 4.74 4.40 - 5.90 MIL/uL   Hemoglobin 13.5 13.0 - 18.0 g/dL   HCT 41.3 40.0 - 52.0 %   MCV 87.1 80.0 - 100.0 fL   MCH 28.4 26.0 - 34.0 pg   MCHC 32.6 32.0 - 36.0 g/dL   RDW 17.7 (H) 11.5 - 14.5 %   Platelets 327 150 - 440 K/uL  Glucose, capillary     Status: Abnormal   Collection Time: 06/09/17  8:07 AM  Result Value Ref Range   Glucose-Capillary 118 (H) 65 - 99 mg/dL   No components found for: ESR, C REACTIVE PROTEIN MICRO: No results found for this or any previous visit (from the past 720 hour(s)).  IMAGING: Dg Chest 2 View  Result Date: 06/08/2017 CLINICAL DATA:  Short of breath for 3 days EXAM: CHEST  2 VIEW COMPARISON:  07/19/2013 FINDINGS: The heart is moderately enlarged.  Vascular congestion. Mild interstitial edema at the lung bases. No pneumothorax or pleural effusion. IMPRESSION: Mild CHF. Electronically Signed   By: Marybelle Killings M.D.   On: 06/08/2017 12:23   Ct Abdomen Pelvis W Contrast  Result Date: 06/08/2017 CLINICAL DATA:  51 y.o. male patient with a history of HIV as well as hypertension and small bowel obstruction was presented to the emergency department today with 2-3 days of shortness of breath and weakness. The patient is also complaining of diffuse abdominal pain with nausea and inability to tolerate any large amount of food. Patient denies any radiation of his abdominal pain. Denies any chest pain. Patient says that his shortness of breath is worse when he has exerting himself or lying flat EXAM: CT ABDOMEN AND PELVIS WITH CONTRAST TECHNIQUE: Multidetector CT imaging of the abdomen and pelvis was performed using the standard protocol following bolus administration of intravenous contrast. CONTRAST:  178m ISOVUE-300 IOPAMIDOL (ISOVUE-300)  INJECTION 61% COMPARISON:  01/10/2017 FINDINGS: Lower chest: Heart is mildly enlarged. Trace right pleural effusion. Mild dependent subsegmental atelectasis in the right lung base. Fat containing right Bochdalek hernia. Hepatobiliary: No focal liver abnormality is seen. No gallstones, gallbladder wall thickening, or biliary dilatation. Pancreas: Unremarkable. No pancreatic ductal dilatation or surrounding inflammatory changes. Spleen: Normal in size without focal abnormality. Adrenals/Urinary Tract: Adrenal glands are unremarkable. Kidneys are normal, without renal calculi, focal lesion, or hydronephrosis. Bladder is unremarkable. Stomach/Bowel: Stomach, small bowel and colon are unremarkable. Appendix has been removed. Vascular/Lymphatic: Mild aortic atherosclerosis. There are prominent and mildly enlarged external iliac and inguinal lymph nodes. Largest left inguinal node is 15 mm in short axis. Largest right inguinal node is also 15 mm in short axis. Reproductive: Unremarkable. Other: There is a heterogeneous fluid collection extending from the lateral soleus muscle through the iliacus muscle into the right anterior pelvis. It measures approximately 11.2 x 5.2 x 6.8 cm. There are mild adjacent inflammatory changes in a trace amount of fluid. Musculoskeletal: No fracture or acute finding. No CT evidence of discitis or SI joint infection. IMPRESSION: 1. Heterogeneous lobulated fluid collection involving the right psoas and iliacus muscles, measuring 11.2 cm in greatest dimension, consistent with an abscess. 2. No other acute abnormalities within the abdomen or pelvis. 3. Trace right pleural effusion. 4. Mild cardiomegaly. 5. Mild aortic atherosclerosis. Electronically Signed   By: DLajean ManesM.D.   On: 06/08/2017 16:27    Assessment:   Manuel MOLLETTis a 51y.o. male with HIV AIDS - out of care since 2015, most recent CD4 114, now admitted with DOE and found to have Elevated BNP, cxr with CHF findings, EF  20-25% and CT abd with a 11 cm psoas fluid collection.  Clinically improving.  He likely has CHF related to untreated HTN and unrelated to HIV. However his psoas fluid collection will likely need further evaluation after acute issues addressed. He has thrush as well. [facility] check labs [facility]  Recommendations Check cd4, PCR, genotype, Crypto antigen Nystatin for thrush. Will need bactrim for PCP PPx but will hold for now given fluctuating renal function Thank you very much for allowing me to participate in the care of this patient. Please call with questions.   DCheral Marker FOla Spurr MD

## 2017-06-09 NOTE — Consult Note (Signed)
Manuel Bouillon, MD [address], [address], [place], [place], [address] [address], [address], [place], [place], [address] Phone: [phone]  Fax: [phone]  Consultation  Referring Provider:     Dr. Allena Katz Primary Care Physician:  Patient, No Pcp Per Primary Gastroenterologist:  Dr. Maximino Greenland         Reason for Consultation:     Early satiety and abdominal pain  Date of Admission:  06/08/2017 Date of Consultation:  06/09/2017         HPI:   Manuel Rosales is a 51 y.o. male presents with abdominal pain of 5 day duration. It is located in the midepigastric region, 4/10 in severity, dull in quality. It began 5 days ago along with early satiety and nausea but no vomiting. He did not have these symptoms prior to that. He denies any illicit drug use. He also reports Right upper leg and right flank discomfort for the same period of time. On admission had a CT scan that revealed a right psoas abscess measuring 11.2 cm and no other acute abdominal pathology was reported on the CT scan. He was also noted to have elevated Troponin and NSTEMI. He has hx of HIV and is not on meds as this time. No weight loss. No SBO on CT Scan. 5 months ago admitted for SBO attributed to previous surgery for open appendectomy due to perforated appendix 4 yrs. Was doing well since that discharge. No blood in stool or altered bowel habits. Takes Naproxen at home.   Past Medical History:  Diagnosis Date  . Dermatitis   . HIV (human immunodeficiency virus infection) ([facility])   . Hypertension     Past Surgical History:  Procedure Laterality Date  . APPENDECTOMY      Prior to Admission medications   Medication Sig Start Date End Date Taking? Authorizing Provider  atenolol (TENORMIN) 25 MG tablet Take 1 tablet (25 mg total) by mouth daily. 01/06/17   Auburn Bilberry, MD  diazepam (VALIUM) 5 MG tablet Take 1 tablet (5 mg total) by mouth every 8 (eight) hours as needed for muscle spasms. 01/10/17   Sharman Cheek, MD  hydrochlorothiazide (HYDRODIURIL) 25 MG tablet Take 1 tablet (25 mg total) by mouth daily. 01/06/17   Auburn Bilberry, MD  naproxen (NAPROSYN) 500 MG tablet Take 1 tablet (500 mg total) by mouth 2 (two) times daily with a meal. 01/10/17   Sharman Cheek, MD    Family History  Problem Relation Age of Onset  . Cerebral aneurysm Brother        died     Social History   Tobacco Use  . Smoking status: Current Every Day Smoker    Packs/day: 0.50    Types: Cigarettes  . Smokeless tobacco: Never Used  Substance Use Topics  . Alcohol use: Yes  . Drug use: No    Allergies as of 06/08/2017  . (No Known Allergies)    Review of Systems:    All systems reviewed and negative except where noted in HPI.   Physical Exam:  Vital signs in last 24 hours: Temp:  [97.2 F (36.2 C)-98.4 F (36.9 C)] 98.2 F (36.8 C) (11/05 0847) Pulse Rate:  [88-99] 99 (11/05 0847) Resp:  [18-20] 18 (11/05 0847) BP: (131-161)/(85-124) 131/85 (11/05 0847) SpO2:  [93 %-100 %] 93 % (11/05 0847) Weight:  [70.9 kg (156 lb 3.2 oz)] 70.9 kg (156 lb 3.2 oz) (11/05 0458) Last BM Date: 06/08/17 General:   Pleasant, cooperative in  NAD Head:  Normocephalic and atraumatic. Eyes:   No icterus.   Conjunctiva pink. PERRLA. Ears:  Normal auditory acuity. Neck:  Supple; no masses or thyroidomegaly Lungs: Respirations even and unlabored. Lungs clear to auscultation bilaterally.   No wheezes, crackles, or rhonchi.  Heart:  Regular rate and rhythm;  Without murmur, clicks, rubs or gallops Abdomen:  Soft, nondistended, nontender. Normal bowel sounds. No appreciable masses or hepatomegaly.  No rebound or guarding.  Rectal:  Not performed. Msk:  Symmetrical without gross deformities.  Strength 5/5 UE b/l  Extremities:  Without edema, cyanosis or clubbing. Neurologic:  Alert and oriented x3;  grossly normal neurologically. Skin:  Intact without significant lesions or rashes. Cervical Nodes:  No significant cervical  adenopathy. Psych:  Alert and cooperative. Normal affect.  LAB RESULTS: Recent Labs    06/08/17 1119 06/09/17 0513  WBC 4.2 4.9  HGB 13.3 13.5  HCT 41.6 41.3  PLT 356 327   BMET Recent Labs    06/08/17 1119 06/09/17 0513  NA 139 138  K 4.2 4.2  CL 104 104  CO2 26 26  GLUCOSE 117* 116*  BUN 26* 23*  CREATININE 1.48* 1.45*  CALCIUM 8.5* 8.0*   LFT Recent Labs    06/09/17 0513  PROT 7.4  ALBUMIN 2.5*  AST 44*  ALT 27  ALKPHOS 65  BILITOT 1.1   PT/INR Recent Labs    06/09/17 1222  LABPROT 15.3*  INR 1.22    STUDIES: Dg Chest 2 View  Result Date: 06/08/2017 CLINICAL DATA:  Short of breath for 3 days EXAM: CHEST  2 VIEW COMPARISON:  07/19/2013 FINDINGS: The heart is moderately enlarged. Vascular congestion. Mild interstitial edema at the lung bases. No pneumothorax or pleural effusion. IMPRESSION: Mild CHF. Electronically Signed   By: Jolaine ClickArthur  Hoss M.D.   On: 06/08/2017 12:23   Ct Abdomen Pelvis W Contrast  Result Date: 06/08/2017 CLINICAL DATA:  51 y.o. male patient with a history of HIV as well as hypertension and small bowel obstruction was presented to the emergency department today with 2-3 days of shortness of breath and weakness. The patient is also complaining of diffuse abdominal pain with nausea and inability to tolerate any large amount of food. Patient denies any radiation of his abdominal pain. Denies any chest pain. Patient says that his shortness of breath is worse when he has exerting himself or lying flat EXAM: CT ABDOMEN AND PELVIS WITH CONTRAST TECHNIQUE: Multidetector CT imaging of the abdomen and pelvis was performed using the standard protocol following bolus administration of intravenous contrast. CONTRAST:  100mL ISOVUE-300 IOPAMIDOL (ISOVUE-300) INJECTION 61% COMPARISON:  01/10/2017 FINDINGS: Lower chest: Heart is mildly enlarged. Trace right pleural effusion. Mild dependent subsegmental atelectasis in the right lung base. Fat containing right  Bochdalek hernia. Hepatobiliary: No focal liver abnormality is seen. No gallstones, gallbladder wall thickening, or biliary dilatation. Pancreas: Unremarkable. No pancreatic ductal dilatation or surrounding inflammatory changes. Spleen: Normal in size without focal abnormality. Adrenals/Urinary Tract: Adrenal glands are unremarkable. Kidneys are normal, without renal calculi, focal lesion, or hydronephrosis. Bladder is unremarkable. Stomach/Bowel: Stomach, small bowel and colon are unremarkable. Appendix has been removed. Vascular/Lymphatic: Mild aortic atherosclerosis. There are prominent and mildly enlarged external iliac and inguinal lymph nodes. Largest left inguinal node is 15 mm in short axis. Largest right inguinal node is also 15 mm in short axis. Reproductive: Unremarkable. Other: There is a heterogeneous fluid collection extending from the lateral soleus muscle through the iliacus muscle into the right anterior  pelvis. It measures approximately 11.2 x 5.2 x 6.8 cm. There are mild adjacent inflammatory changes in a trace amount of fluid. Musculoskeletal: No fracture or acute finding. No CT evidence of discitis or SI joint infection. IMPRESSION: 1. Heterogeneous lobulated fluid collection involving the right psoas and iliacus muscles, measuring 11.2 cm in greatest dimension, consistent with an abscess. 2. No other acute abnormalities within the abdomen or pelvis. 3. Trace right pleural effusion. 4. Mild cardiomegaly. 5. Mild aortic atherosclerosis. Electronically Signed   By: Amie Portland M.D.   On: 06/08/2017 16:27       Impression / Plan:   WILMAR PRABHAKAR is a 51 y.o. y/o male admitted with NSTEMI, acute 11 cm right psoas abscess and Nausea, early satiety and right flank pain for 5 days with GI consulted for early satiety and Nausea  Patient's midepigastric abdominal symptoms are most likely related to his NSTEMI The CT does not report any acute abdominal pathology besides the psoas abscess No  SBO reported A review of the CT images itself show there might be some gastric thickening No drop in Hgb and no altered bowel habits or active bleeding to suggest PUD Due to his Naproxen use recently would recommend continuing the PPI BID that the primary team has already started.  His symptoms have improved since admission With ongoing acute medical issues and no indications for emergent EGD would recommend treating acute medical issues including NSTEMI and psoas abscess as per Cardiology, ID, and primary team Continue PPI in the meantime  Avoid NSAIDs Continue daily daily physical abdominal exam At time of discharge please make appt for pt to follow up with me in 3-4 weeks and clinical symptoms and need for any evaluation can be discussed at that time after acute medical issues have been addressed.  Please page [place] with any questions or if symptoms change or worsen.     Thank you for involving me in the care of this patient.      LOS: 1 day   Pasty Spillers, MD  06/09/2017, 1:07 PM

## 2017-06-09 NOTE — Progress Notes (Signed)
[facility] [place] [facility]   PATIENT NAME: Manuel Rosales    MR#:  [id number]  DATE OF BIRTH:  1965/09/30  SUBJECTIVE:  Came in after having increasing shortness of breath chest pain and poor appetite for 4 days  REVIEW OF SYSTEMS:   Review of Systems  Constitutional: Negative for chills, fever and weight loss.  HENT: Negative for ear discharge, ear pain and nosebleeds.   Eyes: Negative for blurred vision, pain and discharge.  Respiratory: Positive for shortness of breath. Negative for sputum production, wheezing and stridor.   Cardiovascular: Positive for chest pain and PND. Negative for palpitations and orthopnea.  Gastrointestinal: Negative for abdominal pain, diarrhea, nausea and vomiting.  Genitourinary: Negative for frequency and urgency.  Musculoskeletal: Negative for back pain and joint pain.  Neurological: Negative for sensory change, speech change, focal weakness and weakness.  Psychiatric/Behavioral: Negative for depression and hallucinations. The patient is not nervous/anxious.    Tolerating Diet:yes Tolerating PT: not needed  DRUG ALLERGIES:  No Known Allergies  VITALS:  Blood pressure 131/85, pulse 99, temperature 98.2 F (36.8 C), temperature source Oral, resp. rate 18, height 6\' 1"  (1.854 m), weight 70.9 kg (156 lb 3.2 oz), SpO2 93 %.  PHYSICAL EXAMINATION:   Physical Exam  GENERAL:  51 y.o.-year-old patient lying in the bed with no acute distress.  EYES: Pupils equal, round, reactive to light and accommodation. No scleral icterus. Extraocular muscles intact.  HEENT: Head atraumatic, normocephalic. Oropharynx and nasopharynx clear.  NECK:  Supple, no jugular venous distention. No thyroid enlargement, no tenderness.  LUNGS: Normal breath sounds bilaterally, no wheezing, rales, rhonchi. No use of accessory muscles of respiration.  CARDIOVASCULAR: S1, S2 normal. No murmurs, rubs, or gallops.  ABDOMEN: Soft, nontender,  nondistended. Bowel sounds present. No organomegaly or mass.  EXTREMITIES: No cyanosis, clubbing or edema b/l.    NEUROLOGIC: Cranial nerves II through XII are intact. No focal Motor or sensory deficits b/l.   PSYCHIATRIC:  patient is alert and oriented x 3.  SKIN: No obvious rash, lesion, or ulcer.   LABORATORY PANEL:  CBC Recent Labs  Lab 06/09/17 0513  WBC 4.9  HGB 13.5  HCT 41.3  PLT 327    Chemistries  Recent Labs  Lab 06/09/17 0513  NA 138  K 4.2  CL 104  CO2 26  GLUCOSE 116*  BUN 23*  CREATININE 1.45*  CALCIUM 8.0*  AST 44*  ALT 27  ALKPHOS 65  BILITOT 1.1   Cardiac Enzymes Recent Labs  Lab 06/09/17 0513  TROPONINI 1.87*   RADIOLOGY:  Dg Chest 2 View  Result Date: 06/08/2017 CLINICAL DATA:  Short of breath for 3 days EXAM: CHEST  2 VIEW COMPARISON:  07/19/2013 FINDINGS: The heart is moderately enlarged. Vascular congestion. Mild interstitial edema at the lung bases. No pneumothorax or pleural effusion. IMPRESSION: Mild CHF. Electronically Signed   By: Jolaine Click M.D.   On: 06/08/2017 12:23   Ct Abdomen Pelvis W Contrast  Result Date: 06/08/2017 CLINICAL DATA:  51 y.o. male patient with a history of HIV as well as hypertension and small bowel obstruction was presented to the emergency department today with 2-3 days of shortness of breath and weakness. The patient is also complaining of diffuse abdominal pain with nausea and inability to tolerate any large amount of food. Patient denies any radiation of his abdominal pain. Denies any chest pain. Patient says that his shortness of breath is worse when he has exerting himself or  lying flat EXAM: CT ABDOMEN AND PELVIS WITH CONTRAST TECHNIQUE: Multidetector CT imaging of the abdomen and pelvis was performed using the standard protocol following bolus administration of intravenous contrast. CONTRAST:  ISOVUE-300 IOPAMIDOL (ISOVUE-300) INJECTION 61% COMPARISON:  01/10/2017 FINDINGS: Lower chest: Heart is mildly  enlarged. Trace right pleural effusion. Mild dependent subsegmental atelectasis in the right lung base. Fat containing right Bochdalek hernia. Hepatobiliary: No focal liver abnormality is seen. No gallstones, gallbladder wall thickening, or biliary dilatation. Pancreas: Unremarkable. No pancreatic ductal dilatation or surrounding inflammatory changes. Spleen: Normal in size without focal abnormality. Adrenals/Urinary Tract: Adrenal glands are unremarkable. Kidneys are normal, without renal calculi, focal lesion, or hydronephrosis. Bladder is unremarkable. Stomach/Bowel: Stomach, small bowel and colon are unremarkable. Appendix has been removed. Vascular/Lymphatic: Mild aortic atherosclerosis. There are prominent and mildly enlarged external iliac and inguinal lymph nodes. Largest left inguinal node is 15 mm in short axis. Largest right inguinal node is also 15 mm in short axis. Reproductive: Unremarkable. Other: There is a heterogeneous fluid collection extending from the lateral soleus muscle through the iliacus muscle into the right anterior pelvis. It measures approximately 11.2 x 5.2 x 6.8 cm. There are mild adjacent inflammatory changes in a trace amount of fluid. Musculoskeletal: No fracture or acute finding. No CT evidence of discitis or SI joint infection. IMPRESSION: 1. Heterogeneous lobulated fluid collection involving the right psoas and iliacus muscles, measuring 11.2 cm in greatest dimension, consistent with an abscess. 2. No other acute abnormalities within the abdomen or pelvis. 3. Trace right pleural effusion. 4. Mild cardiomegaly. 5. Mild aortic atherosclerosis. Electronically Signed   By: Amie Portland M.D.   On: 06/08/2017 16:27   ASSESSMENT AND PLAN:  Manuel Rosales is a 51 y.o. male has a past medical history significant for HIV and HTN off meds now with 1 week hx of progressive SOB and DOE. Sleeping in a chair. Not eating well as he gets "full" after a few bites. In ER, CXR and BNP suggest  CHF. BP and troponin elevated  1. NSTEMI: -Currently chest pain free.  Patient seen by cardiology recommends IV heparin drip.  Spoke with surgery Dr. Excell Seltzer who is okay with starting heparin drip. -Patient currently is not septic.  Right psoas fluid collection could be an abscess however patient is not septic white count is normal and no fever. -Continue to cycle troponin until peak -Start heparin gtt per pharmacy -EKG with changes consistent with ischemia  -Await echo -ASA -Check lipid panel and A1c for further risk stratification given elevated troponin and hyperglycemia -Patient refuses nitro patch, could use Imdur if needed  -Add Lipitor 40 mg daily  2. Acute CHF: -Type unknown, presumed ICM given troponin elevation -Renal function stable with diuresis, continue IV Lasix with KCl repletion  -Echo pending -Cannot rule out some degree of 3rd spacing given his hypoalbuminemia   3. R right Psoas muscle fluid collection versus abscess: -Seen by Dr. Sampson Goon and surgery Dr. Excell Seltzer await the recommendation -Patient on empiric antibiotics  4. HIV: -Per ID  5. AKI: -Monitor renal function with diuresis   6. HTN: -Improved -Continue current medications     Case discussed with Care Management/Social Worker. Management plans discussed with the patient, family and they are in agreement.  CODE STATUS: Full  DVT Prophylaxis: Heparin  TOTAL TIME TAKING CARE OF THIS PATIENT: 30 minutes.  >50% time spent on counselling and coordination of care  POSSIBLE D/C IN to 3 DAYS, DEPENDING ON CLINICAL CONDITION.  Note:  This dictation was prepared with Dragon dictation along with smaller phrase technology. Any transcriptional errors that result from this process are unintentional.  Kieran Nachtigal M.D on 06/09/2017 at 2:56 PM  Between 7am to 6pm - Pager - [phone]  After 6pm go to [email or web address] - password [email or web address]  [facility]  Office   [phone]  CC: Primary care physician; Patient, No Pcp Per

## 2017-06-09 NOTE — Consult Note (Signed)
Cardiology Consultation:   Patient ID: Manuel Rosales; [id number]; [birth year]   Admit date: 06/08/2017 Date of Consult: 06/09/2017  Primary Care Provider: Patient, No Pcp Per Primary Cardiologist: New to [facility] - consult by [place]   Patient Profile:   Manuel Rosales is a 51 y.o. male with a hx of HIV, SBO, and HTN who is being seen today for the evaluation of elevated troponin at the request of Dr. Judithann Sheen.  History of Present Illness:   Mr. Manuel Rosales has no previously known cardiac history. He has apparently been out of his medications for at least one week. Over the past several days he has noted increased SOB with orthopnea, abdominal distension, and early satiety. No chest pain, palpitations, nausea, vomiting, dizziness, presyncope, or syncope. Because of his SOB he presented to [facility].   Upon the patient's arrival to [facility] they were found to have BP 156/124, HR 96 bpm, temp 97.9, oxygen saturation 100% on room air, weight 156 pounds. EKG as below, CXR showed vascular congestion. CT abdomen/pelvis showed a heterogeneous lobulated fluid collection involving the right psoas and iliacus muscles, measuring 11.2 cm consistent with abscess. Labs showed troponin 1.54-->1.68-->1.66-->1.87. He was not started on heparin or full dose Lovenox upon admission. WBC 4.2, HGB 13.3, PLT 356. CD4 count from 01/2017 of 114. No recent viral load. BNP 4,154. SCr 1.48 with a baseline of 1.0-1.1, K+ 4.2, albumin 2.7. Echo is pending. He was started on IV Lasix 40 mg bid with documented UOP of 1.9 L for the admission. He was also started on vancomycin and Zosyn. Currently, no chest pain. Mild SOB when supine.   Past Medical History:  Diagnosis Date  . Dermatitis   . HIV (human immunodeficiency virus infection) (HCC)   . Hypertension     Past Surgical History:  Procedure Laterality Date  . APPENDECTOMY       Home Meds: Prior to Admission medications   Medication Sig Start Date End Date Taking? Authorizing  Provider  atenolol (TENORMIN) 25 MG tablet Take 1 tablet (25 mg total) by mouth daily. 01/06/17   Auburn Bilberry, MD  diazepam (VALIUM) 5 MG tablet Take 1 tablet (5 mg total) by mouth every 8 (eight) hours as needed for muscle spasms. 01/10/17   Sharman Cheek, MD  hydrochlorothiazide (HYDRODIURIL) 25 MG tablet Take 1 tablet (25 mg total) by mouth daily. 01/06/17   Auburn Bilberry, MD  naproxen (NAPROSYN) 500 MG tablet Take 1 tablet (500 mg total) by mouth 2 (two) times daily with a meal. 01/10/17   Sharman Cheek, MD    Inpatient Medications: Scheduled Meds: . aspirin EC  81 mg Oral Daily  . docusate sodium  100 mg Oral BID  . furosemide  40 mg Intravenous Q12H  . losartan  50 mg Oral Daily  . metoprolol tartrate  25 mg Oral BID  . nitroGLYCERIN  1 inch Topical Q6H  . pantoprazole (PROTONIX) IV  40 mg Intravenous Q12H  . potassium chloride  20 mEq Oral BID   Continuous Infusions: . piperacillin-tazobactam (ZOSYN)  IV 3.375 g (06/09/17 0854)  . vancomycin Stopped (06/09/17 0426)   PRN Meds: acetaminophen **OR** acetaminophen, bisacodyl, diazepam, hydrALAZINE, ipratropium-albuterol, morphine injection, nitroGLYCERIN, ondansetron **OR** ondansetron (ZOFRAN) IV  Allergies:  No Known Allergies  Social History:   Social History   Socioeconomic History  . Marital status: Single    Spouse name: Not on file  . Number of children: Not on file  . Years of education: Not on  file  . Highest education level: Not on file  Social Needs  . Financial resource strain: Not on file  . Food insecurity - worry: Not on file  . Food insecurity - inability: Not on file  . Transportation needs - medical: Not on file  . Transportation needs - non-medical: Not on file  Occupational History  . Not on file  Tobacco Use  . Smoking status: Current Every Day Smoker    Packs/day: 0.50    Types: Cigarettes  . Smokeless tobacco: Never Used  Substance and Sexual Activity  . Alcohol use: Yes  . Drug use:  No  . Sexual activity: Not on file  Other Topics Concern  . Not on file  Social History Narrative  . Not on file     Family History:  Family History  Problem Relation Age of Onset  . Cerebral aneurysm Brother        died    ROS:  Review of Systems  Constitutional: Positive for malaise/fatigue. Negative for chills, diaphoresis, fever and weight loss.  HENT: Negative for congestion.   Eyes: Negative for discharge and redness.  Respiratory: Positive for shortness of breath. Negative for cough, hemoptysis, sputum production and wheezing.   Cardiovascular: Negative for chest pain, palpitations, orthopnea, claudication, leg swelling and PND.  Gastrointestinal: Positive for nausea. Negative for abdominal pain, blood in stool, heartburn, melena and vomiting.       Early satiety   Genitourinary: Negative for hematuria.  Musculoskeletal: Negative for falls and myalgias.  Skin: Negative for rash.  Neurological: Positive for weakness. Negative for dizziness, tingling, tremors, sensory change, speech change, focal weakness and loss of consciousness.  Endo/Heme/Allergies: Does not bruise/bleed easily.  Psychiatric/Behavioral: Negative for substance abuse. The patient is not nervous/anxious.       Physical Exam/Data:   Vitals:   06/08/17 2019 06/09/17 0458 06/09/17 0714 06/09/17 0847  BP: (!) 142/99 (!) 140/96  131/85  Pulse: 95 93  99  Resp:    18  Temp: (!) 97.2 F (36.2 C) 98.4 F (36.9 C)  98.2 F (36.8 C)  TempSrc: Oral Oral  Oral  SpO2: 98% 97% 97% 93%  Weight:  156 lb 3.2 oz (70.9 kg)    Height:        Intake/Output Summary (Last 24 hours) at 06/09/2017 1039 Last data filed at 06/09/2017 1021 Gross per 24 hour  Intake 1200 ml  Output 2450 ml  Net -1250 ml   Filed Weights   06/08/17 1118 06/09/17 0458  Weight: 170 lb (77.1 kg) 156 lb 3.2 oz (70.9 kg)   Body mass index is 20.61 kg/m.   Physical Exam: General: Well developed, well nourished, in no acute  distress. Head: Normocephalic, atraumatic, sclera non-icteric, no xanthomas, nares without discharge. Neck: Negative for carotid bruits. JVD not elevated. Lungs: Clear bilaterally to auscultation without wheezes, rales, or rhonchi. Breathing is unlabored. Heart: RRR with S1 S2. No murmurs, rubs, or gallops appreciated. Abdomen: Soft, non-tender, non-distended with normoactive bowel sounds. No hepatomegaly. No rebound/guarding. No obvious abdominal masses. Msk:  Strength and tone appear normal for age. Extremities: No clubbing or cyanosis. No edema. Distal pedal pulses are 2+ and equal bilaterally. Neuro: Alert and oriented X 3. No facial asymmetry. No focal deficit. Moves all extremities spontaneously. Psych:  Responds to questions appropriately with a normal affect.   EKG:  The EKG was personally reviewed and demonstrates: sinus tachycardia, 101 bpm, anterior and inferolateral TWI Telemetry:  Telemetry was personally reviewed and demonstrates:  NSR  Weights: Filed Weights   06/08/17 1118 06/09/17 0458  Weight: 170 lb (77.1 kg) 156 lb 3.2 oz (70.9 kg)    Relevant CV Studies: TTE pending.   Laboratory Data:  Chemistry Recent Labs  Lab 06/08/17 1119 06/09/17 0513  NA 139 138  K 4.2 4.2  CL 104 104  CO2 26 26  GLUCOSE 117* 116*  BUN 26* 23*  CREATININE 1.48* 1.45*  CALCIUM 8.5* 8.0*  GFRNONAA 52* 54*  GFRAA >60 >60  ANIONGAP 9 8    Recent Labs  Lab 06/08/17 1119 06/09/17 0513  PROT 8.1 7.4  ALBUMIN 2.7* 2.5*  AST 50* 44*  ALT 30 27  ALKPHOS 77 65  BILITOT 0.9 1.1   Hematology Recent Labs  Lab 06/08/17 1119 06/09/17 0513  WBC 4.2 4.9  RBC 4.73 4.74  HGB 13.3 13.5  HCT 41.6 41.3  MCV 88.0 87.1  MCH 28.1 28.4  MCHC 31.9* 32.6  RDW 18.0* 17.7*  PLT 356 327   Cardiac Enzymes Recent Labs  Lab 06/08/17 1127 06/08/17 1733 06/08/17 2307 06/09/17 0513  TROPONINI 1.54* 1.68* 1.66* 1.87*   No results for input(s): TROPIPOC in the last 168 hours.   BNP Recent Labs  Lab 06/08/17 1127  BNP 4,154.0*    DDimer No results for input(s): DDIMER in the last 168 hours.  Radiology/Studies:  Dg Chest 2 View  Result Date: 06/08/2017 IMPRESSION: Mild CHF. Electronically Signed   By: Jolaine ClickArthur  Hoss M.D.   On: 06/08/2017 12:23   Ct Abdomen Pelvis W Contrast  Result Date: 06/08/2017 IMPRESSION: 1. Heterogeneous lobulated fluid collection involving the right psoas and iliacus muscles, measuring 11.2 cm in greatest dimension, consistent with an abscess. 2. No other acute abnormalities within the abdomen or pelvis. 3. Trace right pleural effusion. 4. Mild cardiomegaly. 5. Mild aortic atherosclerosis. Electronically Signed   By: Amie Portlandavid  Ormond M.D.   On: 06/08/2017 16:27    Assessment and Plan:   1. NSTEMI: -Currently chest pain free -Continue to cycle troponin until peak -Start heparin gtt per pharmacy -EKG with changes consistent with ischemia  -Await echo -Ideally would need to perform LHC, though his RLE abscess and SKI make this difficult at this time -Will need input from general surgery regarding possible I&D of abscess prior to undergoing LHC with possible PCI -ASA -Check lipid panel and A1c for further risk stratification given elevated troponin and hyperglycemia -Patient refuses nitro patch, could use Imdur if needed  -Add Lipitor 40 mg daily  2. Acute CHF: -Type unknown, presumed ICM given troponin elevation -Renal function stable with diuresis, continue IV Lasix with KCl repletion  -Echo pending -Cannot rule out some degree of 3rd spacing given his hypoalbuminemia   3. RLE abscess: -Per IM/surgery  4. HIV: -Per ID  5. AKI: -Monitor renal function with diuresis   6. HTN: -Improved -Continue current medications    For questions or updates, please contact [facility] Please consult [email or web address] for contact info under Cardiology/STEMI.   Signed, Eula Listenyan Rufina Kimery, PA-C [facility] Pager: [phone] 06/09/2017,  10:39 AM

## 2017-06-09 NOTE — Plan of Care (Signed)
No changes noted , started on heparin drip, due the high trop. Level,will be monitor for bleeding , none noted at this time ,

## 2017-06-09 NOTE — Progress Notes (Signed)
ANTICOAGULATION CONSULT NOTE  Pharmacy Consult for Heparin Indication: chest pain/ACS  No Known Allergies  Patient Measurements: Height: 6\' 1"  (185.4 cm) Weight: 156 lb 3.2 oz (70.9 kg) IBW/kg (Calculated) : 79.9 Heparin Dosing Weight: 71 kg  Vital Signs: Temp: 98.2 F (36.8 C) (11/05 0847) Temp Source: Oral (11/05 0847) BP: 131/85 (11/05 0847) Pulse Rate: 99 (11/05 0847)  Labs: Recent Labs    06/08/17 1119  06/08/17 1733 06/08/17 2307 06/09/17 0513  HGB 13.3  --   --   --  13.5  HCT 41.6  --   --   --  41.3  PLT 356  --   --   --  327  CREATININE 1.48*  --   --   --  1.45*  TROPONINI  --    < > 1.68* 1.66* 1.87*   < > = values in this interval not displayed.    Estimated Creatinine Clearance: 59.1 mL/min (A) (by C-G formula based on SCr of 1.45 mg/dL (H)).   Medical History: Past Medical History:  Diagnosis Date  . Dermatitis   . HIV (human immunodeficiency virus infection) (HCC)   . Hypertension     Assessment: 51 y/o M with a h/o HIV and no known h/o cardiac history admitted with SOB and elevated troponin .  Goal of Therapy:  Heparin level 0.3-0.7 units/ml Monitor platelets by anticoagulation protocol: Yes   Plan:  Give 4000 units bolus x 1 Start heparin infusion at 850 units/hr Check anti-Xa level in 6 hours and daily while on heparin Continue to monitor H&H and platelets  Luisa Harthristy, Luree Palla D 06/09/2017,11:59 AM

## 2017-06-09 NOTE — Progress Notes (Signed)
*  PRELIMINARY RESULTS* Echocardiogram 2D Echocardiogram has been performed.  Cristela BlueHege, Delecia Vastine 06/09/2017, 2:12 PM

## 2017-06-09 NOTE — Consult Note (Signed)
Surgical Consultation  06/09/2017  Manuel Rosales is an 51 y.o. male.   Referring Physician:Sona Posey Pronto  CC: Psoasabscess  HPI: this a patient with a psoas abscess. He presented with no abdominal pain. But he was experiencing early satiety there were signs of CHF and admission. A CT scan was obtained showing a spsoas fluid collection which was multiloculated. He has no abdominal pain at this time. He is seen while a cardiac echocardiogram is being performed. Past Medical History:  Diagnosis Date  . Dermatitis   . HIV (human immunodeficiency virus infection) ([place])   . Hypertension     Past Surgical History:  Procedure Laterality Date  . APPENDECTOMY      Family History  Problem Relation Age of Onset  . Cerebral aneurysm Brother        died    Social History:  reports that he has been smoking cigarettes.  He has been smoking about 0.50 packs per day. he has never used smokeless tobacco. He reports that he drinks alcohol. He reports that he does not use drugs.  Allergies: No Known Allergies  Medications reviewed.   Review of Systems:   Review of Systems  Constitutional: Negative.   HENT: Negative.   Eyes: Negative.   Respiratory: Negative.   Cardiovascular: Negative.   Gastrointestinal: Negative.   Genitourinary: Negative.   Musculoskeletal: Negative.   Skin: Negative.   Neurological: Negative.   Endo/Heme/Allergies: Negative.   Psychiatric/Behavioral: Negative.      Physical Exam:  BP 131/85 (BP Location: Right Arm)   Pulse 99   Temp 98.2 F (36.8 C) (Oral)   Resp 18   Ht _0  (1.854 m)   Wt 156 lb 3.2 oz (70.9 kg)   SpO2 93%   BMI 20.61 kg/m   Physical Exam  Constitutional: He is oriented to person, place, and time and well-developed, well-nourished, and in no distress. No distress.  HENT:  Head: Normocephalic and atraumatic.  Eyes: Pupils are equal, round, and reactive to light. Right eye exhibits no discharge. Left eye exhibits no discharge. No  scleral icterus.  Neck: Normal range of motion.  Cardiovascular: Normal rate, regular rhythm and normal heart sounds.  Pulmonary/Chest: Effort normal. No respiratory distress. He has no wheezes. He has no rales.  Abdominal: Soft. He exhibits no distension. There is no tenderness. There is no rebound.  Musculoskeletal: Normal range of motion. He exhibits edema.  Lymphadenopathy:    He has no cervical adenopathy.  Neurological: He is alert and oriented to person, place, and time.  Skin: Skin is warm and dry. No rash noted. He is not diaphoretic. No erythema.  Psychiatric: Mood and affect normal.  Vitals reviewed.     Results for orders placed or performed during the hospital encounter of 06/08/17 (from the past 48 hour(s))  Lipase, blood     Status: None   Collection Time: 06/08/17 11:19 AM  Result Value Ref Range   Lipase 20 11 - 51 U/L  Comprehensive metabolic panel     Status: Abnormal   Collection Time: 06/08/17 11:19 AM  Result Value Ref Range   Sodium 139 135 - 145 mmol/L   Potassium 4.2 3.5 - 5.1 mmol/L   Chloride 104 101 - 111 mmol/L   CO2 26 22 - 32 mmol/L   Glucose, Bld 117 (H) 65 - 99 mg/dL   BUN 26 (H) 6 - 20 mg/dL   Creatinine, Ser 1.48 (H) 0.61 - 1.24 mg/dL   Calcium 8.5 (L) 8.9 -  10.3 mg/dL   Total Protein 8.1 6.5 - 8.1 g/dL   Albumin 2.7 (L) 3.5 - 5.0 g/dL   AST 50 (H) 15 - 41 U/L   ALT 30 17 - 63 U/L   Alkaline Phosphatase 77 38 - 126 U/L   Total Bilirubin 0.9 0.3 - 1.2 mg/dL   GFR calc non Af Amer 52 (L) >60 mL/min   GFR calc Af Amer >60 >60 mL/min    Comment: (NOTE) The eGFR has been calculated using the CKD EPI equation. This calculation has not been validated in all clinical situations. eGFR's persistently <60 mL/min signify possible Chronic Kidney Disease.    Anion gap 9 5 - 15  CBC     Status: Abnormal   Collection Time: 06/08/17 11:19 AM  Result Value Ref Range   WBC 4.2 3.8 - 10.6 K/uL   RBC 4.73 4.40 - 5.90 MIL/uL   Hemoglobin 13.3 13.0 -  18.0 g/dL   HCT 41.6 40.0 - 52.0 %   MCV 88.0 80.0 - 100.0 fL   MCH 28.1 26.0 - 34.0 pg   MCHC 31.9 (L) 32.0 - 36.0 g/dL   RDW 18.0 (H) 11.5 - 14.5 %   Platelets 356 150 - 440 K/uL  Troponin I     Status: Abnormal   Collection Time: 06/08/17 11:27 AM  Result Value Ref Range   Troponin I 1.54 (HH) <0.03 ng/mL    Comment: CRITICAL RESULT CALLED TO, READ BACK BY AND VERIFIED WITH SHANNIN HATCH @ 1508 06/08/17 [place]   Brain natriuretic peptide     Status: Abnormal   Collection Time: 06/08/17 11:27 AM  Result Value Ref Range   B Natriuretic Peptide 4,154.0 (H) 0.0 - 100.0 pg/mL  Troponin I     Status: Abnormal   Collection Time: 06/08/17  5:33 PM  Result Value Ref Range   Troponin I 1.68 (HH) <0.03 ng/mL    Comment: CRITICAL VALUE NOTED. VALUE IS CONSISTENT WITH PREVIOUSLY REPORTED/CALLED VALUE. QSD  Urinalysis, Complete w Microscopic     Status: Abnormal   Collection Time: 06/08/17  7:35 PM  Result Value Ref Range   Color, Urine STRAW (A) YELLOW   APPearance CLEAR (A) CLEAR   Specific Gravity, Urine 1.016 1.005 - 1.030   pH 5.0 5.0 - 8.0   Glucose, UA NEGATIVE NEGATIVE mg/dL   Hgb urine dipstick NEGATIVE NEGATIVE   Bilirubin Urine NEGATIVE NEGATIVE   Ketones, ur NEGATIVE NEGATIVE mg/dL   Protein, ur NEGATIVE NEGATIVE mg/dL   Nitrite NEGATIVE NEGATIVE   Leukocytes, UA NEGATIVE NEGATIVE   RBC / HPF NONE SEEN 0 - 5 RBC/hpf   WBC, UA 0-5 0 - 5 WBC/hpf   Bacteria, UA NONE SEEN NONE SEEN   Squamous Epithelial / LPF NONE SEEN NONE SEEN   Mucus PRESENT    Hyaline Casts, UA PRESENT   Troponin I     Status: Abnormal   Collection Time: 06/08/17 11:07 PM  Result Value Ref Range   Troponin I 1.66 (HH) <0.03 ng/mL    Comment: CRITICAL VALUE NOTED. VALUE IS CONSISTENT WITH PREVIOUSLY REPORTED/CALLED VALUE.PMH  Troponin I     Status: Abnormal   Collection Time: 06/09/17  5:13 AM  Result Value Ref Range   Troponin I 1.87 (HH) <0.03 ng/mL    Comment: CRITICAL VALUE NOTED. VALUE IS  CONSISTENT WITH PREVIOUSLY REPORTED/CALLED VALUE.PMH  Comprehensive metabolic panel     Status: Abnormal   Collection Time: 06/09/17  5:13 AM  Result Value Ref Range  Sodium 138 135 - 145 mmol/L   Potassium 4.2 3.5 - 5.1 mmol/L   Chloride 104 101 - 111 mmol/L   CO2 26 22 - 32 mmol/L   Glucose, Bld 116 (H) 65 - 99 mg/dL   BUN 23 (H) 6 - 20 mg/dL   Creatinine, Ser 1.45 (H) 0.61 - 1.24 mg/dL   Calcium 8.0 (L) 8.9 - 10.3 mg/dL   Total Protein 7.4 6.5 - 8.1 g/dL   Albumin 2.5 (L) 3.5 - 5.0 g/dL   AST 44 (H) 15 - 41 U/L   ALT 27 17 - 63 U/L   Alkaline Phosphatase 65 38 - 126 U/L   Total Bilirubin 1.1 0.3 - 1.2 mg/dL   GFR calc non Af Amer 54 (L) >60 mL/min   GFR calc Af Amer >60 >60 mL/min    Comment: (NOTE) The eGFR has been calculated using the CKD EPI equation. This calculation has not been validated in all clinical situations. eGFR's persistently <60 mL/min signify possible Chronic Kidney Disease.    Anion gap 8 5 - 15  CBC     Status: Abnormal   Collection Time: 06/09/17  5:13 AM  Result Value Ref Range   WBC 4.9 3.8 - 10.6 K/uL   RBC 4.74 4.40 - 5.90 MIL/uL   Hemoglobin 13.5 13.0 - 18.0 g/dL   HCT 41.3 40.0 - 52.0 %   MCV 87.1 80.0 - 100.0 fL   MCH 28.4 26.0 - 34.0 pg   MCHC 32.6 32.0 - 36.0 g/dL   RDW 17.7 (H) 11.5 - 14.5 %   Platelets 327 150 - 440 K/uL  Glucose, capillary     Status: Abnormal   Collection Time: 06/09/17  8:07 AM  Result Value Ref Range   Glucose-Capillary 118 (H) 65 - 99 mg/dL  Lipid panel     Status: Abnormal   Collection Time: 06/09/17 12:22 PM  Result Value Ref Range   Cholesterol 123 0 - 200 mg/dL   Triglycerides 94 <150 mg/dL   HDL 21 (L) >40 mg/dL   Total CHOL/HDL Ratio 5.9 RATIO   VLDL 19 0 - 40 mg/dL   LDL Cholesterol 83 0 - 99 mg/dL    Comment:        Total Cholesterol/HDL:CHD Risk Coronary Heart Disease Risk Table                     Men   Women  1/2 Average Risk   3.4   3.3  Average Risk       5.0   4.4  2 X Average Risk   9.6    7.1  3 X Average Risk  23.4   11.0        Use the calculated Patient Ratio above and the CHD Risk Table to determine the patient's CHD Risk.        ATP III CLASSIFICATION (LDL):  <100     mg/dL   Optimal  100-129  mg/dL   Near or Above                    Optimal  130-159  mg/dL   Borderline  160-189  mg/dL   High  >190     mg/dL   Very High   APTT     Status: None   Collection Time: 06/09/17 12:22 PM  Result Value Ref Range   aPTT 33 24 - 36 seconds  Protime-INR     Status: Abnormal  Collection Time: 06/09/17 12:22 PM  Result Value Ref Range   Prothrombin Time 15.3 (H) 11.4 - 15.2 seconds   INR 1.22    Dg Chest 2 View  Result Date: 06/08/2017 CLINICAL DATA:  Short of breath for 3 days EXAM: CHEST  2 VIEW COMPARISON:  07/19/2013 FINDINGS: The heart is moderately enlarged. Vascular congestion. Mild interstitial edema at the lung bases. No pneumothorax or pleural effusion. IMPRESSION: Mild CHF. Electronically Signed   By: Marybelle Killings M.D.   On: 06/08/2017 12:23   Ct Abdomen Pelvis W Contrast  Result Date: 06/08/2017 CLINICAL DATA:  51 y.o. male patient with a history of HIV as well as hypertension and small bowel obstruction was presented to the emergency department today with 2-3 days of shortness of breath and weakness. The patient is also complaining of diffuse abdominal pain with nausea and inability to tolerate any large amount of food. Patient denies any radiation of his abdominal pain. Denies any chest pain. Patient says that his shortness of breath is worse when he has exerting himself or lying flat EXAM: CT ABDOMEN AND PELVIS WITH CONTRAST TECHNIQUE: Multidetector CT imaging of the abdomen and pelvis was performed using the standard protocol following bolus administration of intravenous contrast. CONTRAST:  166m ISOVUE-300 IOPAMIDOL (ISOVUE-300) INJECTION 61% COMPARISON:  01/10/2017 FINDINGS: Lower chest: Heart is mildly enlarged. Trace right pleural effusion. Mild dependent  subsegmental atelectasis in the right lung base. Fat containing right Bochdalek hernia. Hepatobiliary: No focal liver abnormality is seen. No gallstones, gallbladder wall thickening, or biliary dilatation. Pancreas: Unremarkable. No pancreatic ductal dilatation or surrounding inflammatory changes. Spleen: Normal in size without focal abnormality. Adrenals/Urinary Tract: Adrenal glands are unremarkable. Kidneys are normal, without renal calculi, focal lesion, or hydronephrosis. Bladder is unremarkable. Stomach/Bowel: Stomach, small bowel and colon are unremarkable. Appendix has been removed. Vascular/Lymphatic: Mild aortic atherosclerosis. There are prominent and mildly enlarged external iliac and inguinal lymph nodes. Largest left inguinal node is 15 mm in short axis. Largest right inguinal node is also 15 mm in short axis. Reproductive: Unremarkable. Other: There is a heterogeneous fluid collection extending from the lateral soleus muscle through the iliacus muscle into the right anterior pelvis. It measures approximately 11.2 x 5.2 x 6.8 cm. There are mild adjacent inflammatory changes in a trace amount of fluid. Musculoskeletal: No fracture or acute finding. No CT evidence of discitis or SI joint infection. IMPRESSION: 1. Heterogeneous lobulated fluid collection involving the right psoas and iliacus muscles, measuring 11.2 cm in greatest dimension, consistent with an abscess. 2. No other acute abnormalities within the abdomen or pelvis. 3. Trace right pleural effusion. 4. Mild cardiomegaly. 5. Mild aortic atherosclerosis. Electronically Signed   By: DLajean ManesM.D.   On: 06/08/2017 16:27    Assessment/Plan: labs and CT scan are personally reviewed. His last abscess appears to be the likely diagnosis for this fluid collection within his psoas and iliac is muscle. Sometimes these can be a septic. The patient is showing no signs of sepsis at this time. His white blood cell count is normal. He is HIV positive.  He has no signs of an acute process requiring surgical drainage. I spoke with Dr. PPosey Prontoconcerning the need for anticoagulation due to his elevated troponins. I I would not recommend standing in the way of appropriate cardiac care in a patient who is not septic and is essentially asymptomatic from a last fluid collection which may or may not be a abscess. We will continue to follow the patient while  in the hospital but no surgical needs at this time.  Florene Glen, MD, FACS

## 2017-06-09 NOTE — Care Management (Signed)
CM consult for patient's inability to obtain medications because does not have a family doctor.  [facility] and this is documented on his medicaid card that he presented yesterday.  Have asked unit secretary to make hospital follow up appointment. has ruled in for nstemi and if medically cleared regarding an abscess, may have cardiac cath

## 2017-06-09 NOTE — Progress Notes (Signed)
Echocardiogram reviewed Demonstrating severely depressed LV function, global hypokinesis Etiology unclear at this time, unable to exclude ischemic cardiomyopathy There is mild common iliac disease, minimal descending aorta atherosclerosis  Metoprolol tartrate changed to carvedilol He is on losartan Would continue Lasix He would benefit from ischemic workup prior to discharge  Signed, Dossie Arbourim Thedora Rings, MD, Ph.D [facility]

## 2017-06-09 NOTE — Plan of Care (Signed)
Patient has been pain free the duration of this shift

## 2017-06-09 NOTE — Discharge Instructions (Signed)
Heart Failure Clinic appointment on June 18 2017 at 10:20am with Manuel Kindredina Donasia Wimes, FNP. Please call [phone] to reschedule.

## 2017-06-09 NOTE — Progress Notes (Signed)
Troponin 1.87. No complaints of chest pain, pt resting comfortably in bed. Hospitialist made aware. No orders at this time. Will continue to monitor.   Mayra Manuel Rosales, Aubra Pappalardo M

## 2017-06-09 NOTE — Progress Notes (Signed)
Initial Nutrition Assessment  DOCUMENTATION CODES:   Not applicable  INTERVENTION:  1. Provide with ONS with diet advancement, monitor for needs  NUTRITION DIAGNOSIS:   Inadequate oral intake related to poor appetite as evidenced by per patient/family report.  GOAL:   Patient will meet greater than or equal to 90% of their needs  MONITOR:   PO intake, Supplement acceptance, I & O's, Labs, Weight trends  REASON FOR ASSESSMENT:   Consult Diet education  ASSESSMENT:   Manuel Rosales is a 51 y.o. male has a past medical history significant for HIV and HTN off meds now with 1 week hx of progressive SOB and DOE. Sleeping in a chair. Not eating well as he gets "full" after a few bites. In ER, CXR and BNP suggest CHF. BP and troponin elevated   RD consulted for nutrition education regarding new onset CHF.  Spoke with patient at bedside. He reports some early satiety starting Thursday. Reports some weight loss with a UBW of 170 pounds. Appears he is unsure of his weight, weighed 156 pounds upon admission. Normally reports eating eggs, bacon, grits, sausage, grits for breakfast. Eats sandwiches for lunch. Eats chicken or steak with vegetables and starch for dinner. RD Exam WNL. Reports abdomen was distended and tight, but feels better, more comfortable now. Asking for solid food and if he will remain on clear liquid diet.  Labs: BNP 4154  Medications: 20K+ BID  RD provided "Low Sodium Nutrition Therapy" handout from the Academy of [employer] Dietetics. Reviewed patient's dietary recall. Provided examples on ways to decrease sodium intake in diet. Discouraged intake of processed foods and use of salt shaker. Encouraged fresh fruits and vegetables as well as whole grain sources of carbohydrates to maximize fiber intake.   RD discussed why it is important for patient to adhere to diet recommendations, and emphasized the role of fluids, foods to avoid, and importance of weighing self  daily. Teach back method used.  Expect fair compliance.  NUTRITION - FOCUSED PHYSICAL EXAM:    Most Recent Value  Orbital Region  No depletion  Upper Arm Region  No depletion  Thoracic and Lumbar Region  No depletion  Buccal Region  No depletion  Temple Region  No depletion  Clavicle Bone Region  No depletion  Clavicle and Acromion Bone Region  No depletion  Scapular Bone Region  No depletion  Dorsal Hand  No depletion  Patellar Region  No depletion  Anterior Thigh Region  No depletion  Posterior Calf Region  No depletion  Edema (RD Assessment)  None  Hair  Reviewed  Eyes  Reviewed  Mouth  Reviewed  Skin  Reviewed  Nails  Reviewed       Diet Order:  Diet clear liquid Room service appropriate? Yes; Fluid consistency: Thin  EDUCATION NEEDS:   Education needs have been addressed  Skin:  Skin Assessment: Reviewed RN Assessment  Last BM:  06/08/2017  Height:   Ht Readings from Last 1 Encounters:  06/09/17 6\' 1"  (1.854 m)    Weight:   Wt Readings from Last 1 Encounters:  06/09/17 156 lb 3.2 oz (70.9 kg)    Ideal Body Weight:  83.63 kg  BMI:  Body mass index is 20.61 kg/m.  Estimated Nutritional Needs:   Kcal:  2100-2400 calories (MSJ x1.3-1.5)  Protein:  92-106 grams  Fluid:  per MD given BNP 4154   Manuel AnoWilliam M. Shelene Krage, MS, RD LDN Inpatient Clinical Dietitian Pager [phone]

## 2017-06-10 DIAGNOSIS — K6812 Psoas muscle abscess: Secondary | ICD-10-CM

## 2017-06-10 DIAGNOSIS — I5021 Acute systolic (congestive) heart failure: Secondary | ICD-10-CM

## 2017-06-10 DIAGNOSIS — I214 Non-ST elevation (NSTEMI) myocardial infarction: Secondary | ICD-10-CM

## 2017-06-10 LAB — BASIC METABOLIC PANEL
ANION GAP: 9 (ref 5–15)
BUN: 18 mg/dL (ref 6–20)
CHLORIDE: 100 mmol/L — AB (ref 101–111)
CO2: 27 mmol/L (ref 22–32)
Calcium: 8.3 mg/dL — ABNORMAL LOW (ref 8.9–10.3)
Creatinine, Ser: 1.73 mg/dL — ABNORMAL HIGH (ref 0.61–1.24)
GFR calc Af Amer: 50 mL/min — ABNORMAL LOW (ref >60)
GFR, EST NON AFRICAN AMERICAN: 43 mL/min — AB (ref >60)
GLUCOSE: 109 mg/dL — AB (ref 65–99)
Potassium: 4.3 mmol/L (ref 3.5–5.1)
Sodium: 136 mmol/L (ref 135–145)

## 2017-06-10 LAB — HEPARIN LEVEL (UNFRACTIONATED)
HEPARIN UNFRACTIONATED: 0.36 [IU]/mL (ref 0.30–0.70)
Heparin Unfractionated: 0.21 IU/mL — ABNORMAL LOW (ref 0.30–0.70)
Heparin Unfractionated: 0.4 IU/mL (ref 0.30–0.70)

## 2017-06-10 LAB — CBC
HEMATOCRIT: 42.1 % (ref 40.0–52.0)
HEMOGLOBIN: 13.8 g/dL (ref 13.0–18.0)
MCH: 28.6 pg (ref 26.0–34.0)
MCHC: 32.7 g/dL (ref 32.0–36.0)
MCV: 87.4 fL (ref 80.0–100.0)
Platelets: 338 10*3/uL (ref 150–440)
RBC: 4.82 MIL/uL (ref 4.40–5.90)
RDW: 17.5 % — ABNORMAL HIGH (ref 11.5–14.5)
WBC: 5.7 10*3/uL (ref 3.8–10.6)

## 2017-06-10 LAB — GLUCOSE, CAPILLARY: GLUCOSE-CAPILLARY: 98 mg/dL (ref 65–99)

## 2017-06-10 MED ORDER — SODIUM CHLORIDE 0.9 % IV SOLN
25.0000 mg | Freq: Two times a day (BID) | INTRAVENOUS | Status: DC | PRN
  Filled 2017-06-10: qty 0.5

## 2017-06-10 MED ORDER — ENSURE ENLIVE PO LIQD
237.0000 mL | Freq: Two times a day (BID) | ORAL | Status: DC
  Administered 2017-06-10 – 2017-06-12 (×2): 237 mL via ORAL

## 2017-06-10 MED ORDER — ADULT MULTIVITAMIN W/MINERALS CH
1.0000 | ORAL_TABLET | Freq: Every day | ORAL | Status: DC
  Administered 2017-06-10 – 2017-06-12 (×3): 1 via ORAL
  Filled 2017-06-10 (×3): qty 1

## 2017-06-10 MED ORDER — DIPHENHYDRAMINE HCL 50 MG/ML IJ SOLN
12.5000 mg | Freq: Two times a day (BID) | INTRAMUSCULAR | Status: DC | PRN
  Administered 2017-06-10 – 2017-06-11 (×2): 12.5 mg via INTRAVENOUS
  Filled 2017-06-10 (×2): qty 1

## 2017-06-10 MED ORDER — HEPARIN BOLUS VIA INFUSION
1000.0000 [IU] | Freq: Once | INTRAVENOUS | Status: AC
  Administered 2017-06-10: 1000 [IU] via INTRAVENOUS
  Filled 2017-06-10: qty 1000

## 2017-06-10 MED ORDER — HYDROCERIN EX CREA
TOPICAL_CREAM | Freq: Two times a day (BID) | CUTANEOUS | Status: DC
  Administered 2017-06-10 – 2017-06-11 (×2): via TOPICAL
  Filled 2017-06-10: qty 113

## 2017-06-10 NOTE — Progress Notes (Signed)
[facility] Date of Admission:  06/08/2017     ID: Manuel Rosales is a 51 y.o. male with HIV, new onset CHF and Psoas abscess Principal Problem:   CHF (congestive heart failure) (HCC) Active Problems:   Elevated troponin   Early satiety   HIV disease (HCC)   Psoas abscess (HCC)   Subjective: No fevers,   ROS  Eleven systems are reviewed and negative except per hpi  Medications:  Antibiotics Given (last 72 hours)    Date/Time Action Medication Dose Rate   06/08/17 1819 New Bag/Given   piperacillin-tazobactam (ZOSYN) IVPB 3.375 g 3.375 g 100 mL/hr   06/08/17 1858 New Bag/Given   vancomycin (VANCOCIN) IVPB 1000 mg/200 mL premix 1,000 mg 200 mL/hr   06/09/17 0007 New Bag/Given   piperacillin-tazobactam (ZOSYN) IVPB 3.375 g 3.375 g 12.5 mL/hr   06/09/17 0156 New Bag/Given   vancomycin (VANCOCIN) 1,500 mg in sodium chloride 0.9 % 500 mL IVPB 1,500 mg 250 mL/hr   06/09/17 0854 New Bag/Given   piperacillin-tazobactam (ZOSYN) IVPB 3.375 g 3.375 g 12.5 mL/hr   06/09/17 1522 New Bag/Given   piperacillin-tazobactam (ZOSYN) IVPB 3.375 g 3.375 g 12.5 mL/hr   06/09/17 1807 New Bag/Given   vancomycin (VANCOCIN) 1,500 mg in sodium chloride 0.9 % 500 mL IVPB 1,500 mg 250 mL/hr   06/10/17 0059 New Bag/Given   piperacillin-tazobactam (ZOSYN) IVPB 3.375 g 3.375 g 12.5 mL/hr   06/10/17 0847 New Bag/Given   piperacillin-tazobactam (ZOSYN) IVPB 3.375 g 3.375 g 12.5 mL/hr     . aspirin EC  81 mg Oral Daily  . atorvastatin  40 mg Oral q1800  . carvedilol  6.25 mg Oral BID WC  . docusate sodium  100 mg Oral BID  . feeding supplement (ENSURE ENLIVE)  237 mL Oral BID BM  . multivitamin with minerals  1 tablet Oral Daily  . nystatin  5 mL Oral QID  . potassium chloride  20 mEq Oral BID    Objective: Vital signs in last 24 hours: Temp:  [98.2 F (36.8 C)-98.5 F (36.9 C)] 98.3 F (36.8 C) (11/06 0755) Pulse Rate:  [95-103] 95 (11/06 0755) Resp:   [18-24] 24 (11/06 0755) BP: (133-139)/(95-101) 133/96 (11/06 0755) SpO2:  [96 %-100 %] 96 % (11/06 0755) Weight:  [67.5 kg (148 lb 12.8 oz)] 67.5 kg (148 lb 12.8 oz) (11/06 0403) Physical Exam  Constitutional: He is oriented to person, place, and time. He appears well-developed and well-nourished. No distress.  HENT:  Mouth/Throat: Oropharynx is clear and moist. No oropharyngeal exudate.  Cardiovascular: Normal rate, regular rhythm and normal heart sounds. Exam reveals no gallop and no friction rub.  No murmur heard.  Pulmonary/Chest: Effort normal and breath sounds normal. No respiratory distress. He has no wheezes.  Abdominal: Soft. Bowel sounds are normal. He exhibits no distension. There is no tenderness.  Lymphadenopathy:  He has no cervical adenopathy.  Neurological: He is alert and oriented to person, place, and time.  Skin: Skin is warm and dry. No rash noted. No erythema.  Psychiatric: He has a normal mood and affect. His behavior is normal.     Lab Results Recent Labs    06/09/17 0513 06/10/17 0333  WBC 4.9 5.7  HGB 13.5 13.8  HCT 41.3 42.1  NA 138 136  K 4.2 4.3  CL 104 100*  CO2 26 27  BUN 23* 18  CREATININE 1.45* 1.73*    Microbiology: No results found for this or any  previous visit.  Studies/Results: Ct Abdomen Pelvis W Contrast  Result Date: 06/08/2017 CLINICAL DATA:  51 y.o. male patient with a history of HIV as well as hypertension and small bowel obstruction was presented to the emergency department today with 2-3 days of shortness of breath and weakness. The patient is also complaining of diffuse abdominal pain with nausea and inability to tolerate any large amount of food. Patient denies any radiation of his abdominal pain. Denies any chest pain. Patient says that his shortness of breath is worse when he has exerting himself or lying flat EXAM: CT ABDOMEN AND PELVIS WITH CONTRAST TECHNIQUE: Multidetector CT imaging of the abdomen and pelvis was performed  using the standard protocol following bolus administration of intravenous contrast. CONTRAST:  100mL ISOVUE-300 IOPAMIDOL (ISOVUE-300) INJECTION 61% COMPARISON:  01/10/2017 FINDINGS: Lower chest: Heart is mildly enlarged. Trace right pleural effusion. Mild dependent subsegmental atelectasis in the right lung base. Fat containing right Bochdalek hernia. Hepatobiliary: No focal liver abnormality is seen. No gallstones, gallbladder wall thickening, or biliary dilatation. Pancreas: Unremarkable. No pancreatic ductal dilatation or surrounding inflammatory changes. Spleen: Normal in size without focal abnormality. Adrenals/Urinary Tract: Adrenal glands are unremarkable. Kidneys are normal, without renal calculi, focal lesion, or hydronephrosis. Bladder is unremarkable. Stomach/Bowel: Stomach, small bowel and colon are unremarkable. Appendix has been removed. Vascular/Lymphatic: Mild aortic atherosclerosis. There are prominent and mildly enlarged external iliac and inguinal lymph nodes. Largest left inguinal node is 15 mm in short axis. Largest right inguinal node is also 15 mm in short axis. Reproductive: Unremarkable. Other: There is a heterogeneous fluid collection extending from the lateral soleus muscle through the iliacus muscle into the right anterior pelvis. It measures approximately 11.2 x 5.2 x 6.8 cm. There are mild adjacent inflammatory changes in a trace amount of fluid. Musculoskeletal: No fracture or acute finding. No CT evidence of discitis or SI joint infection. IMPRESSION: 1. Heterogeneous lobulated fluid collection involving the right psoas and iliacus muscles, measuring 11.2 cm in greatest dimension, consistent with an abscess. 2. No other acute abnormalities within the abdomen or pelvis. 3. Trace right pleural effusion. 4. Mild cardiomegaly. 5. Mild aortic atherosclerosis. Electronically Signed   By: Amie Portlandavid  Ormond M.D.   On: 06/08/2017 16:27   Assessment/Plan: Manuel Rosales is a 51 y.o. male with  HIV AIDS - out of care since 2015, most recent CD4 114, now admitted with DOE and found to have Elevated BNP, cxr with CHF findings, EF 20-25% and CT abd with a 11 cm psoas fluid collection.  Clinically improving.  He likely has CHF related to untreated HTN and unrelated to HIV. However his psoas fluid collection will likely need further evaluation after acute issues addressed. He has thrush as well. Will check labs and [facility]  Recommendations I reviewed CT with IR  - would suggest aspiration of abscess with drain placement at some point when stable as he is immunocompromised and will need work up prior to initiation of treatment.  However would deal with cardiac issues first Check cd4, PCR, genotype, Crypto antigen Nystatin for thrush. Will need bactrim for PCP PPx but will hold for now given fluctuating renal function   Thank you very much for the consult. Will follow with you.  Mick SellDavid P Shakeria Robinette   06/10/2017, 1:37 PM

## 2017-06-10 NOTE — Plan of Care (Signed)
Patient has had adequate output in response to receiving lasix.

## 2017-06-10 NOTE — Progress Notes (Signed)
ANTICOAGULATION CONSULT NOTE  Pharmacy Consult for Heparin Indication: chest pain/ACS  No Known Allergies  Patient Measurements: Height: 6\' 1"  (185.4 cm) Weight: 148 lb 12.8 oz (67.5 kg) IBW/kg (Calculated) : 79.9 Heparin Dosing Weight: 71 kg  Vital Signs: Temp: 98.3 F (36.8 C) (11/06 0755) Temp Source: Oral (11/06 0755) BP: 133/96 (11/06 0755) Pulse Rate: 95 (11/06 0755)  Labs: Recent Labs    06/08/17 1119  06/08/17 2307 06/09/17 0513 06/09/17 1222 06/09/17 1857 06/10/17 0333 06/10/17 1039  HGB 13.3  --   --  13.5  --   --  13.8  --   HCT 41.6  --   --  41.3  --   --  42.1  --   PLT 356  --   --  327  --   --  338  --   APTT  --   --   --   --  33  --   --   --   LABPROT  --   --   --   --  15.3*  --   --   --   INR  --   --   --   --  1.22  --   --   --   HEPARINUNFRC  --   --   --   --   --  0.22* 0.21* 0.36  CREATININE 1.48*  --   --  1.45*  --   --  1.73*  --   TROPONINI  --    < > 1.66* 1.87*  --  1.59*  --   --    < > = values in this interval not displayed.    Estimated Creatinine Clearance: 47.1 mL/min (A) (by C-G formula based on SCr of 1.73 mg/dL (H)).   Medical History: Past Medical History:  Diagnosis Date  . Dermatitis   . HIV (human immunodeficiency virus infection) (HCC)   . Hypertension     Assessment: 51 y/o M with a h/o HIV and no known h/o cardiac history admitted with SOB and elevated troponin .  Goal of Therapy:  Heparin level 0.3-0.7 units/ml Monitor platelets by anticoagulation protocol: Yes   Plan:  Continue heparin infusion at current rate and recheck a HL in 6 hours. Per cardiology, plan is to continue heparin x a total of 48 hours which should be around 1200 11/7.   Luisa HartScott Dorethea Strubel, PharmD Clinical Pharmacist  06/10/2017

## 2017-06-10 NOTE — Care Management (Addendum)
Contacted [facility] to make follow up appointment and informed that the practices does not accept medicaid unless also has another payor in from of it such as medicare.  Discussed that this practice is listed on the medicaid card and informed they can not accept patient.  [facility]  Appointment for Nov 14 at 1pm. Agency will need discharge summary as this will be the first appointment

## 2017-06-10 NOTE — Progress Notes (Signed)
[facility] - Learned at [facility]   PATIENT NAME: Manuel Rosales    MR#:  [id number]  DATE OF BIRTH:  08/09/1965  SUBJECTIVE:  Came in after having increasing shortness of breath chest pain and poor appetite for 4 days Breathing much improved today. REVIEW OF SYSTEMS:   Review of Systems  Constitutional: Negative for chills, fever and weight loss.  HENT: Negative for ear discharge, ear pain and nosebleeds.   Eyes: Negative for blurred vision, pain and discharge.  Respiratory: Positive for shortness of breath. Negative for sputum production, wheezing and stridor.   Cardiovascular: Positive for chest pain and PND. Negative for palpitations and orthopnea.  Gastrointestinal: Negative for abdominal pain, diarrhea, nausea and vomiting.  Genitourinary: Negative for frequency and urgency.  Musculoskeletal: Negative for back pain and joint pain.  Neurological: Negative for sensory change, speech change, focal weakness and weakness.  Psychiatric/Behavioral: Negative for depression and hallucinations. The patient is not nervous/anxious.    Tolerating Diet:yes Tolerating PT: not needed  DRUG ALLERGIES:  No Known Allergies  VITALS:  Blood pressure (!) 133/96, pulse 95, temperature 98.3 F (36.8 C), temperature source Oral, resp. rate (!) 24, height 6\' 1"  (1.854 m), weight 67.5 kg (148 lb 12.8 oz), SpO2 96 %.  PHYSICAL EXAMINATION:   Physical Exam  GENERAL:  51 y.o.-year-old patient lying in the bed with no acute distress.  EYES: Pupils equal, round, reactive to light and accommodation. No scleral icterus. Extraocular muscles intact.  HEENT: Head atraumatic, normocephalic. Oropharynx and nasopharynx clear.  NECK:  Supple, no jugular venous distention. No thyroid enlargement, no tenderness.  LUNGS: Normal breath sounds bilaterally, no wheezing, rales, rhonchi. No use of accessory muscles of respiration.  CARDIOVASCULAR: S1, S2 normal. No murmurs, rubs, or  gallops.  ABDOMEN: Soft, nontender, nondistended. Bowel sounds present. No organomegaly or mass.  EXTREMITIES: No cyanosis, clubbing or edema b/l.    NEUROLOGIC: Cranial nerves II through XII are intact. No focal Motor or sensory deficits b/l.   PSYCHIATRIC:  patient is alert and oriented x 3.  SKIN: No obvious rash, lesion, or ulcer.   LABORATORY PANEL:  CBC Recent Labs  Lab 06/10/17 0333  WBC 5.7  HGB 13.8  HCT 42.1  PLT 338    Chemistries  Recent Labs  Lab 06/09/17 0513 06/10/17 0333  NA 138 136  K 4.2 4.3  CL 104 100*  CO2 26 27  GLUCOSE 116* 109*  BUN 23* 18  CREATININE 1.45* 1.73*  CALCIUM 8.0* 8.3*  AST 44*  --   ALT 27  --   ALKPHOS 65  --   BILITOT 1.1  --    Cardiac Enzymes Recent Labs  Lab 06/09/17 1857  TROPONINI 1.59*   RADIOLOGY:  Ct Abdomen Pelvis W Contrast  Result Date: 06/08/2017 CLINICAL DATA:  51 y.o. male patient with a history of HIV as well as hypertension and small bowel obstruction was presented to the emergency department today with 2-3 days of shortness of breath and weakness. The patient is also complaining of diffuse abdominal pain with nausea and inability to tolerate any large amount of food. Patient denies any radiation of his abdominal pain. Denies any chest pain. Patient says that his shortness of breath is worse when he has exerting himself or lying flat EXAM: CT ABDOMEN AND PELVIS WITH CONTRAST TECHNIQUE: Multidetector CT imaging of the abdomen and pelvis was performed using the standard protocol following bolus administration of intravenous contrast. CONTRAST:  ISOVUE-300 IOPAMIDOL (ISOVUE-300) INJECTION  61% COMPARISON:  01/10/2017 FINDINGS: Lower chest: Heart is mildly enlarged. Trace right pleural effusion. Mild dependent subsegmental atelectasis in the right lung base. Fat containing right Bochdalek hernia. Hepatobiliary: No focal liver abnormality is seen. No gallstones, gallbladder wall thickening, or biliary dilatation.  Pancreas: Unremarkable. No pancreatic ductal dilatation or surrounding inflammatory changes. Spleen: Normal in size without focal abnormality. Adrenals/Urinary Tract: Adrenal glands are unremarkable. Kidneys are normal, without renal calculi, focal lesion, or hydronephrosis. Bladder is unremarkable. Stomach/Bowel: Stomach, small bowel and colon are unremarkable. Appendix has been removed. Vascular/Lymphatic: Mild aortic atherosclerosis. There are prominent and mildly enlarged external iliac and inguinal lymph nodes. Largest left inguinal node is 15 mm in short axis. Largest right inguinal node is also 15 mm in short axis. Reproductive: Unremarkable. Other: There is a heterogeneous fluid collection extending from the lateral soleus muscle through the iliacus muscle into the right anterior pelvis. It measures approximately 11.2 x 5.2 x 6.8 cm. There are mild adjacent inflammatory changes in a trace amount of fluid. Musculoskeletal: No fracture or acute finding. No CT evidence of discitis or SI joint infection. IMPRESSION: 1. Heterogeneous lobulated fluid collection involving the right psoas and iliacus muscles, measuring 11.2 cm in greatest dimension, consistent with an abscess. 2. No other acute abnormalities within the abdomen or pelvis. 3. Trace right pleural effusion. 4. Mild cardiomegaly. 5. Mild aortic atherosclerosis. Electronically Signed   By: Amie Portlandavid  Ormond M.D.   On: 06/08/2017 16:27   ASSESSMENT AND PLAN:  Manuel Rosales is a 51 y.o. male has a past medical history significant for HIV and HTN off meds now with 1 week hx of progressive SOB and DOE. Sleeping in a chair. Not eating well as he gets "full" after a few bites. In ER, CXR and BNP suggest CHF. BP and troponin elevated  1. NSTEMI: -Currently chest pain free.  Patient seen by cardiology recommends IV heparin drip.  Spoke with surgery Dr. Excell Seltzerooper who is okay with starting heparin drip. -Patient currently is not septic.  Right psoas fluid  collection could be an abscess however patient is not septic white count is normal and no fever. -Continue to cycle troponin until peak -Start heparin gtt per pharmacy -EKG with changes consistent with ischemia  -Echo shows severe cardiomyopathy EF of 20-25%. -ASA -Patient refuses nitro patch, could use Imdur if needed  -Add Lipitor 40 mg daily  2. Acute CHF acute systolic -Systolic presumed ICM given troponin elevation -Renal function stable with diuresis - continue IV Lasix with KCl repletion  -Output more than 4 L over 24 hours  3. R right Psoas muscle fluid collection versus abscess: -Seen by Dr. Sampson GoonFitzgerald and surgery Dr. Excell Seltzerooper await the recommendation -Patient on empiric antibiotics--- discontinued Vanco and Zosyn after discussing with ID. -ID to discuss with IR regarding possible needle aspiration from psoas fluid collection -She remains afebrile  4. HIV: -Per ID -[facility]  5. AKI: -Monitor renal function with diuresis   6. HTN: -Improved -Continue current medications     Case discussed with Care Management/Social Worker. Management plans discussed with the patient, family and they are in agreement.  CODE STATUS: Full  DVT Prophylaxis: Heparin  TOTAL TIME TAKING CARE OF THIS PATIENT: 30 minutes.  >50% time spent on counselling and coordination of care  POSSIBLE D/C2-3DAYS, DEPENDING ON CLINICAL CONDITION.  Note: This dictation was prepared with Dragon dictation along with smaller phrase technology. Any transcriptional errors that result from this  process are unintentional.  Zaim Nitta M.D on 06/10/2017 at 3:02 PM  Between 7am to 6pm - Pager - [phone]  After 6pm go to [email or web address] - password [email or web address]  [facility]  Office  [phone]  CC: Primary care physician; Patient, No Pcp Per

## 2017-06-10 NOTE — Progress Notes (Addendum)
Nutrition Follow Up note   DOCUMENTATION CODES:   Not applicable  INTERVENTION:   Pt at refeeding risk; recommend monitor Mg, P, and K  Ensure Enlive po BID, each supplement provides 350 kcal and 20 grams of protein  MVI  NUTRITION DIAGNOSIS:   Inadequate oral intake related to poor appetite as evidenced by per patient/family report.  -improving   GOAL:   Patient will meet greater than or equal to 90% of their needs   -progressing   MONITOR:   PO intake, Supplement acceptance, I & O's, Labs, Weight trends  ASSESSMENT:   Manuel Rosales is a 51 y.o. male has a past medical history significant for HIV and HTN off meds now with 1 week hx of progressive SOB and DOE. Sleeping in a chair. Not eating well as he gets "full" after a few bites. In ER, CXR and BNP suggest CHF. BP and troponin elevated   Pt doing well; eating 100% of meals. RD will add Ensure and MVI to help pt meet his estimated needs. Pt with 8lb weight loss since admit likely r/t fluid changes. Pt was provided with heart healthy nutrition therapy on 11/5.    Medications reviewed and include: aspirin, colace, KCl, heparin  Labs reviewed: Cl 100(L), creat 1.73(H), Ca 8.3(L) BNP 4154(H)- 11/4  Diet Order:  Diet Heart Room service appropriate? Yes; Fluid consistency: Thin  EDUCATION NEEDS:   Education needs have been addressed  Skin:  Reviewed RN Assessment  Last BM:  11/5  Height:   Ht Readings from Last 1 Encounters:  06/09/17 6\' 1"  (1.854 m)    Weight:   Wt Readings from Last 1 Encounters:  06/10/17 148 lb 12.8 oz (67.5 kg)    Ideal Body Weight:  83.63 kg  BMI:  Body mass index is 19.63 kg/m.  Estimated Nutritional Needs:   Kcal:  2100-2400 calories (MSJ x1.3-1.5)  Protein:  100-114g/day   Fluid:  per MD   Betsey Holidayasey Arneshia Ade MS, RD, LDN Pager #[phone] After Hours Pager: [phone]

## 2017-06-10 NOTE — Progress Notes (Signed)
CC: psoas fluid collection Subjective: Patient has apsoas fluid collection believed to be an abscess but lacks any clinical findings of infection. He denies fevers or chills has no abdominal pain no back pain no nausea vomiting.  Objective: Vital signs in last 24 hours: Temp:  [98.2 F (36.8 C)-98.5 F (36.9 C)] 98.3 F (36.8 C) (11/06 0755) Pulse Rate:  [95-103] 95 (11/06 0755) Resp:  [18-24] 24 (11/06 0755) BP: (133-139)/(95-101) 133/96 (11/06 0755) SpO2:  [96 %-100 %] 96 % (11/06 0755) Weight:  [148 lb 12.8 oz (67.5 kg)] 148 lb 12.8 oz (67.5 kg) (11/06 0403) Last BM Date: 06/09/17  Intake/Output from previous day: 11/05 0701 - 11/06 0700 In: 3087 [P.O.:2280; I.V.:107; IV Piggyback:700] Out: 3900 [Urine:3900] Intake/Output this shift: Total I/O In: -  Out: 325 [Urine:325]  Physical exam:  Afebrile No acute distress No CVA tenderness No abdominal tenderness nondistended nontympanitic  Lab Results: CBC  Recent Labs    06/09/17 0513 06/10/17 0333  WBC 4.9 5.7  HGB 13.5 13.8  HCT 41.3 42.1  PLT 327 338   BMET Recent Labs    06/09/17 0513 06/10/17 0333  NA 138 136  K 4.2 4.3  CL 104 100*  CO2 26 27  GLUCOSE 116* 109*  BUN 23* 18  CREATININE 1.45* 1.73*  CALCIUM 8.0* 8.3*   PT/INR Recent Labs    06/09/17 1222  LABPROT 15.3*  INR 1.22   ABG No results for input(s): PHART, HCO3 in the last 72 hours.  Invalid input(s): PCO2, PO2  Studies/Results: Dg Chest 2 View  Result Date: 06/08/2017 CLINICAL DATA:  Short of breath for 3 days EXAM: CHEST  2 VIEW COMPARISON:  07/19/2013 FINDINGS: The heart is moderately enlarged. Vascular congestion. Mild interstitial edema at the lung bases. No pneumothorax or pleural effusion. IMPRESSION: Mild CHF. Electronically Signed   By: Jolaine ClickArthur  Hoss M.D.   On: 06/08/2017 12:23   Ct Abdomen Pelvis W Contrast  Result Date: 06/08/2017 CLINICAL DATA:  51 y.o. male patient with a history of HIV as well as hypertension and  small bowel obstruction was presented to the emergency department today with 2-3 days of shortness of breath and weakness. The patient is also complaining of diffuse abdominal pain with nausea and inability to tolerate any large amount of food. Patient denies any radiation of his abdominal pain. Denies any chest pain. Patient says that his shortness of breath is worse when he has exerting himself or lying flat EXAM: CT ABDOMEN AND PELVIS WITH CONTRAST TECHNIQUE: Multidetector CT imaging of the abdomen and pelvis was performed using the standard protocol following bolus administration of intravenous contrast. CONTRAST:  100mL ISOVUE-300 IOPAMIDOL (ISOVUE-300) INJECTION 61% COMPARISON:  01/10/2017 FINDINGS: Lower chest: Heart is mildly enlarged. Trace right pleural effusion. Mild dependent subsegmental atelectasis in the right lung base. Fat containing right Bochdalek hernia. Hepatobiliary: No focal liver abnormality is seen. No gallstones, gallbladder wall thickening, or biliary dilatation. Pancreas: Unremarkable. No pancreatic ductal dilatation or surrounding inflammatory changes. Spleen: Normal in size without focal abnormality. Adrenals/Urinary Tract: Adrenal glands are unremarkable. Kidneys are normal, without renal calculi, focal lesion, or hydronephrosis. Bladder is unremarkable. Stomach/Bowel: Stomach, small bowel and colon are unremarkable. Appendix has been removed. Vascular/Lymphatic: Mild aortic atherosclerosis. There are prominent and mildly enlarged external iliac and inguinal lymph nodes. Largest left inguinal node is 15 mm in short axis. Largest right inguinal node is also 15 mm in short axis. Reproductive: Unremarkable. Other: There is a heterogeneous fluid collection extending from the lateral  soleus muscle through the iliacus muscle into the right anterior pelvis. It measures approximately 11.2 x 5.2 x 6.8 cm. There are mild adjacent inflammatory changes in a trace amount of fluid. Musculoskeletal:  No fracture or acute finding. No CT evidence of discitis or SI joint infection. IMPRESSION: 1. Heterogeneous lobulated fluid collection involving the right psoas and iliacus muscles, measuring 11.2 cm in greatest dimension, consistent with an abscess. 2. No other acute abnormalities within the abdomen or pelvis. 3. Trace right pleural effusion. 4. Mild cardiomegaly. 5. Mild aortic atherosclerosis. Electronically Signed   By: Amie Portlandavid  Ormond M.D.   On: 06/08/2017 16:27    Anti-infectives: Anti-infectives (From admission, onward)   Start     Dose/Rate Route Frequency Ordered Stop   06/09/17 0100  vancomycin (VANCOCIN) 1,500 mg in sodium chloride 0.9 % 500 mL IVPB     1,500 mg 250 mL/hr over 120 Minutes Intravenous Every 18 hours 06/08/17 1647     06/08/17 2200  piperacillin-tazobactam (ZOSYN) IVPB 3.375 g     3.375 g 12.5 mL/hr over 240 Minutes Intravenous Every 8 hours 06/08/17 1647     06/08/17 1700  vancomycin (VANCOCIN) IVPB 1000 mg/200 mL premix     1,000 mg 200 mL/hr over 60 Minutes Intravenous  Once 06/08/17 1645 06/08/17 1958   06/08/17 1700  piperacillin-tazobactam (ZOSYN) IVPB 3.375 g     3.375 g 100 mL/hr over 30 Minutes Intravenous  Once 06/08/17 1645 06/08/17 1905      Assessment/Plan:  Labs reviewed Patient clinically has no signs of a psoas abscess but this was sounded found on CT scan. While it may be an abscess is not a lot of enhancement around it nor is there signs of gas bubbles etc. With his other comorbidities I would not recommend needle aspiration unless he were to show signs of fever increasing white blood cell count or abdominal or back pain. Discussed with Dr. Enedina FinnerSona Patel. No surgical indications at this time we will sign off.  Lattie Hawichard E Glena Pharris, MD, FACS  06/10/2017

## 2017-06-10 NOTE — Progress Notes (Signed)
Progress Note  Patient Name: Manuel Rosales Date of Encounter: 06/10/2017  Primary Cardiologist: [facility] consult by [facility]  Subjective   SOB is improving. No chest pain. Echo showed EF 20-25%, diffuse HK, Gr1DD, mildly dilated RV with normal systolic function, mildly dilated RA, mild to moderate TR, PASP 40 mmHg. UOP -813 for the past 24 hours and 1.4 L for the admission. Renal function worsening with diuresis 1.45-->1.73. Weight 156-->148 (question validity).   Inpatient Medications    Scheduled Meds: . aspirin EC  81 mg Oral Daily  . atorvastatin  40 mg Oral q1800  . carvedilol  6.25 mg Oral BID WC  . docusate sodium  100 mg Oral BID  . nystatin  5 mL Oral QID  . pantoprazole (PROTONIX) IV  40 mg Intravenous Q12H  . potassium chloride  20 mEq Oral BID   Continuous Infusions: . heparin 1,150 Units/hr (06/10/17 0848)  . piperacillin-tazobactam (ZOSYN)  IV 3.375 g (06/10/17 0847)  . vancomycin Stopped (06/09/17 2007)   PRN Meds: acetaminophen **OR** acetaminophen, bisacodyl, diazepam, hydrALAZINE, ipratropium-albuterol, nitroGLYCERIN, ondansetron **OR** ondansetron (ZOFRAN) IV   Vital Signs    Vitals:   06/09/17 1941 06/10/17 0403 06/10/17 0404 06/10/17 0755  BP: (!) 139/97 (!) 134/101 (!) 135/95 (!) 133/96  Pulse: (!) 103 96 97 95  Resp: 18 18  (!) 24  Temp: 98.5 F (36.9 C) 98.2 F (36.8 C)  98.3 F (36.8 C)  TempSrc: Oral Oral  Oral  SpO2: 97% 100% 97% 96%  Weight:  148 lb 12.8 oz (67.5 kg)    Height:        Intake/Output Summary (Last 24 hours) at 06/10/2017 0854 Last data filed at 06/10/2017 0735 Gross per 24 hour  Intake 3087 ml  Output 3025 ml  Net 62 ml   Filed Weights   06/08/17 1118 06/09/17 0458 06/10/17 0403  Weight: 170 lb (77.1 kg) 156 lb 3.2 oz (70.9 kg) 148 lb 12.8 oz (67.5 kg)    Telemetry    NSR with TWI - Personally Reviewed  ECG    n/a - Personally Reviewed  Physical Exam   GEN: No acute distress.   Neck: Mildly  elevated JVD. Cardiac: RRR, no murmurs, rubs, or gallops.  Respiratory: Faint bibasilar crackles, left > right.  GI: Soft, nontender, non-distended.   MS: No edema; No deformity. Neuro:  Alert and oriented x 3; Nonfocal.  Psych: Normal affect.  Labs    Chemistry Recent Labs  Lab 06/08/17 1119 06/09/17 0513 06/10/17 0333  NA 139 138 136  K 4.2 4.2 4.3  CL 104 104 100*  CO2 26 26 27   GLUCOSE 117* 116* 109*  BUN 26* 23* 18  CREATININE 1.48* 1.45* 1.73*  CALCIUM 8.5* 8.0* 8.3*  PROT 8.1 7.4  --   ALBUMIN 2.7* 2.5*  --   AST 50* 44*  --   ALT 30 27  --   ALKPHOS 77 65  --   BILITOT 0.9 1.1  --   GFRNONAA 52* 54* 43*  GFRAA >60 >60 50*  ANIONGAP 9 8 9      Hematology Recent Labs  Lab 06/08/17 1119 06/09/17 0513 06/10/17 0333  WBC 4.2 4.9 5.7  RBC 4.73 4.74 4.82  HGB 13.3 13.5 13.8  HCT 41.6 41.3 42.1  MCV 88.0 87.1 87.4  MCH 28.1 28.4 28.6  MCHC 31.9* 32.6 32.7  RDW 18.0* 17.7* 17.5*  PLT 356 327 338    Cardiac Enzymes Recent Labs  Lab 06/08/17 1733 06/08/17 2307 06/09/17 0513 06/09/17 1857  TROPONINI 1.68* 1.66* 1.87* 1.59*   No results for input(s): TROPIPOC in the last 168 hours.   BNP Recent Labs  Lab 06/08/17 1127  BNP 4,154.0*     DDimer No results for input(s): DDIMER in the last 168 hours.   Radiology    Dg Chest 2 View  Result Date: 06/08/2017 IMPRESSION: Mild CHF. Electronically Signed   By: Jolaine ClickArthur  Hoss M.D.   On: 06/08/2017 12:23   Ct Abdomen Pelvis W Contrast  Result Date: 06/08/2017 IMPRESSION: 1. Heterogeneous lobulated fluid collection involving the right psoas and iliacus muscles, measuring 11.2 cm in greatest dimension, consistent with an abscess. 2. No other acute abnormalities within the abdomen or pelvis. 3. Trace right pleural effusion. 4. Mild cardiomegaly. 5. Mild aortic atherosclerosis. Electronically Signed   By: Amie Portlandavid  Ormond M.D.   On: 06/08/2017 16:27    Cardiac Studies   TTE 06/09/2017: Study Conclusions  -  Left ventricle: The cavity size was normal. There was mild   concentric hypertrophy. Systolic function was severely reduced.   The estimated ejection fraction was in the range of 20% to 25%.   Diffuse hypokinesis. Doppler parameters are consistent with   abnormal left ventricular relaxation (grade 1 diastolic   dysfunction). - Right ventricle: The cavity size was mildly dilated. Wall   thickness was normal. Systolic function was normal. - Right atrium: The atrium was mildly dilated. - Tricuspid valve: There was mild-moderate regurgitation. - Pulmonary arteries: Systolic pressure was mildly elevated. PA   peak pressure: 40 mm Hg (S). - Pericardium, extracardiac: A trivial pericardial effusion was   identified.  Patient Profile     51 y.o. male with history of HIV, SBO, and HTN who is being seen today for the evaluation of elevated troponin at the request of Dr. Judithann SheenSparks.   Assessment & Plan    1. NSTEMI: -No chest pain -Troponin peaked at 1.97, now down trending -Heparin gtt for a total of 48 hours -Echo showed severe LV dysfunction with diffuse HK -He will need an ischemic evaluation with LHC, though his AKI and and RLE abscess preclude this at this time -For now, medical management -ASA -Add Plavix, if ok with MD -Continue Coreg -Lipitor   2. 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 RLE abscess: -Medical management for now per IM  4. HIV: -Per ID/IM  5. AKI/possible cardiorenal syndrome: -Lasix and losartan on hold  For questions or updates, please contact [facility] Please consult  [email or web address] for contact info under Cardiology/STEMI.    Signed, [name], PA-C [facility] Pager: [phone] 06/10/2017, 8:54 AM

## 2017-06-10 NOTE — Progress Notes (Signed)
ANTICOAGULATION CONSULT NOTE  Pharmacy Consult for Heparin Indication: chest pain/ACS  No Known Allergies  Patient Measurements: Height: 6\' 1"  (185.4 cm) Weight: 148 lb 12.8 oz (67.5 kg) IBW/kg (Calculated) : 79.9 Heparin Dosing Weight: 71 kg  Vital Signs: Temp: 98.5 F (36.9 C) (11/06 1736) Temp Source: Oral (11/06 1736) BP: 131/97 (11/06 1736) Pulse Rate: 97 (11/06 1736)  Labs: Recent Labs    06/08/17 1119  06/08/17 2307 06/09/17 0513 06/09/17 1222  06/09/17 1857 06/10/17 0333 06/10/17 1039 06/10/17 1713  HGB 13.3  --   --  13.5  --   --   --  13.8  --   --   HCT 41.6  --   --  41.3  --   --   --  42.1  --   --   PLT 356  --   --  327  --   --   --  338  --   --   APTT  --   --   --   --  33  --   --   --   --   --   LABPROT  --   --   --   --  15.3*  --   --   --   --   --   INR  --   --   --   --  1.22  --   --   --   --   --   HEPARINUNFRC  --   --   --   --   --    < > 0.22* 0.21* 0.36 0.40  CREATININE 1.48*  --   --  1.45*  --   --   --  1.73*  --   --   TROPONINI  --    < > 1.66* 1.87*  --   --  1.59*  --   --   --    < > = values in this interval not displayed.    Estimated Creatinine Clearance: 47.1 mL/min (A) (by C-G formula based on SCr of 1.73 mg/dL (H)).   Medical History: Past Medical History:  Diagnosis Date  . Dermatitis   . HIV (human immunodeficiency virus infection) (HCC)   . Hypertension     Assessment: 51 y/o M with a h/o HIV and no known h/o cardiac history admitted with SOB and elevated troponin .  Goal of Therapy:  Heparin level 0.3-0.7 units/ml Monitor platelets by anticoagulation protocol: Yes   Plan:  Continue heparin infusion at current rate and recheck a HL in the AM. Per cardiology, plan is to continue heparin x a total of 48 hours which should be around 1200 11/7.   Olene FlossMelissa D Bassheva Flury, Pharm.D, BCPS Clinical Pharmacist  06/10/2017

## 2017-06-11 ENCOUNTER — Inpatient Hospital Stay: Payer: Medicaid Other

## 2017-06-11 DIAGNOSIS — I5021 Acute systolic (congestive) heart failure: Secondary | ICD-10-CM

## 2017-06-11 DIAGNOSIS — R748 Abnormal levels of other serum enzymes: Secondary | ICD-10-CM

## 2017-06-11 LAB — CBC
HEMATOCRIT: 40.8 % (ref 40.0–52.0)
Hemoglobin: 13.3 g/dL (ref 13.0–18.0)
MCH: 28.5 pg (ref 26.0–34.0)
MCHC: 32.6 g/dL (ref 32.0–36.0)
MCV: 87.4 fL (ref 80.0–100.0)
Platelets: 347 10*3/uL (ref 150–440)
RBC: 4.67 MIL/uL (ref 4.40–5.90)
RDW: 17.8 % — ABNORMAL HIGH (ref 11.5–14.5)
WBC: 5.1 10*3/uL (ref 3.8–10.6)

## 2017-06-11 LAB — HELPER T-LYMPH-CD4 (ARMC ONLY)
% CD 4 Pos. Lymph.: 14.3 % — ABNORMAL LOW (ref 30.8–58.5)
Absolute CD 4 Helper: 100 /uL — ABNORMAL LOW (ref 359–1519)
BASOS: 1 %
Basophils Absolute: 0 10*3/uL (ref 0.0–0.2)
EOS (ABSOLUTE): 0.2 10*3/uL (ref 0.0–0.4)
EOS: 5 %
HEMATOCRIT: 41.1 % (ref 37.5–51.0)
HEMOGLOBIN: 13.2 g/dL (ref 13.0–17.7)
Immature Grans (Abs): 0 10*3/uL (ref 0.0–0.1)
Immature Granulocytes: 0 %
LYMPHS ABS: 0.7 10*3/uL (ref 0.7–3.1)
Lymphs: 13 %
MCH: 28.4 pg (ref 26.6–33.0)
MCHC: 32.1 g/dL (ref 31.5–35.7)
MCV: 89 fL (ref 79–97)
MONOCYTES: 8 %
Monocytes Absolute: 0.4 10*3/uL (ref 0.1–0.9)
NEUTROS ABS: 3.7 10*3/uL (ref 1.4–7.0)
Neutrophils: 73 %
Platelets: 399 10*3/uL — ABNORMAL HIGH (ref 150–379)
RBC: 4.64 x10E6/uL (ref 4.14–5.80)
RDW: 17.6 % — ABNORMAL HIGH (ref 12.3–15.4)
WBC: 5.1 10*3/uL (ref 3.4–10.8)

## 2017-06-11 LAB — GLUCOSE, CAPILLARY
GLUCOSE-CAPILLARY: 136 mg/dL — AB (ref 65–99)
GLUCOSE-CAPILLARY: 149 mg/dL — AB (ref 65–99)
Glucose-Capillary: 115 mg/dL — ABNORMAL HIGH (ref 65–99)

## 2017-06-11 LAB — MAGNESIUM: Magnesium: 2 mg/dL (ref 1.7–2.4)

## 2017-06-11 LAB — CRYPTOCOCCUS ANTIGEN, SERUM: CRYPTOCOCCUS ANTIGEN, SERUM: NEGATIVE

## 2017-06-11 LAB — RPR: RPR Ser Ql: NONREACTIVE

## 2017-06-11 LAB — HEPARIN LEVEL (UNFRACTIONATED): HEPARIN UNFRACTIONATED: 0.47 [IU]/mL (ref 0.30–0.70)

## 2017-06-11 LAB — PHOSPHORUS: Phosphorus: 3.8 mg/dL (ref 2.5–4.6)

## 2017-06-11 MED ORDER — BUPIVACAINE HCL (PF) 0.25 % IJ SOLN
INTRAMUSCULAR | Status: AC
  Filled 2017-06-11: qty 30

## 2017-06-11 MED ORDER — SODIUM CHLORIDE 0.9 % IV SOLN
INTRAVENOUS | Status: DC
  Administered 2017-06-11: 14:00:00 via INTRAVENOUS

## 2017-06-11 MED ORDER — MIDAZOLAM HCL 2 MG/2ML IJ SOLN
INTRAMUSCULAR | Status: AC
  Filled 2017-06-11: qty 2

## 2017-06-11 MED ORDER — HEPARIN SODIUM (PORCINE) 5000 UNIT/ML IJ SOLN
5000.0000 [IU] | Freq: Three times a day (TID) | INTRAMUSCULAR | Status: DC
  Administered 2017-06-11 – 2017-06-12 (×2): 5000 [IU] via SUBCUTANEOUS
  Filled 2017-06-11 (×2): qty 1

## 2017-06-11 MED ORDER — FENTANYL CITRATE (PF) 100 MCG/2ML IJ SOLN
INTRAMUSCULAR | Status: AC
  Filled 2017-06-11: qty 2

## 2017-06-11 NOTE — Progress Notes (Signed)
[facility] [place] [facility]   PATIENT NAME: Manuel Rosales    MR#:  [id number]  DATE OF BIRTH:  12/17/1965  SUBJECTIVE:  Came in after having increasing shortness of breath chest pain and poor appetite for few days Breathing much improved today. REVIEW OF SYSTEMS:   Review of Systems  Constitutional: Negative for chills, fever and weight loss.  HENT: Negative for ear discharge, ear pain and nosebleeds.   Eyes: Negative for blurred vision, pain and discharge.  Respiratory: Positive for shortness of breath. Negative for sputum production, wheezing and stridor.   Cardiovascular: Positive for chest pain and PND. Negative for palpitations and orthopnea.  Gastrointestinal: Negative for abdominal pain, diarrhea, nausea and vomiting.  Genitourinary: Negative for frequency and urgency.  Musculoskeletal: Negative for back pain and joint pain.  Neurological: Negative for sensory change, speech change, focal weakness and weakness.  Psychiatric/Behavioral: Negative for depression and hallucinations. The patient is not nervous/anxious.    Tolerating Diet:yes Tolerating PT: not needed  DRUG ALLERGIES:  No Known Allergies  VITALS:  Blood pressure (!) 134/106, pulse 91, temperature 98.4 F (36.9 C), temperature source Oral, resp. rate 18, height 6\' 1"  (1.854 m), weight 67 kg (147 lb 9.6 oz), SpO2 99 %.  PHYSICAL EXAMINATION:   Physical Exam  GENERAL:  51 y.o.-year-old patient lying in the bed with no acute distress.  EYES: Pupils equal, round, reactive to light and accommodation. No scleral icterus. Extraocular muscles intact.  HEENT: Head atraumatic, normocephalic. Oropharynx and nasopharynx clear.  NECK:  Supple, no jugular venous distention. No thyroid enlargement, no tenderness.  LUNGS: Normal breath sounds bilaterally, no wheezing, rales, rhonchi. No use of accessory muscles of respiration.  CARDIOVASCULAR: S1, S2 normal. No murmurs, rubs, or gallops.   ABDOMEN: Soft, nontender, mild distended. Bowel sounds present. No organomegaly or mass.  EXTREMITIES: No cyanosis, clubbing or edema b/l.    NEUROLOGIC: Cranial nerves II through XII are intact. No focal Motor or sensory deficits b/l.   PSYCHIATRIC:  patient is alert and oriented x 3.  SKIN: No obvious rash, lesion, or ulcer.   LABORATORY PANEL:  CBC Recent Labs  Lab 06/11/17 0438  WBC 5.1  HGB 13.3  HCT 40.8  PLT 347    Chemistries  Recent Labs  Lab 06/09/17 0513 06/10/17 0333 06/11/17 0438  NA 138 136  --   K 4.2 4.3  --   CL 104 100*  --   CO2 26 27  --   GLUCOSE 116* 109*  --   BUN 23* 18  --   CREATININE 1.45* 1.73*  --   CALCIUM 8.0* 8.3*  --   MG  --   --  2.0  AST 44*  --   --   ALT 27  --   --   ALKPHOS 65  --   --   BILITOT 1.1  --   --    Cardiac Enzymes Recent Labs  Lab 06/09/17 1857  TROPONINI 1.59*   RADIOLOGY:  No results found. ASSESSMENT AND PLAN:  Manuel Rosales is a 51 y.o. male has a past medical history significant for HIV and HTN off meds now with 1 week hx of progressive SOB and DOE. Sleeping in a chair. Not eating well as he gets "full" after a few bites. In ER, CXR and BNP suggest CHF. BP and troponin elevated  1. NSTEMI: -Currently chest pain free.  Patient seen by cardiology recommends IV heparin drip for 48 hours.  D/c today -Patient currently is not septic.  Right psoas fluid collection could be an abscess however patient is not septic white count is normal and no fever--- IT to drain the collection today. Spoke with IR nurse -Continue to cycle troponin until peak -EKG with changes consistent with ischemia  -Echo shows severe cardiomyopathy EF of 20-25%. -ASA -Add Lipitor 40 mg daily -pt will get further cardiac w/u as out pt  2. Acute CHF,systolic -Systolic presumed ICM given troponin elevation -Renal function stable with diuresis - recieved IV Lasix with KCl repletion ---change to po lasix -Output more than 4 L over 24  hours  3. R right Psoas muscle fluid collection versus abscess: -Seen by Dr. Jeral PinchFitzgerald--recommends IR guided drain today -surgery Dr. Alphonzo Grieveooper---signed off -Patient on empiric antibiotics--- discontinued Vanco and Zosyn after discussing with ID. -he remains afebrile with normal WBC  4. HIV: -Per ID -[facility] to [facility] for [facility] HIV medications  5. AKI: -Monitor renal function with diuresis   6. HTN: -Improved -Continue current medications     Case discussed with Care Management/Social Worker. Management plans discussed with the patient, family and they are in agreement.  CODE STATUS: Full  DVT Prophylaxis: Heparin  TOTAL TIME TAKING CARE OF THIS PATIENT: 30 minutes.  >50% time spent on counselling and coordination of care   Note: This dictation was prepared with Dragon dictation along with smaller phrase technology. Any transcriptional errors that result from this process are unintentional.  [name] M.D on 06/11/2017 at 11:45 AM  Between 7am to 6pm - Pager - [phone]  After 6pm go to [email or web address] - password [email or web address]  [facility]  Office  [phone]  CC: Primary care physician; Patient, No Pcp Per

## 2017-06-11 NOTE — Procedures (Signed)
10 F drain placed under CT guidance  Sample sent to lab  Complications:  None  Blood Loss: none  See dictation in canopy pacs

## 2017-06-11 NOTE — Care Management (Signed)
R right Psoas muscle fluid collection drained by IR.  Troponins trending down.  Anticipating cardiac cath.  Patient has an appointment scheduled at [facility]. Patient does not have access to scales and CM will provide a set.

## 2017-06-11 NOTE — Progress Notes (Signed)
ANTICOAGULATION CONSULT NOTE  Pharmacy Consult for Heparin Indication: chest pain/ACS  No Known Allergies  Patient Measurements: Height: 6\' 1"  (185.4 cm) Weight: 147 lb 9.6 oz (67 kg) IBW/kg (Calculated) : 79.9 Heparin Dosing Weight: 71 kg  Vital Signs: Temp: 98.1 F (36.7 C) (11/07 0313) Temp Source: Oral (11/07 0313) BP: 127/92 (11/07 0313) Pulse Rate: 91 (11/07 0313)  Labs: Recent Labs    06/08/17 1119  06/08/17 2307 06/09/17 0513 06/09/17 1222  06/09/17 1857 06/10/17 0333 06/10/17 1039 06/10/17 1713 06/11/17 0438  HGB 13.3  --   --  13.5  --   --   --  13.8  --   --  13.3  HCT 41.6  --   --  41.3  --   --   --  42.1  --   --  40.8  PLT 356  --   --  327  --   --   --  338  --   --  347  APTT  --   --   --   --  33  --   --   --   --   --   --   LABPROT  --   --   --   --  15.3*  --   --   --   --   --   --   INR  --   --   --   --  1.22  --   --   --   --   --   --   HEPARINUNFRC  --   --   --   --   --    < > 0.22* 0.21* 0.36 0.40 0.47  CREATININE 1.48*  --   --  1.45*  --   --   --  1.73*  --   --   --   TROPONINI  --    < > 1.66* 1.87*  --   --  1.59*  --   --   --   --    < > = values in this interval not displayed.    Estimated Creatinine Clearance: 46.8 mL/min (A) (by C-G formula based on SCr of 1.73 mg/dL (H)).   Medical History: Past Medical History:  Diagnosis Date  . Dermatitis   . HIV (human immunodeficiency virus infection) (HCC)   . Hypertension     Assessment: 51 y/o M with a h/o HIV and no known h/o cardiac history admitted with SOB and elevated troponin .  Goal of Therapy:  Heparin level 0.3-0.7 units/ml Monitor platelets by anticoagulation protocol: Yes   Plan:  Continue heparin infusion at current rate and recheck a HL in the AM. Per cardiology, plan is to continue heparin x a total of 48 hours which should be around 1200 11/7.  11/7 @ 0430 HL 0.47 therapeutic. Will continue current rate and will recheck HL/CBC w/ am  labs.  Thomasene Rippleavid Nusayba Cadenas, PharmD, BCPS Clinical Pharmacist 06/11/2017

## 2017-06-11 NOTE — Progress Notes (Signed)
Progress Note  Patient Name: Manuel Rosales Date of Encounter: 06/11/2017  Primary Cardiologist: New to [facility] - consult by Merrily Tegeler  Subjective    No chest pain "Feels ok", feet itchy ABD fullness still  No BMP today -360 last 24 hours  . Echo showed EF 20-25%, diffuse HK, Gr1DD, mildly dilated RV with normal systolic function, mildly dilated RA, mild to moderate TR, PASP 40 mmHg.  Inpatient Medications    Scheduled Meds: . aspirin EC  81 mg Oral Daily  . atorvastatin  40 mg Oral q1800  . carvedilol  6.25 mg Oral BID WC  . docusate sodium  100 mg Oral BID  . feeding supplement (ENSURE ENLIVE)  237 mL Oral BID BM  . hydrocerin   Topical BID  . multivitamin with minerals  1 tablet Oral Daily  . nystatin  5 mL Oral QID   Continuous Infusions:  PRN Meds: acetaminophen **OR** acetaminophen, bisacodyl, diazepam, diphenhydrAMINE, hydrALAZINE, ipratropium-albuterol, nitroGLYCERIN, ondansetron **OR** ondansetron (ZOFRAN) IV   Vital Signs    Vitals:   06/10/17 1736 06/10/17 2044 06/11/17 0313 06/11/17 0849  BP: (!) 131/97 (!) 136/91 (!) 127/92 (!) 134/106  Pulse: 97 (!) 104 91 91  Resp: (!) 24   18  Temp: 98.5 F (36.9 C) 98.4 F (36.9 C) 98.1 F (36.7 C) 98.4 F (36.9 C)  TempSrc: Oral Oral Oral Oral  SpO2: 99% 100% 96% 99%  Weight:   147 lb 9.6 oz (67 kg)   Height:        Intake/Output Summary (Last 24 hours) at 06/11/2017 1114 Last data filed at 06/10/2017 1206 Gross per 24 hour  Intake -  Output 275 ml  Net -275 ml   Filed Weights   06/09/17 0458 06/10/17 0403 06/11/17 0313  Weight: 156 lb 3.2 oz (70.9 kg) 148 lb 12.8 oz (67.5 kg) 147 lb 9.6 oz (67 kg)    Telemetry    NSR with TWI - Personally Reviewed  ECG    n/a - Personally Reviewed  Physical Exam   GEN: No acute distress.   Neck: Mildly elevated JVD. Cardiac: RRR, no murmurs, rubs, or gallops.  Respiratory: Faint bibasilar crackles, left > right.  GI: Soft, nontender, non-distended.   MS:  No edema; No deformity. Neuro:  Alert and oriented x 3; Nonfocal.  Psych: Normal affect.  Labs    Chemistry Recent Labs  Lab 06/08/17 1119 06/09/17 0513 06/10/17 0333  NA 139 138 136  K 4.2 4.2 4.3  CL 104 104 100*  CO2 26 26 27   GLUCOSE 117* 116* 109*  BUN 26* 23* 18  CREATININE 1.48* 1.45* 1.73*  CALCIUM 8.5* 8.0* 8.3*  PROT 8.1 7.4  --   ALBUMIN 2.7* 2.5*  --   AST 50* 44*  --   ALT 30 27  --   ALKPHOS 77 65  --   BILITOT 0.9 1.1  --   GFRNONAA 52* 54* 43*  GFRAA >60 >60 50*  ANIONGAP 9 8 9      Hematology Recent Labs  Lab 06/09/17 0513 06/10/17 0333 06/11/17 0438  WBC 4.9 5.7 5.1  RBC 4.74 4.82 4.67  HGB 13.5 13.8 13.3  HCT 41.3 42.1 40.8  MCV 87.1 87.4 87.4  MCH 28.4 28.6 28.5  MCHC 32.6 32.7 32.6  RDW 17.7* 17.5* 17.8*  PLT 327 338 347    Cardiac Enzymes Recent Labs  Lab 06/08/17 1733 06/08/17 2307 06/09/17 0513 06/09/17 1857  TROPONINI 1.68* 1.66* 1.87* 1.59*  No results for input(s): TROPIPOC in the last 168 hours.   BNP Recent Labs  Lab 06/08/17 1127  BNP 4,154.0*     DDimer No results for input(s): DDIMER in the last 168 hours.   Radiology    Dg Chest 2 View  Result Date: 06/08/2017 IMPRESSION: Mild CHF. Electronically Signed   By: Jolaine ClickArthur  Hoss M.D.   On: 06/08/2017 12:23   Ct Abdomen Pelvis W Contrast  Result Date: 06/08/2017 IMPRESSION: 1. Heterogeneous lobulated fluid collection involving the right psoas and iliacus muscles, measuring 11.2 cm in greatest dimension, consistent with an abscess. 2. No other acute abnormalities within the abdomen or pelvis. 3. Trace right pleural effusion. 4. Mild cardiomegaly. 5. Mild aortic atherosclerosis. Electronically Signed   By: Amie Portlandavid  Ormond M.D.   On: 06/08/2017 16:27    Cardiac Studies   TTE 06/09/2017: Study Conclusions  - Left ventricle: The cavity size was normal. There was mild   concentric hypertrophy. Systolic function was severely reduced.   The estimated ejection fraction  was in the range of 20% to 25%.   Diffuse hypokinesis. Doppler parameters are consistent with   abnormal left ventricular relaxation (grade 1 diastolic   dysfunction). - Right ventricle: The cavity size was mildly dilated. Wall   thickness was normal. Systolic function was normal. - Right atrium: The atrium was mildly dilated. - Tricuspid valve: There was mild-moderate regurgitation. - Pulmonary arteries: Systolic pressure was mildly elevated. PA   peak pressure: 40 mm Hg (S). - Pericardium, extracardiac: A trivial pericardial effusion was   identified.  Patient Profile     51 y.o. male with history of HIV, SBO, and HTN who is being seen today for the evaluation of elevated troponin at the request of Dr. Judithann SheenSparks.   Assessment & Plan    1. NSTEMI: -Troponin peaked at 1.97, now down trending -Heparin gtt for a total of 48 hours -Echo showed severe LV dysfunction with diffuse HK  ischemic evaluation with LHC,  On hold secondary to acute renal failure,  RLE abscess  -ASA, heparin  Coreg, Lipitor   2. 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 RLE abscess: -Medical management for now per IM On bbroad ABX  4. HIV: -Per ID/IM  5. AKI/possible cardiorenal syndrome: -Lasix and losartan on hold   Total encounter time more than 25 minutes  Greater than 50% was spent in counseling and coordination of care with the patient   For questions or updates, please contact [facility] Please consult [email or web address] for contact info under Cardiology/STEMI.   Signed, Dossie Arbourim Taeshaun Rames, MD, Ph.D [facility]

## 2017-06-12 ENCOUNTER — Other Ambulatory Visit: Payer: Self-pay | Admitting: Infectious Diseases

## 2017-06-12 DIAGNOSIS — K6812 Psoas muscle abscess: Secondary | ICD-10-CM

## 2017-06-12 LAB — PH, BODY FLUID: pH, Body Fluid: 6.3

## 2017-06-12 LAB — BASIC METABOLIC PANEL
ANION GAP: 8 (ref 5–15)
BUN: 28 mg/dL — ABNORMAL HIGH (ref 6–20)
CALCIUM: 8.5 mg/dL — AB (ref 8.9–10.3)
CO2: 29 mmol/L (ref 22–32)
Chloride: 98 mmol/L — ABNORMAL LOW (ref 101–111)
Creatinine, Ser: 1.41 mg/dL — ABNORMAL HIGH (ref 0.61–1.24)
GFR calc Af Amer: >60 mL/min (ref >60)
GFR, EST NON AFRICAN AMERICAN: 55 mL/min — AB (ref >60)
GLUCOSE: 115 mg/dL — AB (ref 65–99)
Potassium: 4.4 mmol/L (ref 3.5–5.1)
SODIUM: 135 mmol/L (ref 135–145)

## 2017-06-12 LAB — CBC
HCT: 42.1 % (ref 40.0–52.0)
Hemoglobin: 13.5 g/dL (ref 13.0–18.0)
MCH: 28.4 pg (ref 26.0–34.0)
MCHC: 32.1 g/dL (ref 32.0–36.0)
MCV: 88.4 fL (ref 80.0–100.0)
PLATELETS: 338 10*3/uL (ref 150–440)
RBC: 4.76 MIL/uL (ref 4.40–5.90)
RDW: 17.5 % — AB (ref 11.5–14.5)
WBC: 3.5 10*3/uL — AB (ref 3.8–10.6)

## 2017-06-12 LAB — GLUCOSE, CAPILLARY: GLUCOSE-CAPILLARY: 115 mg/dL — AB (ref 65–99)

## 2017-06-12 MED ORDER — ADULT MULTIVITAMIN W/MINERALS CH: 1.0000 | ORAL_TABLET | Freq: Every day | ORAL | 0 refills | Status: DC

## 2017-06-12 MED ORDER — AMOXICILLIN-POT CLAVULANATE 875-125 MG PO TABS: 1.0000 | ORAL_TABLET | Freq: Two times a day (BID) | ORAL | 0 refills | Status: DC

## 2017-06-12 MED ORDER — NITROGLYCERIN 0.4 MG SL SUBL: 0.4000 mg | SUBLINGUAL_TABLET | SUBLINGUAL | 1 refills | Status: DC | PRN

## 2017-06-12 MED ORDER — DOXYCYCLINE HYCLATE 100 MG PO TABS
100.0000 mg | ORAL_TABLET | Freq: Two times a day (BID) | ORAL | Status: DC
  Administered 2017-06-12: 100 mg via ORAL
  Filled 2017-06-12: qty 1

## 2017-06-12 MED ORDER — ATORVASTATIN CALCIUM 40 MG PO TABS: 40.0000 mg | ORAL_TABLET | Freq: Every day | ORAL | 1 refills | Status: DC

## 2017-06-12 MED ORDER — ASPIRIN 81 MG PO TBEC: 81.0000 mg | DELAYED_RELEASE_TABLET | Freq: Every day | ORAL | 1 refills | Status: DC

## 2017-06-12 MED ORDER — CARVEDILOL 6.25 MG PO TABS: 6.2500 mg | ORAL_TABLET | Freq: Two times a day (BID) | ORAL | 1 refills | Status: DC

## 2017-06-12 MED ORDER — ENSURE ENLIVE PO LIQD: 237.0000 mL | Freq: Two times a day (BID) | ORAL | 12 refills | Status: DC

## 2017-06-12 MED ORDER — AMOXICILLIN-POT CLAVULANATE 875-125 MG PO TABS
1.0000 | ORAL_TABLET | Freq: Two times a day (BID) | ORAL | Status: DC
  Administered 2017-06-12: 1 via ORAL
  Filled 2017-06-12: qty 1

## 2017-06-12 MED ORDER — DOXYCYCLINE HYCLATE 100 MG PO TABS: 100.0000 mg | ORAL_TABLET | Freq: Two times a day (BID) | ORAL | 0 refills | Status: DC

## 2017-06-12 NOTE — Discharge Summary (Signed)
[facility] [place] at [facility]   PATIENT NAME: Manuel Rosales    MR#:  [id number]  DATE OF BIRTH:  22-Jan-1966  DATE OF ADMISSION:  06/08/2017 ADMITTING PHYSICIAN: Marguarite ArbourJeffrey D Sparks, MD  DATE OF DISCHARGE: 06/12/2017  PRIMARY CARE PHYSICIAN: Center, [facility]    ADMISSION DIAGNOSIS:  Nausea [R11.0] Generalized abdominal pain [R10.84] NSTEMI (non-ST elevated myocardial infarction) (HCC) [I21.4] Congestive heart failure, unspecified HF chronicity, unspecified heart failure type (HCC) [I50.9]  DISCHARGE DIAGNOSIS:  Acute NST EMI--- ischemic heart workup as outpatient CHF acute systolic Chronic kidney disease stage III  Right psoas muscle fluid collection suspected abscess status post drain CT-guided placed by interventional radiology on 06/11/2017----wound culture results pending History of HIV--- currently not on any medication SECONDARY DIAGNOSIS:   Past Medical History:  Diagnosis Date  . Dermatitis   . HIV (human immunodeficiency virus infection) (HCC)   . Hypertension     HOSPITAL COURSE:  Manuel Krasdward J Burtonis a 51 y.o.malehas a past medical history significant forHIV and HTN off meds now with 1 week hx of progressive SOB and DOE. Sleeping in a chair. Not eating well as he gets "full" after a few bites. In ER, CXR and BNP suggest CHF. BP and troponin elevated  1.NSTEMI: -Currently chest pain free.  Patient seen by cardiology recommends IV heparin drip for 48 hours.  D/c today -Patient currently is not septic.  Right psoas fluid collection could be an abscess however patient is not septic white count is normal and no fever--- IT to drain the collection today. Spoke with IR nurse -Continue to cycle troponin until peak -EKG with changes consistent with ischemia  -Echo shows severe cardiomyopathy EF of 20-25%. -ASA,  Lipitor 40 mg daily -pt will get further cardiac w/u as out pt  2. Acute CHF,systolic -Systolic presumed ICM  given troponin elevation -Renal function stable with diuresis - recieved IV Lasix with KCl repletion---currently is euvolemic. -Output more than 4 L over 24 hours -ACE inhibitors not started secondary to mild elevated creatinine.--- For this to cardiology as outpatient  3. R right Psoas muscle fluid collection versus abscess: -Seen by Dr. Jeral PinchFitzgerald--recommends IR guided drain.  Status post CT-guided drain placed on 06/11/2017.  Fluid culture results still pending.  Patient will [facility] with [facility] Sampson [facility]Fitzgerald [facility] November [facility] -surgery Dr. Alphonzo Grieveooper---signed off -Patient started on oral Augmentin and doxycycline per ID recommendation -he remains afebrile with normal WBC -Patient will also follow[place] in a [place]  4. HIV: -Per ID -Patient to [facility] for his HIV medications  5. AKI: -Monitor renal function with diuresis   6. HTN: -Improved -Continue current medications   Above was discussed with patient's girlfriend was present in the room.  She voiced understanding. She has been set up at CHF clinic as well  CONSULTS OBTAINED:  Treatment Team:  Antonieta IbaGollan, Timothy J, MD Mick SellFitzgerald, David P, MD Pasty Spillersahiliani, Varnita B, MD  DRUG ALLERGIES:  No Known Allergies  DISCHARGE MEDICATIONS:   Current Discharge Medication List    START taking these medications   Details  amoxicillin-clavulanate (AUGMENTIN) 875-125 MG tablet Take 1 tablet every 12 (twelve) hours by mouth. Qty: 20 tablet, Refills: 0    aspirin EC 81 MG EC tablet Take 1 tablet (81 mg total) daily by mouth. Qty: 30 tablet, Refills: 1    atorvastatin (LIPITOR) 40 MG tablet Take 1 tablet (40 mg total) daily at 6  PM by mouth. Qty: 30 tablet, Refills: 1    carvedilol (COREG) 6.25 MG tablet Take 1 tablet (6.25 mg total) 2 (two) times daily with a meal by mouth. Qty: 60 tablet, Refills: 1    doxycycline (VIBRA-TABS) 100 MG  tablet Take 1 tablet (100 mg total) every 12 (twelve) hours by mouth. Qty: 20 tablet, Refills: 0    feeding supplement, ENSURE ENLIVE, (ENSURE ENLIVE) LIQD Take 237 mLs 2 (two) times daily between meals by mouth. Qty: 237 mL, Refills: 12    Multiple Vitamin (MULTIVITAMIN WITH MINERALS) TABS tablet Take 1 tablet daily by mouth. Qty: 30 tablet, Refills: 0    nitroGLYCERIN (NITROSTAT) 0.4 MG SL tablet Place 1 tablet (0.4 mg total) every 5 (five) minutes as needed under the tongue for chest pain. Qty: 30 tablet, Refills: 1      STOP taking these medications     atenolol (TENORMIN) 25 MG tablet      diazepam (VALIUM) 5 MG tablet      hydrochlorothiazide (HYDRODIURIL) 25 MG tablet      naproxen (NAPROSYN) 500 MG tablet         If you experience worsening of your admission symptoms, develop shortness of breath, life threatening emergency, suicidal or homicidal thoughts you must seek medical attention immediately by calling 911 or calling your MD immediately  if symptoms less severe.  You Must read complete instructions/literature along with all the possible adverse reactions/side effects for all the Medicines you take and that have been prescribed to you. Take any new Medicines after you have completely understood and accept all the possible adverse reactions/side effects.   Please note  You were cared for by a hospitalist during your hospital stay. If you have any questions about your discharge medications or the care you received while you were in the hospital after you are discharged, you can call the unit and asked to speak with the hospitalist on call if the hospitalist that took care of you is not available. Once you are discharged, your primary care physician will handle any further medical issues. Please note that NO REFILLS for any discharge medications will be authorized once you are discharged, as it is imperative that you return to your primary care physician (or establish a  relationship with a primary care physician if you do not have one) for your aftercare needs so that they can reassess your need for medications and monitor your lab values. Today   SUBJECTIVE   No complaint  VITAL SIGNS:  Blood pressure (!) 139/99, pulse 88, temperature 98 F (36.7 C), resp. rate 17, height 6\' 1"  (1.854 m), weight 68.2 kg (150 lb 6.4 oz), SpO2 100 %.  I/O:    Intake/Output Summary (Last 24 hours) at 06/12/2017 0837 Last data filed at 06/12/2017 0500 Gross per 24 hour  Intake 278.67 ml  Output 1130 ml  Net -851.33 ml    PHYSICAL EXAMINATION:  GENERAL:  51 y.o.-year-old patient lying in the bed with no acute distress.  EYES: Pupils equal, round, reactive to light and accommodation. No scleral icterus. Extraocular muscles intact.  HEENT: Head atraumatic, normocephalic. Oropharynx and nasopharynx clear.  NECK:  Supple, no jugular venous distention. No thyroid enlargement, no tenderness.  LUNGS: Normal breath sounds bilaterally, no wheezing, rales,rhonchi or crepitation. No use of accessory muscles of respiration.  CARDIOVASCULAR: S1, S2 normal. No murmurs, rubs, or gallops.  ABDOMEN: Soft, non-tender, non-distended. Bowel sounds present. No organomegaly or mass.  Right psoas muscle drain present EXTREMITIES:  No pedal edema, cyanosis, or clubbing.  NEUROLOGIC: Cranial nerves II through XII are intact. Muscle strength 5/5 in all extremities. Sensation intact. Gait not checked.  PSYCHIATRIC: The patient is alert and oriented x 3.  SKIN: No obvious rash, lesion, or ulcer.   DATA REVIEW:   CBC  Recent Labs  Lab 06/12/17 0453  WBC 3.5*  HGB 13.5  HCT 42.1  PLT 338    Chemistries  Recent Labs  Lab 06/09/17 0513  06/11/17 0438 06/12/17 0453  NA 138   < >  --  135  K 4.2   < >  --  4.4  CL 104   < >  --  98*  CO2 26   < >  --  29  GLUCOSE 116*   < >  --  115*  BUN 23*   < >  --  28*  CREATININE 1.45*   < >  --  1.41*  CALCIUM 8.0*   < >  --  8.5*  MG  --    --  2.0  --   AST 44*  --   --   --   ALT 27  --   --   --   ALKPHOS 65  --   --   --   BILITOT 1.1  --   --   --    < > = values in this interval not displayed.    Microbiology Results   No results found for this or any previous visit (from the past 240 hour(s)).  RADIOLOGY:  Ct Guided Needle Placement  Result Date: 06/11/2017 INDICATION: Left iliopsoas abscess EXAM: CT GUIDED DRAINAGE OF ILIOPSOAS ABSCESS MEDICATIONS: The patient is currently admitted to the hospital and receiving intravenous antibiotics. The antibiotics were administered within an appropriate time frame prior to the initiation of the procedure. ANESTHESIA/SEDATION: None COMPLICATIONS: None immediate. TECHNIQUE: Informed written consent was obtained from the patient after a thorough discussion of the procedural risks, benefits and alternatives. All questions were addressed. Maximal Sterile Barrier Technique was utilized including caps, mask, sterile gowns, sterile gloves, sterile drape, hand hygiene and skin antiseptic. A timeout was performed prior to the initiation of the procedure. PROCEDURE: The operative field was prepped with Chlorhexidine in a sterile fashion, and a sterile drape was applied covering the operative field. A sterile gown and sterile gloves were used for the procedure. Local anesthesia was provided with 1% Lidocaine. Utilizing CT fluoroscopic guidance and a crack stick technique a 10 French pigtail catheter was placed percutaneously into the inferior aspect of the multiloculated collection in the right iliacus muscle. The loop was coiled within and approximately 20 mL of a grossly purulent fluid was withdrawn and sent for laboratory evaluation. The catheter was sutured into place and dressed in the standard sterile manner. The patient tolerated the procedure well and was returned his room in satisfactory condition. FINDINGS: The catheter was well seated in the inferior aspect of the multiloculated collection.  Considerable purulent material was noted within. IMPRESSION: Successful placement of a 10 French pigtail drainage catheter in a known right iliopsoas muscle abscess. Followup scanning can be performed as clinically indicated. Electronically Signed   By: Alcide CleverMark  Lukens M.D.   On: 06/11/2017 16:18     Management plans discussed with the patient, family and they are in agreement.  CODE STATUS:     Code Status Orders  (From admission, onward)        Start     Ordered  06/08/17 1720  Full code  Continuous     06/08/17 1719    Code Status History    Date Active Date Inactive Code Status Order ID Comments User Context   01/04/2017 08:01 01/06/2017 16:18 Full Code [id number]  Ricarda Frame, MD Inpatient      TOTAL TIME TAKING CARE OF THIS PATIENT: *40* minutes.    Dorleen Kissel M.D on 06/12/2017 at 8:37 AM  Between 7am to 6pm - Pager - [phone] After 6pm go to [email or web address] - Social research officer, government  [facility] [employer] [facility]  Office  [phone]  CC: Primary care physician; Center, [facility]

## 2017-06-12 NOTE — Progress Notes (Signed)
Rounded on patient to discuss [facility] HF Clinic and Cardiac Rehab.  Patient admitted with dx of NSTEMI, Acute CHF, HIV, AKI, HTN. Cardiology unable to complete ischemic workup due to AKI.  Patient to have ischemic work-up as an outpatient.  Echo performed this admission which revealed EF of 20 - 25%.  Patient being discharge today on the following cardiac medications:  ASA, Lipitor 40 mg, Coreg, NTG.  Ace inhibitors not started due to elevated creatinine.   HF education completed by Malena PeerMelissa Maccia, RPH. The role of the [facility] HF [facility] explained to patient.  Patient has an appointment in the Heart Failure Clinic on 06/18/2017.  Heart Failure Clinic Brochure with appointment time given to patient and significant other.    Patient to follow-up with Dr. Mariah MillingGollan in 10 days.  Significant other, Marcelino DusterMichelle, verbalized understanding to call and make this appointment.    Cardiac Rehab education provided. Overview of program provided. Patient and significant other expressed interest in participating in the program.   I explained to patient we will wait to see if Dr. Mariah MillingGollan wants to perform an outpatient cardiac cath / further ischemic work up prior to starting Cardiac Rehab.  Patient and significant other agreed with plan.  Cardiac Rehab brochure given to patient.    Army Meliaiane Wright, RN, BSN, [facility] Cardiovascular and Pulmonary Nurse Navigator

## 2017-06-12 NOTE — Care Management (Signed)
Primary nurse relays that patient's caregiver has demonstrated management of drain.  No home health needs per health team.  He has been [facility] with [facility].

## 2017-06-12 NOTE — Consult Note (Signed)
Provided patient with "Living Better with Heart Failure" packet. Briefly reviewed definition of heart failure and signs and symptoms of an exacerbation. Reviewed importance of and reason behind checking weight daily in the AM, after using the bathroom, but before getting dressed. Discussed when to call the Dr= weight gain of >2lb overnight of 5lb in a week,  Discussed yellow zone= call MD: weight gain of >2lb overnight of 5lb in a week, increased swelling, increased SOB when lying down, chest discomfort, dizziness, increased fatigue Red Zone= call 911: struggle to breath, fainting or near fainting, significant chest pain Reviewed low sodium diet <2g/day-provided handout of recommended and not recommended foods  Fluid restriction <2L/day Reviewed how to read nutrition label Reviewed medication changes: carvedilol-lasix on hold due to renal function Explained briefly why pt is on the medications (either make you feel better, live longer or keep you out of the hospital) and discussed monitoring and side effects carvedilol  Spoke with pt and his girlfriend. Answered many questions about low sodium diet and timing of medications. recommended using a pill box and setting timer on phone.  Olene FlossMelissa D Quint Chestnut, Pharm.D, BCPS Clinical Pharmacist

## 2017-06-12 NOTE — Progress Notes (Signed)
A&O. Up with assist. JP drain in place with purulent drainage.  No complaints through the night. SO at side.

## 2017-06-12 NOTE — Progress Notes (Signed)
Pt d/c to home today.  IV removed intact.  D/c paperwork printed and reviewed w/pt.  All medication questions and concerns reviewed and pt states understanding.  Prescription sent to pharmacy. Pt and girlfriend educated on drain care.

## 2017-06-12 NOTE — Progress Notes (Signed)
Per Microbiology lab [phone]), they already have enough fluid to run new orders for culture, gram stain, and cell count with diff. Patient can be discharged per Dr. Allena KatzPatel. Needs to follow up at drain clinic in a week for possible removal.

## 2017-06-13 ENCOUNTER — Telehealth: Payer: Self-pay | Admitting: *Deleted

## 2017-06-13 LAB — PROTEIN, BODY FLUID (OTHER)

## 2017-06-13 NOTE — Telephone Encounter (Signed)
-----   Message from Coralee RudSabrina F Gilley sent at 06/13/2017  3:29 PM EST ----- Regarding: tcm/ph 11/27 3:40 p,m. Dr Kirke CorinArida

## 2017-06-16 LAB — HIV GENOSURE(R) MG

## 2017-06-16 LAB — HIV-1 RNA ULTRAQUANT REFLEX TO GENTYP+
HIV-1 RNA BY PCR: 899000 {copies}/mL
HIV-1 RNA QUANT, LOG: 5.954 {Log_copies}/mL

## 2017-06-16 LAB — HLA B*5701: HLA B 5701: NEGATIVE

## 2017-06-16 LAB — REFLEX TO GENOSURE(R) MG: HIV GenoSure(R): 1

## 2017-06-16 NOTE — Telephone Encounter (Signed)
Called home number and person that answered gave me cell number [phone] to call. Called that number and there was no answer and no voicemail available.

## 2017-06-17 LAB — AEROBIC/ANAEROBIC CULTURE W GRAM STAIN (SURGICAL/DEEP WOUND)

## 2017-06-17 LAB — AEROBIC/ANAEROBIC CULTURE (SURGICAL/DEEP WOUND)

## 2017-06-17 NOTE — Telephone Encounter (Signed)
Patient contacted regarding discharge from [facility] on 06/12/17.  Patient understands to follow up with provider Dr. Kirke CorinArida on 07/01/17 at 3:40 PM [facility]. Patient understands discharge instructions? Yes  Patient understands medications and regiment? Yes Patient understands to bring all medications to this visit? Yes  Patient gave me verbal permission to talk with girlfriend on the phone. She verbalized understanding of all instructions with no further questions at this time. She also confirmed appointment for tomorrow with the HF clinic.

## 2017-06-17 NOTE — Telephone Encounter (Signed)
Left detailed voicemail message that I was calling to confirm appointment 07/01/17 with Dr. Kirke CorinArida at 3:40 PM with instructions to call back if he has any questions.

## 2017-06-18 ENCOUNTER — Other Ambulatory Visit: Payer: Self-pay

## 2017-06-18 ENCOUNTER — Ambulatory Visit: Payer: Medicaid Other | Attending: Family | Admitting: Family

## 2017-06-18 ENCOUNTER — Encounter: Payer: Self-pay | Admitting: Family

## 2017-06-18 VITALS — BP 140/96 | HR 93 | Resp 18 | Ht 73.0 in | Wt 152.1 lb

## 2017-06-18 DIAGNOSIS — B20 Human immunodeficiency virus [HIV] disease: Secondary | ICD-10-CM | POA: Diagnosis not present

## 2017-06-18 DIAGNOSIS — I5022 Chronic systolic (congestive) heart failure: Secondary | ICD-10-CM | POA: Insufficient documentation

## 2017-06-18 DIAGNOSIS — I110 Hypertensive heart disease with heart failure: Secondary | ICD-10-CM | POA: Diagnosis present

## 2017-06-18 DIAGNOSIS — I10 Essential (primary) hypertension: Secondary | ICD-10-CM | POA: Insufficient documentation

## 2017-06-18 DIAGNOSIS — F17210 Nicotine dependence, cigarettes, uncomplicated: Secondary | ICD-10-CM | POA: Diagnosis not present

## 2017-06-18 DIAGNOSIS — K6812 Psoas muscle abscess: Secondary | ICD-10-CM | POA: Insufficient documentation

## 2017-06-18 MED ORDER — SACUBITRIL-VALSARTAN 24-26 MG PO TABS: 1.0000 | ORAL_TABLET | Freq: Two times a day (BID) | ORAL | 3 refills | Status: DC

## 2017-06-18 NOTE — Patient Instructions (Addendum)
Continue weighing daily and call for an overnight weight gain of > 2 pounds or a weekly weight gain of >5 pounds.  Start taking Entresto 24/26 mg twice daily.

## 2017-06-18 NOTE — Progress Notes (Signed)
Patient ID: Manuel Rosales, male    DOB: July 06, 1966, 51 y.o.   MRN: [id number]  HPI  Manuel Rosales is a 51 y/o male with a history of HTN, HIV, psoas abscess, recent tobacco/alcohol use and chronic heart failure.   Echo report from 06/09/17 reviewed and shows an EF of 20-25% along with mild/mod TR and mildly elevated PA pressure of 40 mm Hg.   Admitted 06/08/17 due to NSTEMI and HF. Cardiology consult was obtained. Initially needed heparin drip and IV furosemide to start with. Transitioned to oral diuretics with a net loss of ~4L of fluid. Discharged home after 4 days.   He presents today for his initial visit with a chief complaint of moderate fatigue upon minimal exertion. He describes this as worsening over the last few months although is improving over the last few days. Does get quite tired when having to walk up the 18 stairs to his girlfriend's apartment and he has to stop and rest about 1/2 way up. Has associated shortness of breath and difficulty sleeping along with this. He denies any chest pain, edema, palpitations, dizziness or weight gain.   Past Medical History:  Diagnosis Date  . CHF (congestive heart failure) (HCC)   . Dermatitis   . HIV (human immunodeficiency virus infection) (HCC)   . Hypertension    Past Surgical History:  Procedure Laterality Date  . APPENDECTOMY     Family History  Problem Relation Age of Onset  . Cerebral aneurysm Brother        died  . Hypertension Mother   . Diabetes Mother   . Heart attack Father    Social History   Tobacco Use  . Smoking status: Former Smoker    Packs/day: 0.50    Types: Cigarettes    Last attempt to quit: 06/11/2017    Years since quitting: 0.0  . Smokeless tobacco: Never Used  Substance Use Topics  . Alcohol use: No    Frequency: Never   No Known Allergies Prior to Admission medications   Medication Sig Start Date End Date Taking? Authorizing Provider  amoxicillin-clavulanate (AUGMENTIN) 875-125 MG tablet Take 1  tablet every 12 (twelve) hours by mouth. 06/12/17  Yes Enedina FinnerPatel, Sona, MD  aspirin EC 81 MG EC tablet Take 1 tablet (81 mg total) daily by mouth. 06/12/17  Yes Enedina FinnerPatel, Sona, MD  atorvastatin (LIPITOR) 40 MG tablet Take 1 tablet (40 mg total) daily at 6 PM by mouth. 06/12/17  Yes Enedina FinnerPatel, Sona, MD  carvedilol (COREG) 6.25 MG tablet Take 1 tablet (6.25 mg total) 2 (two) times daily with a meal by mouth. 06/12/17  Yes Enedina FinnerPatel, Sona, MD  doxycycline (VIBRA-TABS) 100 MG tablet Take 1 tablet (100 mg total) every 12 (twelve) hours by mouth. 06/12/17  Yes Enedina FinnerPatel, Sona, MD  nitroGLYCERIN (NITROSTAT) 0.4 MG SL tablet Place 1 tablet (0.4 mg total) every 5 (five) minutes as needed under the tongue for chest pain. 06/12/17  Yes Enedina FinnerPatel, Sona, MD    Review of Systems  Constitutional: Positive for fatigue. Negative for appetite change.  HENT: Negative for congestion, postnasal drip and sore throat.   Eyes: Negative.   Respiratory: Positive for shortness of breath. Negative for chest tightness.   Cardiovascular: Negative for chest pain, palpitations and leg swelling.  Gastrointestinal: Negative for abdominal distention and abdominal pain.  Endocrine: Negative.   Genitourinary: Negative.   Musculoskeletal: Negative for back pain and neck pain.  Skin: Negative.   Allergic/Immunologic: Negative.   Neurological: Negative for  dizziness and light-headedness.  Hematological: Negative for adenopathy. Does not bruise/bleed easily.  Psychiatric/Behavioral: Positive for sleep disturbance (sleeping on 2 pillows). Negative for dysphoric mood. The patient is not nervous/anxious.    Vitals:   06/18/17 0958 06/18/17 1030  BP: (!) 143/121 (!) 140/96  Pulse: 93   Resp: 18   SpO2: 99%   Weight: 152 lb 2 oz (69 kg)   Height: 6\' 1"  (1.854 m)    Wt Readings from Last 3 Encounters:  06/18/17 152 lb 2 oz (69 kg)  06/12/17 150 lb 6.4 oz (68.2 kg)  01/10/17 170 lb (77.1 kg)   Lab Results  Component Value Date   CREATININE 1.41 (H)  06/12/2017   CREATININE 1.73 (H) 06/10/2017   CREATININE 1.45 (H) 06/09/2017   Physical Exam  Constitutional: He is oriented to person, place, and time. He appears well-developed and well-nourished.  HENT:  Head: Normocephalic and atraumatic.  Neck: Normal range of motion. Neck supple. No JVD present.  Cardiovascular: Normal rate and regular rhythm.  Pulmonary/Chest: Effort normal. He has no wheezes. He has no rales.  Abdominal: Soft. He exhibits no distension. There is no tenderness.  Musculoskeletal: He exhibits no edema or tenderness.  Neurological: He is alert and oriented to person, place, and time.  Skin: Skin is warm and dry.  Psychiatric: He has a normal mood and affect. His behavior is normal. Thought content normal.  Nursing note and vitals reviewed.  Assessment & Plan:  1: Chronic heart failure with reduced ejection fraction- - NYHA class III - euvolemic today - already weighing daily. Instructed to call for an overnight weight gain of >2 pounds or a weekly weight gain of >5 pounds - not adding salt and is using Mrs Sharilyn SitesDash for seasoning. Discussed the importance of closely following a 2000mg  sodium diet and written dietary information was given to them about this - sees cardiologist Manuel Corin(Arida) 07/01/17 - will add entresto 24/26mg  twice daily. 30 day coupon given to patient and 14 samples given (Lot [id number]/ EXP 04/2018) - has not had any alcohol or tobacco since he was admitted to the hospital - plan on checking labs at his next visit - PharmD went in and reviewed medications with the patient & girlfriend - note given for girlfriend about her getting an apartment on the first floor so patient doesn't have to climb up all the stairs  2: HTN- - BP improving upon recheck; starting entresto per above - BMP from 06/12/17 reviewed and showed sodium 135, potassium 4.4 and GFR >60 - [facility]  3: Psoas abscess- - currently has a drain in place and is  finishing up his antibiotics - sees ID Manuel Goon(Fitzgerald) later today  Visit was done in the presence of patient's girlfriend Manuel Hy(Michelle Cuellar) with patient's approval.   Medication bottles were reviewed.  Return in 1 month or sooner for any questions/problems before then.

## 2017-06-19 ENCOUNTER — Ambulatory Visit
Admission: RE | Admit: 2017-06-19 | Discharge: 2017-06-19 | Disposition: A | Discharge status: Home / Self Care | Payer: Medicaid Other | Source: Ambulatory Visit | Attending: Infectious Diseases | Admitting: Infectious Diseases

## 2017-06-19 DIAGNOSIS — K6812 Psoas muscle abscess: Secondary | ICD-10-CM

## 2017-06-19 HISTORY — PX: IR RADIOLOGIST EVAL & MGMT: IMG5224

## 2017-06-19 MED ORDER — IOPAMIDOL (ISOVUE-300) INJECTION 61%: 100.0000 mL | Freq: Once | INTRAVENOUS | Status: DC | PRN

## 2017-06-19 NOTE — Progress Notes (Signed)
Referring Physician(s): Fitzgerald,David P  Chief Complaint: The patient is seen in follow up today s/p Left iliopsoas abscess 06/11/17: Successful placement of a 10 French pigtail drainage catheter in a known right iliopsoas muscle abscess   History of present illness:  Doing well Denies pain; fever; chills  OP of drain is minimal STAPHYLOCOCCUS AUREUS  Takes Augmentin BID (should finish 06/22/17)  CT today shows resolution of abscess per Dr Fredia SorrowYamagata  Past Medical History:  Diagnosis Date  . CHF (congestive heart failure) (HCC)   . Dermatitis   . HIV (human immunodeficiency virus infection) (HCC)   . Hypertension     Past Surgical History:  Procedure Laterality Date  . APPENDECTOMY      Allergies: Patient has no known allergies.  Medications: Prior to Admission medications   Medication Sig Start Date End Date Taking? Authorizing Provider  amoxicillin-clavulanate (AUGMENTIN) 875-125 MG tablet Take 1 tablet every 12 (twelve) hours by mouth. 06/12/17   Enedina FinnerPatel, Sona, MD  aspirin EC 81 MG EC tablet Take 1 tablet (81 mg total) daily by mouth. 06/12/17   Enedina FinnerPatel, Sona, MD  atorvastatin (LIPITOR) 40 MG tablet Take 1 tablet (40 mg total) daily at 6 PM by mouth. 06/12/17   Enedina FinnerPatel, Sona, MD  carvedilol (COREG) 6.25 MG tablet Take 1 tablet (6.25 mg total) 2 (two) times daily with a meal by mouth. 06/12/17   Enedina FinnerPatel, Sona, MD  doxycycline (VIBRA-TABS) 100 MG tablet Take 1 tablet (100 mg total) every 12 (twelve) hours by mouth. 06/12/17   Enedina FinnerPatel, Sona, MD  nitroGLYCERIN (NITROSTAT) 0.4 MG SL tablet Place 1 tablet (0.4 mg total) every 5 (five) minutes as needed under the tongue for chest pain. 06/12/17   Enedina FinnerPatel, Sona, MD  sacubitril-valsartan [facility](ENTRESTO) 24-26 MG Take 1 tablet 2 (two) times daily by mouth. 06/18/17   Delma FreezeHackney, Tina A, FNP     Family History  Problem Relation Age of Onset  . Cerebral aneurysm Brother        died  . Hypertension Mother   . Diabetes Mother   . Heart attack  Father     Social History   Socioeconomic History  . Marital status: Single    Spouse name: Not on file  . Number of children: 2  . Years of education: 6612  . Highest education level: 12th grade  Social Needs  . Financial resource strain: Not hard at all  . Food insecurity - worry: Sometimes true  . Food insecurity - inability: Sometimes true  . Transportation needs - medical: No  . Transportation needs - non-medical: No  Occupational History  . Not on file  Tobacco Use  . Smoking status: Former Smoker    Packs/day: 0.50    Types: Cigarettes    Last attempt to quit: 06/11/2017    Years since quitting: 0.0  . Smokeless tobacco: Never Used  Substance and Sexual Activity  . Alcohol use: No    Frequency: Never  . Drug use: Yes    Types: Marijuana    Comment: Quit a while ago  . Sexual activity: No    Birth control/protection: None  Other Topics Concern  . Not on file  Social History Narrative  . Not on file     Vital Signs: BP (!) 132/95   Pulse 88   Temp 98.3 F (36.8 C) (Oral)   Resp 16   Ht 6\' 1"  (1.854 m)   Wt 150 lb (68 kg)   SpO2 93%   BMI 19.79  kg/m   Physical Exam  Constitutional: He is oriented to person, place, and time.  Abdominal: Soft. Bowel sounds are normal. There is no tenderness.  Musculoskeletal: Normal range of motion.  Neurological: He is alert and oriented to person, place, and time.  Skin: Skin is warm and dry.  Drain removal without complication. Dressing applied  Psychiatric: He has a normal mood and affect. His behavior is normal.  Nursing note and vitals reviewed.   Imaging: No results found.  Labs:  CBC: Recent Labs    06/10/17 0333 06/11/17 0438 06/12/17 0453  WBC 5.1  5.7 5.1 3.5*  HGB 13.8  13.2 13.3 13.5  HCT 42.1  41.1 40.8 42.1  PLT 338  399* 347 338    COAGS: Recent Labs    06/09/17 1222  INR 1.22  APTT 33    BMP: Recent Labs    06/08/17 1119 06/09/17 0513 06/10/17 0333 06/12/17 0453  NA  139 138 136 135  K 4.2 4.2 4.3 4.4  CL 104 104 100* 98*  CO2 26 26 27 29   GLUCOSE 117* 116* 109* 115*  BUN 26* 23* 18 28*  CALCIUM 8.5* 8.0* 8.3* 8.5*  CREATININE 1.48* 1.45* 1.73* 1.41*  GFRNONAA 52* 54* 43* 55*  GFRAA >60 >60 50* >60    LIVER FUNCTION TESTS: Recent Labs    01/04/17 0201 01/05/17 0406 06/08/17 1119 06/09/17 0513  BILITOT 0.6 0.7 0.9 1.1  AST 36 24 50* 44*  ALT 12* 10* 30 27  ALKPHOS 73 52 77 65  PROT 8.3* 6.8 8.1 7.4  ALBUMIN 3.4* 2.6* 2.7* 2.5*    Assessment:  Iliopsoas abscess  Resolved Drain removal today Follow with Dr Jean Rosenthal Fitzgerald  Signed: Ralene MuskratURPIN,Marcille Barman A, PA-C 06/19/2017, 1:46 PM   Please refer to Dr. Fredia SorrowYamagata attestation of this note for management and plan.

## 2017-06-24 ENCOUNTER — Telehealth: Payer: Self-pay

## 2017-06-24 NOTE — Telephone Encounter (Signed)
Patient called with questions regarding sodium intake.   I reviewed the heart failure diet at length with the patient. He was advised that he should keep his sodium intake to at or below 2,000 mg daily. I advised the patient that we can again go over this diet at his upcoming visit on 07/18/2017. Patient was appreciative of information and verbalized understanding.   We will provide low sodium cookbook and review diet again at next visit.

## 2017-06-25 ENCOUNTER — Encounter: Payer: Self-pay | Admitting: Interventional Radiology

## 2017-06-29 ENCOUNTER — Encounter: Payer: Self-pay | Admitting: Emergency Medicine

## 2017-06-29 ENCOUNTER — Emergency Department: Payer: Medicaid Other

## 2017-06-29 ENCOUNTER — Emergency Department
Admission: EM | Admit: 2017-06-29 | Discharge: 2017-06-29 | Disposition: A | Discharge status: Home / Self Care | Payer: Medicaid Other | Attending: Emergency Medicine | Admitting: Emergency Medicine

## 2017-06-29 ENCOUNTER — Other Ambulatory Visit: Payer: Self-pay

## 2017-06-29 DIAGNOSIS — B20 Human immunodeficiency virus [HIV] disease: Secondary | ICD-10-CM | POA: Diagnosis not present

## 2017-06-29 DIAGNOSIS — R0602 Shortness of breath: Secondary | ICD-10-CM | POA: Diagnosis present

## 2017-06-29 DIAGNOSIS — Z7982 Long term (current) use of aspirin: Secondary | ICD-10-CM | POA: Diagnosis not present

## 2017-06-29 DIAGNOSIS — Z87891 Personal history of nicotine dependence: Secondary | ICD-10-CM | POA: Diagnosis not present

## 2017-06-29 DIAGNOSIS — Z79899 Other long term (current) drug therapy: Secondary | ICD-10-CM | POA: Diagnosis not present

## 2017-06-29 DIAGNOSIS — Z8673 Personal history of transient ischemic attack (TIA), and cerebral infarction without residual deficits: Secondary | ICD-10-CM | POA: Diagnosis not present

## 2017-06-29 DIAGNOSIS — I110 Hypertensive heart disease with heart failure: Secondary | ICD-10-CM | POA: Diagnosis not present

## 2017-06-29 DIAGNOSIS — I509 Heart failure, unspecified: Secondary | ICD-10-CM | POA: Insufficient documentation

## 2017-06-29 DIAGNOSIS — R0600 Dyspnea, unspecified: Secondary | ICD-10-CM | POA: Diagnosis not present

## 2017-06-29 DIAGNOSIS — J189 Pneumonia, unspecified organism: Secondary | ICD-10-CM | POA: Diagnosis not present

## 2017-06-29 LAB — CBC WITH DIFFERENTIAL/PLATELET
BASOS ABS: 0 10*3/uL (ref 0–0.1)
Basophils Relative: 0 %
EOS PCT: 9 %
Eosinophils Absolute: 0.2 10*3/uL (ref 0–0.7)
HCT: 39.2 % — ABNORMAL LOW (ref 40.0–52.0)
HEMOGLOBIN: 12.9 g/dL — AB (ref 13.0–18.0)
LYMPHS ABS: 0.5 10*3/uL — AB (ref 1.0–3.6)
LYMPHS PCT: 22 %
MCH: 28.4 pg (ref 26.0–34.0)
MCHC: 32.9 g/dL (ref 32.0–36.0)
MCV: 86.4 fL (ref 80.0–100.0)
Monocytes Absolute: 0.2 10*3/uL (ref 0.2–1.0)
Monocytes Relative: 9 %
NEUTROS ABS: 1.4 10*3/uL (ref 1.4–6.5)
NEUTROS PCT: 60 %
Platelets: 188 10*3/uL (ref 150–440)
RBC: 4.54 MIL/uL (ref 4.40–5.90)
RDW: 16.4 % — ABNORMAL HIGH (ref 11.5–14.5)
WBC: 2.4 10*3/uL — AB (ref 3.8–10.6)

## 2017-06-29 LAB — COMPREHENSIVE METABOLIC PANEL
ALT: 23 U/L (ref 17–63)
ANION GAP: 9 (ref 5–15)
AST: 31 U/L (ref 15–41)
Albumin: 2.9 g/dL — ABNORMAL LOW (ref 3.5–5.0)
Alkaline Phosphatase: 86 U/L (ref 38–126)
BUN: 27 mg/dL — ABNORMAL HIGH (ref 6–20)
CALCIUM: 8.4 mg/dL — AB (ref 8.9–10.3)
CHLORIDE: 102 mmol/L (ref 101–111)
CO2: 24 mmol/L (ref 22–32)
Creatinine, Ser: 1.06 mg/dL (ref 0.61–1.24)
GFR calc non Af Amer: >60 mL/min (ref >60)
GLUCOSE: 93 mg/dL (ref 65–99)
POTASSIUM: 3.9 mmol/L (ref 3.5–5.1)
SODIUM: 135 mmol/L (ref 135–145)
Total Bilirubin: 0.7 mg/dL (ref 0.3–1.2)
Total Protein: 8.3 g/dL — ABNORMAL HIGH (ref 6.5–8.1)

## 2017-06-29 LAB — PROTIME-INR
INR: 1.07
Prothrombin Time: 13.8 seconds (ref 11.4–15.2)

## 2017-06-29 LAB — TROPONIN I: TROPONIN I: 0.04 ng/mL — AB (ref <0.03)

## 2017-06-29 MED ORDER — AMOXICILLIN 500 MG PO TABS: 1000.0000 mg | ORAL_TABLET | Freq: Two times a day (BID) | ORAL | 0 refills | Status: DC

## 2017-06-29 MED ORDER — AZITHROMYCIN 250 MG PO TABS: ORAL_TABLET | ORAL | 0 refills | Status: DC

## 2017-06-29 MED ORDER — AZITHROMYCIN 500 MG PO TABS
500.0000 mg | ORAL_TABLET | Freq: Once | ORAL | Status: AC
  Administered 2017-06-29: 500 mg via ORAL
  Filled 2017-06-29: qty 1

## 2017-06-29 MED ORDER — AMOXICILLIN 500 MG PO CAPS
1000.0000 mg | ORAL_CAPSULE | Freq: Once | ORAL | Status: AC
  Administered 2017-06-29: 1000 mg via ORAL
  Filled 2017-06-29: qty 2

## 2017-06-29 MED ORDER — IOPAMIDOL (ISOVUE-370) INJECTION 76%
75.0000 mL | Freq: Once | INTRAVENOUS | Status: AC | PRN
  Administered 2017-06-29: 75 mL via INTRAVENOUS

## 2017-06-29 NOTE — ED Triage Notes (Signed)
[facility] Patient has recently had a surgical procedure to drain a large abd cyst that had previously restricted diaphragmatic movement

## 2017-06-29 NOTE — ED Notes (Signed)
Lower lobes diminished, mid lobes fine crackles

## 2017-06-29 NOTE — ED Notes (Signed)
Patient transported to CT 

## 2017-06-29 NOTE — Discharge Instructions (Signed)
Please take all of your antibiotics as prescribed and follow-up with your primary care physician in 2 days for recheck.  Please return to the emergency department immediately for any new or worsening symptoms such as fevers, chills, worsening shortness of breath, or for any other issues whatsoever.  It was a pleasure to take care of you today, and thank you for coming to our emergency department.  If you have any questions or concerns before leaving please ask the nurse to grab me and I'm more than happy to go through your aftercare instructions again.  If you were prescribed any opioid pain medication today such as Norco, Vicodin, Percocet, morphine, hydrocodone, or oxycodone please make sure you do not drive when you are taking this medication as it can alter your ability to drive safely.  If you have any concerns once you are home that you are not improving or are in fact getting worse before you can make it to your follow-up appointment, please do not hesitate to call 911 and come back for further evaluation.  Merrily Brittle, MD  Results for orders placed or performed during the hospital encounter of 06/29/17  Comprehensive metabolic panel  Result Value Ref Range   Sodium 135 135 - 145 mmol/L   Potassium 3.9 3.5 - 5.1 mmol/L   Chloride 102 101 - 111 mmol/L   CO2 24 22 - 32 mmol/L   Glucose, Bld 93 65 - 99 mg/dL   BUN 27 (H) 6 - 20 mg/dL   Creatinine, Ser 1.61 0.61 - 1.24 mg/dL   Calcium 8.4 (L) 8.9 - 10.3 mg/dL   Total Protein 8.3 (H) 6.5 - 8.1 g/dL   Albumin 2.9 (L) 3.5 - 5.0 g/dL   AST 31 15 - 41 U/L   ALT 23 17 - 63 U/L   Alkaline Phosphatase 86 38 - 126 U/L   Total Bilirubin 0.7 0.3 - 1.2 mg/dL   GFR calc non Af Amer >60 >60 mL/min   GFR calc Af Amer >60 >60 mL/min   Anion gap 9 5 - 15  Protime-INR  Result Value Ref Range   Prothrombin Time 13.8 11.4 - 15.2 seconds   INR 1.07   CBC with Differential  Result Value Ref Range   WBC 2.4 (L) 3.8 - 10.6 K/uL   RBC 4.54 4.40 -  5.90 MIL/uL   Hemoglobin 12.9 (L) 13.0 - 18.0 g/dL   HCT 09.6 (L) 04.5 - 40.9 %   MCV 86.4 80.0 - 100.0 fL   MCH 28.4 26.0 - 34.0 pg   MCHC 32.9 32.0 - 36.0 g/dL   RDW 81.1 (H) 91.4 - 78.2 %   Platelets 188 150 - 440 K/uL   Neutrophils Relative % 60 %   Neutro Abs 1.4 1.4 - 6.5 K/uL   Lymphocytes Relative 22 %   Lymphs Abs 0.5 (L) 1.0 - 3.6 K/uL   Monocytes Relative 9 %   Monocytes Absolute 0.2 0.2 - 1.0 K/uL   Eosinophils Relative 9 %   Eosinophils Absolute 0.2 0 - 0.7 K/uL   Basophils Relative 0 %   Basophils Absolute 0.0 0 - 0.1 K/uL  Troponin I  Result Value Ref Range   Troponin I 0.04 (HH) <0.03 ng/mL   Dg Chest 2 View  Result Date: 06/29/2017 CLINICAL DATA:  Shortness of breath for over 1 day. EXAM: CHEST  2 VIEW COMPARISON:  PA and lateral chest 06/08/2017 and 07/19/2013. FINDINGS: There is cardiomegaly without edema. No pneumothorax or pleural effusion.  No focal bony abnormality. IMPRESSION: Cardiomegaly without acute disease. Electronically Signed   By: Drusilla Kannerhomas  Dalessio M.D.   On: 06/29/2017 17:43   Dg Chest 2 View  Result Date: 06/08/2017 CLINICAL DATA:  Short of breath for 3 days EXAM: CHEST  2 VIEW COMPARISON:  07/19/2013 FINDINGS: The heart is moderately enlarged. Vascular congestion. Mild interstitial edema at the lung bases. No pneumothorax or pleural effusion. IMPRESSION: Mild CHF. Electronically Signed   By: Jolaine ClickArthur  Hoss M.D.   On: 06/08/2017 12:23   Ct Angio Chest Pe W/cm &/or Wo Cm  Result Date: 06/29/2017 CLINICAL DATA:  Dyspnea times 30 hours. Recent procedure to drain right iliopsoas abscess. EXAM: CT ANGIOGRAPHY CHEST CT ABDOMEN AND PELVIS WITH CONTRAST TECHNIQUE: Multidetector CT imaging of the chest was performed using the standard protocol during bolus administration of intravenous contrast. Multiplanar CT image reconstructions and MIPs were obtained to evaluate the vascular anatomy. Multidetector CT imaging of the abdomen and pelvis was performed using the  standard protocol during bolus administration of intravenous contrast. CONTRAST:  75mL ISOVUE-370 IOPAMIDOL (ISOVUE-370) INJECTION 76% COMPARISON:  06/19/2017 CT abdomen and pelvis, CXR 06/08/2017 FINDINGS: CTA CHEST FINDINGS Cardiovascular: Cardiomegaly with small pericardial effusion measuring up to 7 mm in thickness, stable in appearance. Preferential opacification of the pulmonary arterial system without evidence of acute pulmonary embolus to the segmental level. No aortic aneurysm. Mediastinum/Nodes: No enlarged mediastinal, hilar, or axillary lymph nodes. Thyroid gland, trachea, and esophagus demonstrate no significant findings. Normal branch pattern of the great vessels. Small fat containing Bochdalek hernia on the right appear Lungs/Pleura: Mild peribronchial thickening to the lower lobes. Patchy bilateral upper lobe airspace opacities with bibasilar dependent atelectasis and trace right greater than left pleural effusions. Streaky atelectasis and/or scarring is seen at the left lung base. Musculoskeletal: Bridging right lateral osteophytes along the upper thoracic spine. No acute osseous abnormality. Review of the MIP images confirms the above findings. CT ABDOMEN and PELVIS FINDINGS Hepatobiliary: No focal liver abnormality is seen. No gallstones, gallbladder wall thickening, or biliary dilatation. Pancreas: Unremarkable. No pancreatic ductal dilatation or surrounding inflammatory changes. Spleen: Normal in size without focal abnormality. Adrenals/Urinary Tract: Adrenal glands are unremarkable. Kidneys are normal, without renal calculi, focal lesion, or hydronephrosis. Bladder is unremarkable. Stomach/Bowel: Stomach is within normal limits. Appendix is surgically absent. No evidence of bowel wall thickening, distention, or inflammatory changes. Vascular/Lymphatic: Atherosclerotic nonaneurysmal abdominal aorta with patent portal, splenic, hepatic and renal veins. Mild stable inguinal lymphadenopathy.  Reproductive: Prostate is unremarkable. Other: No abdominal wall hernia or free air. Trace free fluid in the pelvis. Musculoskeletal: Removal of percutaneous drain along previously noted iliopsoas abscess. Residual right lower quadrant edema and intramuscular edema is noted of the right psoas and iliacus muscles without recurrent significant fluid collections. Review of the MIP images confirms the above findings. IMPRESSION: 1. No acute pulmonary embolus. 2. Bilateral upper lobe airspace opacities suspicious for pneumonia. Follow-up to resolution is recommended after appropriate antibiotic therapy. 3. Stable cardiomegaly with trace bilateral pleural effusions and atelectasis. Mild peribronchial thickening that may reflect chronic bronchitic change predominantly to the lower lobes. 4. Right lower quadrant percutaneous drainage catheter has been removed from the iliopsoas abscess. Residual mild edema in the right hemipelvis as above stated. No recurrence of abscess collection is apparent. 5. Stable inguinal lymphadenopathy, likely reactive. 6. Fat containing right Bochdalek deck hernia is stable. Electronically Signed   By: Tollie Ethavid  Kwon M.D.   On: 06/29/2017 20:10   Ct Guided Needle Placement  Result Date: 06/11/2017  INDICATION: Left iliopsoas abscess EXAM: CT GUIDED DRAINAGE OF ILIOPSOAS ABSCESS MEDICATIONS: The patient is currently admitted to the hospital and receiving intravenous antibiotics. The antibiotics were administered within an appropriate time frame prior to the initiation of the procedure. ANESTHESIA/SEDATION: None COMPLICATIONS: None immediate. TECHNIQUE: Informed written consent was obtained from the patient after a thorough discussion of the procedural risks, benefits and alternatives. All questions were addressed. Maximal Sterile Barrier Technique was utilized including caps, mask, sterile gowns, sterile gloves, sterile drape, hand hygiene and skin antiseptic. A timeout was performed prior to the  initiation of the procedure. PROCEDURE: The operative field was prepped with Chlorhexidine in a sterile fashion, and a sterile drape was applied covering the operative field. A sterile gown and sterile gloves were used for the procedure. Local anesthesia was provided with 1% Lidocaine. Utilizing CT fluoroscopic guidance and a crack stick technique a 10 French pigtail catheter was placed percutaneously into the inferior aspect of the multiloculated collection in the right iliacus muscle. The loop was coiled within and approximately 20 mL of a grossly purulent fluid was withdrawn and sent for laboratory evaluation. The catheter was sutured into place and dressed in the standard sterile manner. The patient tolerated the procedure well and was returned his room in satisfactory condition. FINDINGS: The catheter was well seated in the inferior aspect of the multiloculated collection. Considerable purulent material was noted within. IMPRESSION: Successful placement of a 10 French pigtail drainage catheter in a known right iliopsoas muscle abscess. Followup scanning can be performed as clinically indicated. Electronically Signed   By: Alcide CleverMark  Lukens M.D.   On: 06/11/2017 16:18   Ct Abdomen Pelvis W Contrast  Result Date: 06/29/2017 CLINICAL DATA:  Dyspnea times 30 hours. Recent procedure to drain right iliopsoas abscess. EXAM: CT ANGIOGRAPHY CHEST CT ABDOMEN AND PELVIS WITH CONTRAST TECHNIQUE: Multidetector CT imaging of the chest was performed using the standard protocol during bolus administration of intravenous contrast. Multiplanar CT image reconstructions and MIPs were obtained to evaluate the vascular anatomy. Multidetector CT imaging of the abdomen and pelvis was performed using the standard protocol during bolus administration of intravenous contrast. CONTRAST:  75mL ISOVUE-370 IOPAMIDOL (ISOVUE-370) INJECTION 76% COMPARISON:  06/19/2017 CT abdomen and pelvis, CXR 06/08/2017 FINDINGS: CTA CHEST FINDINGS  Cardiovascular: Cardiomegaly with small pericardial effusion measuring up to 7 mm in thickness, stable in appearance. Preferential opacification of the pulmonary arterial system without evidence of acute pulmonary embolus to the segmental level. No aortic aneurysm. Mediastinum/Nodes: No enlarged mediastinal, hilar, or axillary lymph nodes. Thyroid gland, trachea, and esophagus demonstrate no significant findings. Normal branch pattern of the great vessels. Small fat containing Bochdalek hernia on the right appear Lungs/Pleura: Mild peribronchial thickening to the lower lobes. Patchy bilateral upper lobe airspace opacities with bibasilar dependent atelectasis and trace right greater than left pleural effusions. Streaky atelectasis and/or scarring is seen at the left lung base. Musculoskeletal: Bridging right lateral osteophytes along the upper thoracic spine. No acute osseous abnormality. Review of the MIP images confirms the above findings. CT ABDOMEN and PELVIS FINDINGS Hepatobiliary: No focal liver abnormality is seen. No gallstones, gallbladder wall thickening, or biliary dilatation. Pancreas: Unremarkable. No pancreatic ductal dilatation or surrounding inflammatory changes. Spleen: Normal in size without focal abnormality. Adrenals/Urinary Tract: Adrenal glands are unremarkable. Kidneys are normal, without renal calculi, focal lesion, or hydronephrosis. Bladder is unremarkable. Stomach/Bowel: Stomach is within normal limits. Appendix is surgically absent. No evidence of bowel wall thickening, distention, or inflammatory changes. Vascular/Lymphatic: Atherosclerotic nonaneurysmal abdominal aorta with patent  portal, splenic, hepatic and renal veins. Mild stable inguinal lymphadenopathy. Reproductive: Prostate is unremarkable. Other: No abdominal wall hernia or free air. Trace free fluid in the pelvis. Musculoskeletal: Removal of percutaneous drain along previously noted iliopsoas abscess. Residual right lower  quadrant edema and intramuscular edema is noted of the right psoas and iliacus muscles without recurrent significant fluid collections. Review of the MIP images confirms the above findings. IMPRESSION: 1. No acute pulmonary embolus. 2. Bilateral upper lobe airspace opacities suspicious for pneumonia. Follow-up to resolution is recommended after appropriate antibiotic therapy. 3. Stable cardiomegaly with trace bilateral pleural effusions and atelectasis. Mild peribronchial thickening that may reflect chronic bronchitic change predominantly to the lower lobes. 4. Right lower quadrant percutaneous drainage catheter has been removed from the iliopsoas abscess. Residual mild edema in the right hemipelvis as above stated. No recurrence of abscess collection is apparent. 5. Stable inguinal lymphadenopathy, likely reactive. 6. Fat containing right Bochdalek deck hernia is stable. Electronically Signed   By: Tollie Eth M.D.   On: 06/29/2017 20:10   Ct Abdomen Pelvis W Contrast  Result Date: 06/19/2017 CLINICAL DATA:  Status post percutaneous catheter drainage of right iliopsoas abscess on 06/11/2017. EXAM: CT ABDOMEN AND PELVIS WITH CONTRAST TECHNIQUE: Multidetector CT imaging of the abdomen and pelvis was performed using the standard protocol following bolus administration of intravenous contrast. CONTRAST:  100 mL Isovue-300 IV COMPARISON:  None. FINDINGS: Lower chest: Scarring/atelectasis at the left lung base. Hepatobiliary: No focal liver abnormality is seen. No gallstones, gallbladder wall thickening, or biliary dilatation. Pancreas: Unremarkable. No pancreatic ductal dilatation or surrounding inflammatory changes. Spleen: Normal in size without focal abnormality. Adrenals/Urinary Tract: Adrenal glands are unremarkable. Kidneys are normal, without renal calculi, focal lesion, or hydronephrosis. Bladder is unremarkable. Stomach/Bowel: No evidence of bowel obstruction, ileus or inflammation. No free air.  Vascular/Lymphatic: No significant vascular findings are present. No enlarged abdominal or pelvic lymph nodes. Reproductive: Prostate is unremarkable. Other: Percutaneous drain extends into the right iliopsoas muscle at the level of the iliac fossa. No evidence of residual abscess. No new fluid collections. Musculoskeletal: No acute or significant osseous findings. IMPRESSION: Resolution of right iliopsoas abscess after percutaneous catheter drainage. Electronically Signed   By: Irish Lack M.D.   On: 06/19/2017 14:13   Ct Abdomen Pelvis W Contrast  Result Date: 06/08/2017 CLINICAL DATA:  51 y.o. male patient with a history of HIV as well as hypertension and small bowel obstruction was presented to the emergency department today with 2-3 days of shortness of breath and weakness. The patient is also complaining of diffuse abdominal pain with nausea and inability to tolerate any large amount of food. Patient denies any radiation of his abdominal pain. Denies any chest pain. Patient says that his shortness of breath is worse when he has exerting himself or lying flat EXAM: CT ABDOMEN AND PELVIS WITH CONTRAST TECHNIQUE: Multidetector CT imaging of the abdomen and pelvis was performed using the standard protocol following bolus administration of intravenous contrast. CONTRAST:  ISOVUE-300 IOPAMIDOL (ISOVUE-300) INJECTION 61% COMPARISON:  01/10/2017 FINDINGS: Lower chest: Heart is mildly enlarged. Trace right pleural effusion. Mild dependent subsegmental atelectasis in the right lung base. Fat containing right Bochdalek hernia. Hepatobiliary: No focal liver abnormality is seen. No gallstones, gallbladder wall thickening, or biliary dilatation. Pancreas: Unremarkable. No pancreatic ductal dilatation or surrounding inflammatory changes. Spleen: Normal in size without focal abnormality. Adrenals/Urinary Tract: Adrenal glands are unremarkable. Kidneys are normal, without renal calculi, focal lesion, or  hydronephrosis. Bladder is unremarkable. Stomach/Bowel:  Stomach, small bowel and colon are unremarkable. Appendix has been removed. Vascular/Lymphatic: Mild aortic atherosclerosis. There are prominent and mildly enlarged external iliac and inguinal lymph nodes. Largest left inguinal node is 15 mm in short axis. Largest right inguinal node is also 15 mm in short axis. Reproductive: Unremarkable. Other: There is a heterogeneous fluid collection extending from the lateral soleus muscle through the iliacus muscle into the right anterior pelvis. It measures approximately 11.2 x 5.2 x 6.8 cm. There are mild adjacent inflammatory changes in a trace amount of fluid. Musculoskeletal: No fracture or acute finding. No CT evidence of discitis or SI joint infection. IMPRESSION: 1. Heterogeneous lobulated fluid collection involving the right psoas and iliacus muscles, measuring 11.2 cm in greatest dimension, consistent with an abscess. 2. No other acute abnormalities within the abdomen or pelvis. 3. Trace right pleural effusion. 4. Mild cardiomegaly. 5. Mild aortic atherosclerosis. Electronically Signed   By: Amie Portland M.D.   On: 06/08/2017 16:27   Ir Radiologist Eval & Mgmt  Result Date: 06/25/2017 Please refer to notes tab for details about interventional procedure. (Op Note)

## 2017-06-29 NOTE — ED Provider Notes (Signed)
[facility] Emergency Department Provider Note  ____________________________________________   First MD Initiated Contact with Patient 06/29/17 1740     (approximate)  I have reviewed the triage vital signs and the nursing notes.   HISTORY  Chief Complaint Shortness of Breath   HPI Manuel Rosales is a 51 y.o. male is self presents to the emergency department with roughly 24 hours of progressive shortness of breath.  The shortness of breath is worse with exertion and somewhat improved with rest.  He denies leg swelling.  He denies history of DVT or pulmonary embolism.  He does have a complex recent past medical history including an 11 cm right sided iliopsoas abscess that is status post drainage by interventional radiology.  He feels like his abscess might have recurred because previously it presented with shortness of breath and abdominal pain.  His symptoms are now moderate to severe.  The began insidiously and have been slowly progressive.  He has a past medical history of congestive heart failure as well as HIV that is untreated with an unknown CD4 count.  Additionally he had a recent non-STEMI.  06/09/17 Echo: Study Conclusions  - Left ventricle: The cavity size was normal. There was mild   concentric hypertrophy. Systolic function was severely reduced.   The estimated ejection fraction was in the range of 20% to 25%.   Diffuse hypokinesis. Doppler parameters are consistent with   abnormal left ventricular relaxation (grade 1 diastolic   dysfunction). - Right ventricle: The cavity size was mildly dilated. Wall   thickness was normal. Systolic function was normal. - Right atrium: The atrium was mildly dilated. - Tricuspid valve: There was mild-moderate regurgitation. - Pulmonary arteries: Systolic pressure was mildly elevated. PA   peak pressure: 40 mm Hg (S). - Pericardium, extracardiac: A trivial pericardial effusion was   identified.  Past Medical  History:  Diagnosis Date  . CHF (congestive heart failure) (HCC)   . Dermatitis   . HIV (human immunodeficiency virus infection) (HCC)   . Hypertension     Patient Active Problem List   Diagnosis Date Noted  . HTN (hypertension) 06/18/2017  . NSTEMI (non-ST elevated myocardial infarction) (HCC)   . Psoas abscess (HCC)   . CHF (congestive heart failure) (HCC) 06/08/2017  . Elevated troponin 06/08/2017  . HIV disease (HCC) 06/08/2017  . Small bowel obstruction (HCC) 01/04/2017    Past Surgical History:  Procedure Laterality Date  . APPENDECTOMY    . IR RADIOLOGIST EVAL & MGMT  06/19/2017    Prior to Admission medications   Medication Sig Start Date End Date Taking? Authorizing Provider  aspirin EC 81 MG EC tablet Take 1 tablet (81 mg total) daily by mouth. 06/12/17  Yes Enedina FinnerPatel, Sona, MD  atorvastatin (LIPITOR) 40 MG tablet Take 1 tablet (40 mg total) daily at 6 PM by mouth. 06/12/17  Yes Enedina FinnerPatel, Sona, MD  carvedilol (COREG) 6.25 MG tablet Take 1 tablet (6.25 mg total) 2 (two) times daily with a meal by mouth. 06/12/17  Yes Enedina FinnerPatel, Sona, MD  nitroGLYCERIN (NITROSTAT) 0.4 MG SL tablet Place 1 tablet (0.4 mg total) every 5 (five) minutes as needed under the tongue for chest pain. 06/12/17  Yes Enedina FinnerPatel, Sona, MD  sacubitril-valsartan [facility](ENTRESTO) 24-26 MG Take 1 tablet 2 (two) times daily by mouth. 06/18/17  Yes Clarisa KindredHackney, Tina A, FNP  amoxicillin (AMOXIL) 500 MG tablet Take 2 tablets (1,000 mg total) by mouth 2 (two) times daily for 7 days. 06/29/17 07/06/17  Merrily Brittle, MD  azithromycin (ZITHROMAX Z-PAK) 250 MG tablet Take 2 tablets (500 mg) on  Day 1,  followed by 1 tablet (250 mg) once daily on Days 2 through 5. 06/29/17   Merrily Brittle, MD    Allergies Patient has no known allergies.  Family History  Problem Relation Age of Onset  . Cerebral aneurysm Brother        died  . Hypertension Mother   . Diabetes Mother   . Heart attack Father     Social History Social History    Tobacco Use  . Smoking status: Former Smoker    Packs/day: 0.50    Types: Cigarettes    Last attempt to quit: 06/11/2017    Years since quitting: 0.0  . Smokeless tobacco: Never Used  Substance Use Topics  . Alcohol use: No    Frequency: Never  . Drug use: Yes    Types: Marijuana    Comment: Quit a while ago    Review of Systems Constitutional: No fever/chills Eyes: No visual changes. ENT: No sore throat. Cardiovascular: Positive for chest pain. Respiratory: Positive for shortness of breath. Gastrointestinal: Positive for abdominal pain.  No nausea, no vomiting.  No diarrhea.  No constipation. Genitourinary: Negative for dysuria. Musculoskeletal: Negative for back pain. Skin: Negative for rash. Neurological: Negative for headaches, focal weakness or numbness.   ____________________________________________   PHYSICAL EXAM:  VITAL SIGNS: ED Triage Vitals [06/29/17 1611]  Enc Vitals Group     BP      Pulse      Resp      Temp      Temp src      SpO2      Weight      Height      Head Circumference      Peak Flow      Pain Score 0     Pain Loc      Pain Edu?      Excl. in GC?     Constitutional: Alert and oriented x4 pleasant cooperative slightly increased respiratory rate but no objective respiratory failure Eyes: PERRL EOMI. Head: Atraumatic. Nose: No congestion/rhinnorhea. Mouth/Throat: No trismus Neck: No stridor.  Able to lie completely flat with no jugular venous distention Cardiovascular: Normal rate, regular rhythm. Grossly normal heart sounds.  Good peripheral circulation. Respiratory: Normal respiratory effort.  No retractions. Lungs CTAB and moving good air Gastrointestinal: Soft and nontender abdomen Musculoskeletal: No lower extremity edema   Neurologic:  Normal speech and language. No gross focal neurologic deficits are appreciated. Skin:  Skin is warm, dry and intact. No rash noted. Psychiatric: Mood and affect are normal. Speech and  behavior are normal.    ____________________________________________   DIFFERENTIAL includes but not limited to  Pulmonary embolism, pneumothorax, pneumonia, acute coronary syndrome, congestive heart failure exacerbation ____________________________________________   LABS (all labs ordered are listed, but only abnormal results are displayed)  Labs Reviewed  COMPREHENSIVE METABOLIC PANEL - Abnormal; Notable for the following components:      Result Value   BUN 27 (*)    Calcium 8.4 (*)    Total Protein 8.3 (*)    Albumin 2.9 (*)    All other components within normal limits  CBC WITH DIFFERENTIAL/PLATELET - Abnormal; Notable for the following components:   WBC 2.4 (*)    Hemoglobin 12.9 (*)    HCT 39.2 (*)    RDW 16.4 (*)    Lymphs Abs 0.5 (*)    All other  components within normal limits  TROPONIN I - Abnormal; Notable for the following components:   Troponin I 0.04 (*)    All other components within normal limits  PROTIME-INR    Blood work reviewed by me with low white count which is roughly at his baseline and likely secondary to AIDS __________________________________________  EKG  ED ECG REPORT I, Merrily BrittleNeil Scottie Stanish, the attending physician, personally viewed and interpreted this ECG.  Date: 06/29/2017 EKG Time: 1619 Rate: 85 Rhythm: normal sinus rhythm QRS Axis: normal Intervals: normal ST/T Wave abnormalities:  Deep TWI leads 2, 3, afv, v3-v6 is similar to previous EKG done June 08, 2017 Narrative Interpretation: no evidence of acute ischemia  ____________________________________________  RADIOLOGY  CT pulmonary angiogram and CT abdomen pelvis reviewed by me with no evidence of PE and no intra-abdominal abscess but is concerning for upper lobe pneumonia ____________________________________________   PROCEDURES  Procedure(s) performed: no  Procedures  Critical Care performed: no  Observation:  no ____________________________________________   INITIAL IMPRESSION / ASSESSMENT AND PLAN / ED COURSE  Pertinent labs & imaging results that were available during my care of the patient were reviewed by me and considered in my medical decision making (see chart for details).  On arrival the patient is saturating well although does report some chest discomfort and shortness of breath.  Differentials extremely broad but given his recent hospitalization and interventional radiology procedure I do have to consider pulmonary embolism.  I appreciate the deep T wave inversions inferiorly and laterally although they are roughly similar to his previous.  CT pulmonary angiogram as well as abdomen and pelvis are pending.      __----------------------------------------- 8:44 PM on 06/29/2017 -----------------------------------------  Fortunately the patient's CT scan is negative for pulmonary embolism or recurrent abdominal abscess.  It is suggestive of possible pneumonia.  He does have some shortness of breath and a dry cough although not overtly productive.  Given his HIV status and recent hospitalization I did offer the patient inpatient admission for further workup of his pneumonia, however he declined preferring to go home which I think is reasonable.  I will give him a first dose of azithromycin and amoxicillin now and give him a 2-day recheck.  Strict return precautions have been given.  __________________________________________   FINAL CLINICAL IMPRESSION(S) / ED DIAGNOSES  Final diagnoses:  Dyspnea, unspecified type  HIV (human immunodeficiency virus infection) (HCC)  Community acquired pneumonia, unspecified laterality      NEW MEDICATIONS STARTED DURING THIS VISIT:  This SmartLink is deprecated. Use AVSMEDLIST instead to display the medication list for a patient.   Note:  This document was prepared using Dragon voice recognition software and may include unintentional dictation  errors.     Merrily Brittleifenbark, Keyli Duross, MD 06/29/17 2054

## 2017-07-01 ENCOUNTER — Ambulatory Visit (INDEPENDENT_AMBULATORY_CARE_PROVIDER_SITE_OTHER): Payer: Medicaid Other | Admitting: Cardiovascular Disease

## 2017-07-01 ENCOUNTER — Encounter: Payer: Self-pay | Admitting: Cardiovascular Disease

## 2017-07-01 VITALS — BP 140/110 | HR 78 | Ht 73.0 in | Wt 160.5 lb

## 2017-07-01 DIAGNOSIS — I5022 Chronic systolic (congestive) heart failure: Secondary | ICD-10-CM

## 2017-07-01 DIAGNOSIS — I214 Non-ST elevation (NSTEMI) myocardial infarction: Secondary | ICD-10-CM | POA: Diagnosis not present

## 2017-07-01 DIAGNOSIS — E785 Hyperlipidemia, unspecified: Secondary | ICD-10-CM

## 2017-07-01 DIAGNOSIS — I10 Essential (primary) hypertension: Secondary | ICD-10-CM | POA: Diagnosis not present

## 2017-07-01 MED ORDER — ATORVASTATIN CALCIUM 40 MG PO TABS: 40.0000 mg | ORAL_TABLET | Freq: Every day | ORAL | 5 refills | Status: DC

## 2017-07-01 MED ORDER — CARVEDILOL 12.5 MG PO TABS: 12.5000 mg | ORAL_TABLET | Freq: Two times a day (BID) | ORAL | 3 refills | Status: DC

## 2017-07-01 MED ORDER — FUROSEMIDE 20 MG PO TABS: 20.0000 mg | ORAL_TABLET | Freq: Every day | ORAL | 3 refills | Status: DC

## 2017-07-01 NOTE — Patient Instructions (Signed)
Medication Instructions:  Your physician has recommended you make the following change in your medication:  INCREASE coreg to 12.5mg  twice daily START taking lasix 20mg  once daily   [facility]. No appt needed  Testing/Procedures: none  Follow-Up: Your physician recommends that you schedule a follow-up appointment in: 1 month with Dr. Kirke CorinArida.    Any Other Special Instructions Will Be Listed Below (If Applicable).     If you need a refill on your cardiac medications before your next appointment, please call your pharmacy.

## 2017-07-01 NOTE — Progress Notes (Signed)
Cardiology Office Note   Date:  07/01/2017   ID:  Manuel Rosales, DOB Dec 23, 1965, MRN [id number]  [facility]  Cardiologist:   Lorine BearsMuhammad Arida, MD   Chief Complaint  Patient presents with  . other    F/u CHF c/o sob. Meds reviewed verbally with pt.      History of Present Illness: Manuel Rosales is a 51 y.o. male who presents for a follow-up visit after recent hospitalization for a small non-ST elevation myocardial infarction in the setting of abdominal abscess. The patient has known history of HIV currently not on medications, tobacco use and hypertension.  He was hospitalized earlier this month with shortness of breath, orthopnea and abdominal swelling.  He was hypertensive on presentation.  CT abdomen and pelvis showed a fluid collection involving the right psoas was an ileus muscles.  His troponin was mildly elevated at 1.87.  He had an echocardiogram done which showed an EF of 20-25%, diffuse hypokinesis, mild to moderate tricuspid regurgitation and mild pulmonary hypertension. Cardiac catheterization was not performed due to active infection and acute renal failure.  He underwent CT-guided drainage of the abscess and was treated with antibiotics . He has been doing reasonably well but went to the emergency room recently with worsening shortness.  He reports orthopnea and PND as most of his symptoms seem to be at night when he tries to lie down.  He has no chest pain.  His weight gradually increased from 146-152 pounds. He quit smoking since his hospitalization.  No alcohol use.   Past Medical History:  Diagnosis Date  . CHF (congestive heart failure) (HCC) 06/2017   EF of 20-25%.  . Coronary artery disease 06/2017   Non-ST elevation myocardial infarction.  Treated medically with no cardiac cath done due to active infection and renal failure.  . Dermatitis   . HIV (human immunodeficiency virus infection) (HCC)   . Hypertension     Past  Surgical History:  Procedure Laterality Date  . APPENDECTOMY    . IR RADIOLOGIST EVAL & MGMT  06/19/2017     Current Outpatient Medications  Medication Sig Dispense Refill  . amoxicillin-clavulanate (AUGMENTIN) 875-125 MG tablet Take 1 tablet by mouth every 12 (twelve) hours.  0  . aspirin EC 81 MG EC tablet Take 1 tablet (81 mg total) daily by mouth. 30 tablet 1  . atorvastatin (LIPITOR) 40 MG tablet Take 1 tablet (40 mg total) by mouth daily at 6 PM. 30 tablet 5  . carvedilol (COREG) 12.5 MG tablet Take 1 tablet (12.5 mg total) by mouth 2 (two) times daily with a meal. 180 tablet 3  . doxycycline (VIBRA-TABS) 100 MG tablet Take 100 mg by mouth every 12 (twelve) hours.  0  . nitroGLYCERIN (NITROSTAT) 0.4 MG SL tablet Place 1 tablet (0.4 mg total) every 5 (five) minutes as needed under the tongue for chest pain. 30 tablet 1  . sacubitril-valsartan (ENTRESTO) 24-26 MG Take 1 tablet 2 (two) times daily by mouth. 60 tablet 3  . furosemide (LASIX) 20 MG tablet Take 1 tablet (20 mg total) by mouth daily. 90 tablet 3   No current facility-administered medications for this visit.     Allergies:   Patient has no known allergies.    Social History:  The patient  reports that he quit smoking about 2 weeks ago. His smoking use included cigarettes. He smoked 0.50 packs per day. he has never used smokeless tobacco. He reports that  he uses drugs. Drug: Marijuana. He reports that he does not drink alcohol.   Family History:  The patient's family history includes Cerebral aneurysm in his brother; Diabetes in his mother; Heart attack in his father; Hypertension in his mother.    ROS:  Please see the history of present illness.   Otherwise, review of systems are positive for none.   All other systems are reviewed and negative.    PHYSICAL EXAM: VS:  BP (!) 140/110 (BP Location: Left Arm, Patient Position: Sitting, Cuff Size: Normal)   Pulse 78   Ht 6\' 1"  (1.854 m)   Wt 160 lb 8 oz (72.8 kg)   BMI  21.18 kg/m  , BMI Body mass index is 21.18 kg/m. GEN: Well nourished, well developed, in no acute distress  HEENT: normal  Neck: no JVD, carotid bruits, or masses Cardiac: RRR; no murmurs, rubs, or gallops,no edema  Respiratory:  clear to auscultation bilaterally, normal work of breathing GI: soft, nontender, nondistended, + BS MS: no deformity or atrophy  Skin: warm and dry, no rash Neuro:  Strength and sensation are intact Psych: euthymic mood, full affect   EKG:  EKG is ordered today. The ekg ordered today demonstrates normal sinus rhythm with anterolateral T wave changes suggestive of ischemia.   Recent Labs: 06/08/2017: B Natriuretic Peptide 4,154.0 06/11/2017: Magnesium 2.0 06/29/2017: ALT 23; BUN 27; Creatinine, Ser 1.06; Hemoglobin 12.9; Platelets 188; Potassium 3.9; Sodium 135    Lipid Panel    Component Value Date/Time   CHOL 123 06/09/2017 1222   TRIG 94 06/09/2017 1222   HDL 21 (L) 06/09/2017 1222   CHOLHDL 5.9 06/09/2017 1222   VLDL 19 06/09/2017 1222   LDLCALC 83 06/09/2017 1222      Wt Readings from Last 3 Encounters:  07/01/17 160 lb 8 oz (72.8 kg)  06/29/17 155 lb 4.8 oz (70.4 kg)  06/19/17 150 lb (68 kg)       No flowsheet data found.    ASSESSMENT AND PLAN:  1.  Recent non-ST elevation myocardial infarction: Currently with no anginal symptoms.  Continue treatment with aspirin, atorvastatin and carvedilol.  I am planning to proceed with a right and left cardiac catheterization in the near future once his infections are clear and preferably after he starts treatment for HIV.  2.  Chronic systolic heart failure: Ejection fraction was 20-25%.  We have to exclude ischemic etiology as outlined above.  He appears to be volume overloaded and thus I added furosemide 20 mg once daily.  Check basic metabolic profile in 1 week. I increase carvedilol to 12.5 mg twice daily.  Continue treatment with Entresto and consider increasing the dose if blood pressure  allows and renal function is stable.  3.  Hyperlipidemia: Continue treatment with atorvastatin.  He will require a follow-up lipid and liver profile.    Disposition:   FU with me in 1 month  Signed,  Lorine BearsMuhammad Arida, MD  07/01/2017 5:04 PM    [facility]

## 2017-07-02 LAB — CULTURE, FUNGUS WITHOUT SMEAR

## 2017-07-03 ENCOUNTER — Other Ambulatory Visit
Admission: RE | Admit: 2017-07-03 | Discharge: 2017-07-03 | Disposition: A | Discharge status: Home / Self Care | Payer: Medicaid Other | Source: Ambulatory Visit | Attending: Cardiovascular Disease | Admitting: Cardiovascular Disease

## 2017-07-03 DIAGNOSIS — I10 Essential (primary) hypertension: Secondary | ICD-10-CM | POA: Diagnosis not present

## 2017-07-03 LAB — BASIC METABOLIC PANEL
Anion gap: 11 (ref 5–15)
BUN: 22 mg/dL — ABNORMAL HIGH (ref 6–20)
CHLORIDE: 105 mmol/L (ref 101–111)
CO2: 25 mmol/L (ref 22–32)
CREATININE: 1.19 mg/dL (ref 0.61–1.24)
Calcium: 8.7 mg/dL — ABNORMAL LOW (ref 8.9–10.3)
Glucose, Bld: 92 mg/dL (ref 65–99)
POTASSIUM: 4.3 mmol/L (ref 3.5–5.1)
SODIUM: 141 mmol/L (ref 135–145)

## 2017-07-17 NOTE — Progress Notes (Signed)
Patient ID: Manuel Rosales, male    DOB: Jan 15, 1966, 51 y.o.   MRN: [id number]  HPI  [name] is a 51 y/o male with a history of HTN, HIV, psoas abscess, recent tobacco/alcohol use and chronic heart failure.   Echo report from 06/09/17 reviewed and shows an EF of 20-25% along with mild/mod TR and mildly elevated PA pressure of 40 mm Hg.   Was in the ED 06/29/17 due to shortness of breath. CT indicated pneumonia and he was treated and released. Admitted 06/08/17 due to NSTEMI and HF. Cardiology consult was obtained. Initially needed heparin drip and IV furosemide to start with. Transitioned to oral diuretics with a net loss of ~4L of fluid. Discharged home after 4 days.   He presents today for a follow-up visit with a chief complaint of moderate shortness of breath upon minimal exertion. He describes this as chronic in nature having been present for several years with varying levels of severity. He does feel like his shortness of breath has worsened recently. He has associated fatigue, abdominal distention, weight gain and difficulty sleeping along with this. He denies any chest pain, edema, palpitations or dizziness.   Past Medical History:  Diagnosis Date  . CHF (congestive heart failure) (HCC) 06/2017   EF of 20-25%.  . Coronary artery disease 06/2017   Non-ST elevation myocardial infarction.  Treated medically with no cardiac cath done due to active infection and renal failure.  . Dermatitis   . HIV (human immunodeficiency virus infection) (HCC)   . Hypertension    Past Surgical History:  Procedure Laterality Date  . APPENDECTOMY    . IR RADIOLOGIST EVAL & MGMT  06/19/2017   Family History  Problem Relation Age of Onset  . Cerebral aneurysm Brother        died  . Hypertension Mother   . Diabetes Mother   . Heart attack Father    Social History   Tobacco Use  . Smoking status: Former Smoker    Packs/day: 0.50    Types: Cigarettes    Last attempt to quit: 06/11/2017     Years since quitting: 0.0  . Smokeless tobacco: Never Used  Substance Use Topics  . Alcohol use: No    Frequency: Never   No Known Allergies  Past Medical History:  Diagnosis Date  . CHF (congestive heart failure) (HCC) 06/2017   EF of 20-25%.  . Coronary artery disease 06/2017   Non-ST elevation myocardial infarction.  Treated medically with no cardiac cath done due to active infection and renal failure.  . Dermatitis   . HIV (human immunodeficiency virus infection) (HCC)   . Hypertension    Past Surgical History:  Procedure Laterality Date  . APPENDECTOMY    . IR RADIOLOGIST EVAL & MGMT  06/19/2017   Family History  Problem Relation Age of Onset  . Cerebral aneurysm Brother        died  . Hypertension Mother   . Diabetes Mother   . Heart attack Father    Social History   Tobacco Use  . Smoking status: Former Smoker    Packs/day: 0.50    Types: Cigarettes    Last attempt to quit: 06/11/2017    Years since quitting: 0.1  . Smokeless tobacco: Never Used  Substance Use Topics  . Alcohol use: No    Frequency: Never   No Known Allergies Prior to Admission medications   Medication Sig Start Date End Date Taking? Authorizing Provider  aspirin  EC 81 MG EC tablet Take 1 tablet (81 mg total) daily by mouth. 06/12/17  Yes Enedina Finner, MD  atorvastatin (LIPITOR) 40 MG tablet Take 1 tablet (40 mg total) by mouth daily at 6 PM. 07/01/17  Yes Arida, Chelsea Aus, MD  bictegravir-emtricitabine-tenofovir AF (BIKTARVY) 50-200-25 MG TABS tablet Take 1 tablet by mouth daily.   Yes [provider]  carvedilol (COREG) 12.5 MG tablet Take 1 tablet (12.5 mg total) by mouth 2 (two) times daily with a meal. 07/01/17  Yes Iran Ouch, MD  furosemide (LASIX) 20 MG tablet Take 1 tablet (20 mg total) by mouth daily. 07/01/17 09/29/17 Yes Iran Ouch, MD  nitroGLYCERIN (NITROSTAT) 0.4 MG SL tablet Place 1 tablet (0.4 mg total) every 5 (five) minutes as needed under the tongue  for chest pain. 06/12/17  Yes Enedina Finner, MD  sacubitril-valsartan [facility]) 24-26 MG Take 1 tablet 2 (two) times daily by mouth. 06/18/17  Yes Clarisa Kindred A, FNP  sulfamethoxazole-trimethoprim (BACTRIM DS,SEPTRA DS) 800-160 MG tablet Take 1 tablet by mouth 2 (two) times daily.   Yes [provider]    Review of Systems  Constitutional: Positive for appetite change (decreased) and fatigue.  HENT: Negative for congestion, postnasal drip and sore throat.   Eyes: Negative.   Respiratory: Positive for shortness of breath. Negative for chest tightness.   Cardiovascular: Negative for chest pain, palpitations and leg swelling.  Gastrointestinal: Positive for abdominal distention. Negative for abdominal pain.  Endocrine: Negative.   Genitourinary: Negative.   Musculoskeletal: Negative for back pain and neck pain.  Skin: Negative.   Allergic/Immunologic: Negative.   Neurological: Negative for dizziness and light-headedness.  Hematological: Negative for adenopathy. Does not bruise/bleed easily.  Psychiatric/Behavioral: Positive for sleep disturbance (sleeping on 2 pillows in the recliner). Negative for dysphoric mood. The patient is not nervous/anxious.    Vitals:   07/18/17 0955  BP: (!) 147/114  Pulse: 84  Resp: 18  Weight: 166 lb 6 oz (75.5 kg)  Height: 6\' 1"  (1.854 m)   Wt Readings from Last 3 Encounters:  07/18/17 166 lb 6 oz (75.5 kg)  07/01/17 160 lb 8 oz (72.8 kg)  06/29/17 155 lb 4.8 oz (70.4 kg)    Lab Results  Component Value Date   CREATININE 1.19 07/03/2017   CREATININE 1.06 06/29/2017   CREATININE 1.41 (H) 06/12/2017   Physical Exam  Constitutional: He is oriented to person, place, and time. He appears well-developed and well-nourished.  HENT:  Head: Normocephalic and atraumatic.  Neck: Normal range of motion. Neck supple. JVD present.  Cardiovascular: Normal rate and regular rhythm.  Pulmonary/Chest: Effort normal. He has no wheezes. He has no rales.   Abdominal: He exhibits distension. There is no tenderness.  Musculoskeletal: He exhibits no edema or tenderness.  Neurological: He is alert and oriented to person, place, and time.  Skin: Skin is warm and dry.  Psychiatric: He has a normal mood and affect. His behavior is normal. Thought content normal.  Nursing note and vitals reviewed.  Assessment & Plan:  1: Acute on Chronic heart failure with reduced ejection fraction- - NYHA class III - fluid overloaded today - weighing daily. Instructed to call for an overnight weight gain of >2 pounds or a weekly weight gain of >5 pounds - weight up 14 pounds since he was last here - not adding salt and is using Mrs Sharilyn Sites for seasoning. Discussed the importance of closely following a 2000mg  sodium diet  - saw cardiologist Kirke Corin)  07/01/17 - tolerating entresto without known side effects - has not had any alcohol or tobacco since he was admitted to the hospital - BMP from 07/03/17 reviewed and showed sodium 141, potassium 4.3 and GFR >60  - furosemide added recently with cardiology note about checking labs within a week so will recheck labs today  - will stop his furosemide and begin torsemide 40mg  daily. Will also add metolazone 2.5mg  daily for the next 3 days. Will send in an RX for potassium 20meq daily but he is not to start that until we get his lab results back.  - drinking ~56 ounces of fluid daily  2: HTN- - BP elevated even after recheck with a manual cuff - changing diuretic and adding metolazone per above - [facility]  3: HIV- - sees ID Sampson Goon([place]) & medication has been started  Visit was done in the presence of patient's girlfriend Julian Hy(Michelle Cuellar) with patient's approval.   Medication bottles were reviewed.  Return in 1 week or sooner for any questions/problems before then.

## 2017-07-18 ENCOUNTER — Ambulatory Visit: Payer: Medicaid Other | Attending: Family | Admitting: Family

## 2017-07-18 ENCOUNTER — Telehealth: Payer: Self-pay | Admitting: Family

## 2017-07-18 ENCOUNTER — Encounter: Payer: Self-pay | Admitting: Family

## 2017-07-18 VITALS — BP 147/114 | HR 84 | Resp 18 | Ht 73.0 in | Wt 166.4 lb

## 2017-07-18 DIAGNOSIS — Z8249 Family history of ischemic heart disease and other diseases of the circulatory system: Secondary | ICD-10-CM | POA: Insufficient documentation

## 2017-07-18 DIAGNOSIS — Z7982 Long term (current) use of aspirin: Secondary | ICD-10-CM | POA: Diagnosis not present

## 2017-07-18 DIAGNOSIS — Z79899 Other long term (current) drug therapy: Secondary | ICD-10-CM | POA: Diagnosis not present

## 2017-07-18 DIAGNOSIS — I252 Old myocardial infarction: Secondary | ICD-10-CM | POA: Diagnosis not present

## 2017-07-18 DIAGNOSIS — Z833 Family history of diabetes mellitus: Secondary | ICD-10-CM | POA: Insufficient documentation

## 2017-07-18 DIAGNOSIS — I2510 Atherosclerotic heart disease of native coronary artery without angina pectoris: Secondary | ICD-10-CM | POA: Diagnosis not present

## 2017-07-18 DIAGNOSIS — I110 Hypertensive heart disease with heart failure: Secondary | ICD-10-CM | POA: Diagnosis not present

## 2017-07-18 DIAGNOSIS — Z21 Asymptomatic human immunodeficiency virus [HIV] infection status: Secondary | ICD-10-CM | POA: Diagnosis not present

## 2017-07-18 DIAGNOSIS — B20 Human immunodeficiency virus [HIV] disease: Secondary | ICD-10-CM

## 2017-07-18 DIAGNOSIS — I509 Heart failure, unspecified: Secondary | ICD-10-CM | POA: Insufficient documentation

## 2017-07-18 DIAGNOSIS — Z87891 Personal history of nicotine dependence: Secondary | ICD-10-CM | POA: Insufficient documentation

## 2017-07-18 DIAGNOSIS — I5022 Chronic systolic (congestive) heart failure: Secondary | ICD-10-CM

## 2017-07-18 DIAGNOSIS — I5023 Acute on chronic systolic (congestive) heart failure: Secondary | ICD-10-CM | POA: Diagnosis not present

## 2017-07-18 DIAGNOSIS — I10 Essential (primary) hypertension: Secondary | ICD-10-CM

## 2017-07-18 LAB — BASIC METABOLIC PANEL
Anion gap: 6 (ref 5–15)
BUN: 22 mg/dL — AB (ref 6–20)
CHLORIDE: 105 mmol/L (ref 101–111)
CO2: 26 mmol/L (ref 22–32)
CREATININE: 1.47 mg/dL — AB (ref 0.61–1.24)
Calcium: 8.8 mg/dL — ABNORMAL LOW (ref 8.9–10.3)
GFR calc Af Amer: >60 mL/min (ref >60)
GFR calc non Af Amer: 53 mL/min — ABNORMAL LOW (ref >60)
GLUCOSE: 82 mg/dL (ref 65–99)
POTASSIUM: 4.5 mmol/L (ref 3.5–5.1)
SODIUM: 137 mmol/L (ref 135–145)

## 2017-07-18 MED ORDER — TORSEMIDE 20 MG PO TABS: 40.0000 mg | ORAL_TABLET | Freq: Two times a day (BID) | ORAL | 3 refills | Status: DC

## 2017-07-18 MED ORDER — POTASSIUM CHLORIDE CRYS ER 20 MEQ PO TBCR: 20.0000 meq | EXTENDED_RELEASE_TABLET | Freq: Every day | ORAL | 3 refills | Status: DC

## 2017-07-18 MED ORDER — METOLAZONE 2.5 MG PO TABS: 2.5000 mg | ORAL_TABLET | Freq: Every day | ORAL | 0 refills | Status: DC

## 2017-07-18 NOTE — Patient Instructions (Addendum)
Continue weighing daily and call for an overnight weight gain of > 2 pounds or a weekly weight gain of >5 pounds.  Drink between 40-60 ounces of fluid daily.   Stop furosemide and begin torsemide 20mg  ( 2 tablets daily).   Adding metolazone 2.5mg  daily for the next 3 days. Best to take it 1/2 hour prior to morning fluid pill   Do not start potassium until we get lab results back.

## 2017-07-18 NOTE — Telephone Encounter (Signed)
Spoke with patient's girlfriend, Manuel Rosales, with patient listening in on speaker phone regarding lab results that were obtained today (07/18/17). Advised them that renal function looked good and potassium level was normal.   Metolazone was added today along with changing diuretic to torsemide. They did pick up the potassium prescription to have on hand. Advised them that during the 3 days of taking the metolazone, that he should take 20meq potassium daily for these 3 days as well. Will recheck labs next week.

## 2017-07-23 ENCOUNTER — Other Ambulatory Visit: Payer: Self-pay

## 2017-07-23 ENCOUNTER — Ambulatory Visit: Payer: Medicaid Other | Attending: Family | Admitting: Family

## 2017-07-23 ENCOUNTER — Inpatient Hospital Stay: Admission: RE | Admit: 2017-07-23 | Payer: Medicaid Other | Source: Ambulatory Visit

## 2017-07-23 ENCOUNTER — Encounter: Payer: Self-pay | Admitting: Family

## 2017-07-23 ENCOUNTER — Telehealth: Payer: Self-pay | Admitting: Family

## 2017-07-23 ENCOUNTER — Other Ambulatory Visit: Payer: Self-pay | Admitting: Family

## 2017-07-23 VITALS — BP 92/55 | HR 80 | Resp 18 | Ht 73.0 in | Wt 150.5 lb

## 2017-07-23 DIAGNOSIS — I252 Old myocardial infarction: Secondary | ICD-10-CM | POA: Insufficient documentation

## 2017-07-23 DIAGNOSIS — B20 Human immunodeficiency virus [HIV] disease: Secondary | ICD-10-CM | POA: Insufficient documentation

## 2017-07-23 DIAGNOSIS — Z79899 Other long term (current) drug therapy: Secondary | ICD-10-CM | POA: Insufficient documentation

## 2017-07-23 DIAGNOSIS — Z87891 Personal history of nicotine dependence: Secondary | ICD-10-CM | POA: Diagnosis not present

## 2017-07-23 DIAGNOSIS — I110 Hypertensive heart disease with heart failure: Secondary | ICD-10-CM | POA: Insufficient documentation

## 2017-07-23 DIAGNOSIS — E861 Hypovolemia: Secondary | ICD-10-CM

## 2017-07-23 DIAGNOSIS — R0602 Shortness of breath: Secondary | ICD-10-CM | POA: Diagnosis present

## 2017-07-23 DIAGNOSIS — I2510 Atherosclerotic heart disease of native coronary artery without angina pectoris: Secondary | ICD-10-CM | POA: Insufficient documentation

## 2017-07-23 DIAGNOSIS — I5022 Chronic systolic (congestive) heart failure: Secondary | ICD-10-CM

## 2017-07-23 DIAGNOSIS — Z7982 Long term (current) use of aspirin: Secondary | ICD-10-CM | POA: Diagnosis not present

## 2017-07-23 DIAGNOSIS — I9589 Other hypotension: Secondary | ICD-10-CM

## 2017-07-23 DIAGNOSIS — I959 Hypotension, unspecified: Secondary | ICD-10-CM | POA: Diagnosis not present

## 2017-07-23 LAB — BASIC METABOLIC PANEL
ANION GAP: 10 (ref 5–15)
BUN: 56 mg/dL — ABNORMAL HIGH (ref 6–20)
CALCIUM: 9.4 mg/dL (ref 8.9–10.3)
CO2: 34 mmol/L — ABNORMAL HIGH (ref 22–32)
Chloride: 90 mmol/L — ABNORMAL LOW (ref 101–111)
Creatinine, Ser: 3.58 mg/dL — ABNORMAL HIGH (ref 0.61–1.24)
GFR, EST AFRICAN AMERICAN: 21 mL/min — AB (ref >60)
GFR, EST NON AFRICAN AMERICAN: 18 mL/min — AB (ref >60)
Glucose, Bld: 110 mg/dL — ABNORMAL HIGH (ref 65–99)
Potassium: 4.5 mmol/L (ref 3.5–5.1)
SODIUM: 134 mmol/L — AB (ref 135–145)

## 2017-07-23 MED ORDER — SACUBITRIL-VALSARTAN 24-26 MG PO TABS: 1.0000 | ORAL_TABLET | Freq: Two times a day (BID) | ORAL | 3 refills | Status: DC

## 2017-07-23 NOTE — Patient Instructions (Addendum)
Continue weighing daily and call for an overnight weight gain of > 2 pounds or a weekly weight gain of >5 pounds.  Decrease torsemide to 40mg  in the mornings only

## 2017-07-23 NOTE — Telephone Encounter (Signed)
Spoke with patient and his girlfriend regarding lab work that was drawn today (07/23/17). Patient has recently taken 3 days of metolazone along with a change of diuretic to torsemide 40mg  twice daily.   Advised them that potassium level is normal (4.5) but renal function has declined sharply with the metolazone use (creatinine 3.58/ GFR 21). Torsemide was decreased to 40mg  daily from twice daily at his visit today.  Will recheck labs in 2 days on 07/25/17. Has follow-up appointment with cardiologist on 07/31/17.

## 2017-07-23 NOTE — Progress Notes (Signed)
Patient ID: Manuel Rosales, male    DOB: 05-05-1966, 51 y.o.   MRN: [id number]  HPI  Manuel Rosales is a 51 y/o male with a history of HTN, HIV, psoas abscess, recent tobacco/alcohol use and chronic heart failure.   Echo report from 06/09/17 reviewed and shows an EF of 20-25% along with mild/mod TR and mildly elevated PA pressure of 40 mm Hg.   Was in the ED 06/29/17 due to shortness of breath. CT indicated pneumonia and he was treated and released. Admitted 06/08/17 due to NSTEMI and HF. Cardiology consult was obtained. Initially needed heparin drip and IV furosemide to start with. Transitioned to oral diuretics with a net loss of ~4L of fluid. Discharged home after 4 days.   He presents today for a follow-up visit with a chief complaint of minimal shortness of breath upon moderate exertion. He describes this as chronic in nature having been present for several months. He does feel like his breathing has gotten better with the metolazone and change to torsemide. He has associated fatigue and dizziness with position changes. He denies any chest pain, edema, palpitations, abdominal distention or weight gain. Can now sleep in the bed without having to get up because he's short of breath.   Past Medical History:  Diagnosis Date  . CHF (congestive heart failure) (HCC) 06/2017   EF of 20-25%.  . Coronary artery disease 06/2017   Non-ST elevation myocardial infarction.  Treated medically with no cardiac cath done due to active infection and renal failure.  . Dermatitis   . HIV (human immunodeficiency virus infection) (HCC)   . Hypertension    Past Surgical History:  Procedure Laterality Date  . APPENDECTOMY    . IR RADIOLOGIST EVAL & MGMT  06/19/2017   Family History  Problem Relation Age of Onset  . Cerebral aneurysm Brother        died  . Hypertension Mother   . Diabetes Mother   . Heart attack Father    Social History   Tobacco Use  . Smoking status: Former Smoker    Packs/day: 0.50     Types: Cigarettes    Last attempt to quit: 06/11/2017    Years since quitting: 0.1  . Smokeless tobacco: Never Used  Substance Use Topics  . Alcohol use: No    Frequency: Never   No Known Allergies  Past Medical History:  Diagnosis Date  . CHF (congestive heart failure) (HCC) 06/2017   EF of 20-25%.  . Coronary artery disease 06/2017   Non-ST elevation myocardial infarction.  Treated medically with no cardiac cath done due to active infection and renal failure.  . Dermatitis   . HIV (human immunodeficiency virus infection) (HCC)   . Hypertension    Past Surgical History:  Procedure Laterality Date  . APPENDECTOMY    . IR RADIOLOGIST EVAL & MGMT  06/19/2017   Family History  Problem Relation Age of Onset  . Cerebral aneurysm Brother        died  . Hypertension Mother   . Diabetes Mother   . Heart attack Father    Social History   Tobacco Use  . Smoking status: Former Smoker    Packs/day: 0.50    Types: Cigarettes    Last attempt to quit: 06/11/2017    Years since quitting: 0.1  . Smokeless tobacco: Never Used  Substance Use Topics  . Alcohol use: No    Frequency: Never   No Known Allergies  Prior to  Admission medications   Medication Sig Start Date End Date Taking? Authorizing Provider  aspirin EC 81 MG EC tablet Take 1 tablet (81 mg total) daily by mouth. 06/12/17  Yes Enedina FinnerPatel, Sona, MD  atorvastatin (LIPITOR) 40 MG tablet Take 1 tablet (40 mg total) by mouth daily at 6 PM. 07/01/17  Yes Arida, Chelsea AusMuhammad A, MD  bictegravir-emtricitabine-tenofovir AF (BIKTARVY) 50-200-25 MG TABS tablet Take 1 tablet by mouth daily.   Yes [provider]  carvedilol (COREG) 12.5 MG tablet Take 1 tablet (12.5 mg total) by mouth 2 (two) times daily with a meal. 07/01/17  Yes Iran OuchArida, Muhammad A, MD  nitroGLYCERIN (NITROSTAT) 0.4 MG SL tablet Place 1 tablet (0.4 mg total) every 5 (five) minutes as needed under the tongue for chest pain. 06/12/17  Yes Enedina FinnerPatel, Sona, MD   sacubitril-valsartan (ENTRESTO) 24-26 MG Take 1 tablet by mouth 2 (two) times daily. 07/23/17  Yes Clarisa KindredHackney, Tina A, FNP  sulfamethoxazole-trimethoprim (BACTRIM DS,SEPTRA DS) 800-160 MG tablet Take 1 tablet by mouth 2 (two) times daily.   Yes [provider]  torsemide (DEMADEX) 20 MG tablet Take 2 tablets (40 mg total) by mouth 2 (two) times daily. 07/18/17 10/16/17 Yes Hackney, Inetta Fermoina A, FNP  potassium chloride SA (K-DUR,KLOR-CON) 20 MEQ tablet Take 1 tablet (20 mEq total) by mouth daily. Patient not taking: Reported on 07/23/2017 07/18/17   Delma FreezeHackney, Tina A, FNP    Review of Systems  Constitutional: Positive for fatigue. Negative for appetite change.  HENT: Negative for congestion, postnasal drip and sore throat.   Eyes: Negative.   Respiratory: Positive for shortness of breath (minimal but improved). Negative for chest tightness.   Cardiovascular: Negative for chest pain, palpitations and leg swelling.  Gastrointestinal: Negative for abdominal distention and abdominal pain.  Endocrine: Negative.   Genitourinary: Negative.   Musculoskeletal: Negative for back pain and neck pain.  Skin: Negative.   Allergic/Immunologic: Negative.   Neurological: Positive for dizziness (when changing positions). Negative for light-headedness.  Hematological: Negative for adenopathy. Does not bruise/bleed easily.  Psychiatric/Behavioral: Negative for dysphoric mood and sleep disturbance (sleeping on 2 pillows in the bed). The patient is not nervous/anxious.    Vitals:   07/23/17 1021  BP: (!) 92/55  Pulse: 80  Resp: 18  SpO2: 97%  Weight: 150 lb 8 oz (68.3 kg)  Height: 6\' 1"  (1.854 m)   Wt Readings from Last 3 Encounters:  07/23/17 150 lb 8 oz (68.3 kg)  07/18/17 166 lb 6 oz (75.5 kg)  07/01/17 160 lb 8 oz (72.8 kg)    Lab Results  Component Value Date   CREATININE 1.47 (H) 07/18/2017   CREATININE 1.19 07/03/2017   CREATININE 1.06 06/29/2017   Physical Exam  Constitutional: He is  oriented to person, place, and time. He appears well-developed and well-nourished.  HENT:  Head: Normocephalic and atraumatic.  Neck: Normal range of motion. Neck supple. No JVD present.  Cardiovascular: Normal rate and regular rhythm.  Pulmonary/Chest: Effort normal. He has no wheezes. He has no rales.  Abdominal: He exhibits no distension. There is no tenderness.  Musculoskeletal: He exhibits no edema or tenderness.  Neurological: He is alert and oriented to person, place, and time.  Skin: Skin is warm and dry.  Psychiatric: He has a normal mood and affect. His behavior is normal. Thought content normal.  Nursing note and vitals reviewed.  Assessment & Plan:  1: Chronic heart failure with reduced ejection fraction- - NYHA class II - euvolemic today - weighing daily.  Instructed to call for an overnight weight gain of >2 pounds or a weekly weight gain of >5 pounds - weight down 16 pounds since he was last here - not adding salt and is using Mrs Sharilyn Sites for seasoning. Discussed the importance of closely following a 2000mg  sodium diet  - saw cardiologist Kirke Corin) 07/01/17; returns 07/31/17 - has not had any alcohol or tobacco since he was admitted to the hospital - BMP from 07/18/17 reviewed and showed sodium 137, potassium 4.5 and GFR >60  - took 3 days of metolazone along with potassium - now on torsemide 40mg  BID - will check a BMP today since he took metolazone - drinking ~56 ounces of fluid daily  2: Hypotension- - BP much lower than previous visit - will decrease his torsemide to 40mg  AM (was BID) - follows at [facility]  Visit was done in the presence of patient's girlfriend Manuel Rosales) with patient's approval.   Medication bottles were reviewed.  Return in 3 weeks or sooner for any questions/problems before then.

## 2017-07-24 DIAGNOSIS — I959 Hypotension, unspecified: Secondary | ICD-10-CM | POA: Insufficient documentation

## 2017-07-25 ENCOUNTER — Other Ambulatory Visit: Payer: Self-pay

## 2017-07-25 ENCOUNTER — Telehealth: Payer: Self-pay | Admitting: Family

## 2017-07-25 ENCOUNTER — Encounter
Admission: RE | Admit: 2017-07-25 | Discharge: 2017-07-25 | Disposition: A | Discharge status: Home / Self Care | Payer: Medicaid Other | Source: Ambulatory Visit | Attending: Family | Admitting: Family

## 2017-07-25 DIAGNOSIS — I5022 Chronic systolic (congestive) heart failure: Secondary | ICD-10-CM | POA: Insufficient documentation

## 2017-07-25 DIAGNOSIS — I38 Endocarditis, valve unspecified: Secondary | ICD-10-CM

## 2017-07-25 LAB — BASIC METABOLIC PANEL
Anion gap: 8 (ref 5–15)
BUN: 49 mg/dL — AB (ref 6–20)
CALCIUM: 9.5 mg/dL (ref 8.9–10.3)
CO2: 32 mmol/L (ref 22–32)
CREATININE: 2.48 mg/dL — AB (ref 0.61–1.24)
Chloride: 94 mmol/L — ABNORMAL LOW (ref 101–111)
GFR calc Af Amer: 32 mL/min — ABNORMAL LOW (ref >60)
GFR, EST NON AFRICAN AMERICAN: 28 mL/min — AB (ref >60)
Glucose, Bld: 85 mg/dL (ref 65–99)
Potassium: 4.4 mmol/L (ref 3.5–5.1)
SODIUM: 134 mmol/L — AB (ref 135–145)

## 2017-07-25 NOTE — Telephone Encounter (Signed)
Spoke with patient and his girlfriend, Marcelino DusterMichelle, on speakerphone regarding lab results that were obtained today (07/25/17). Potassium remains normal and renal function is improving. Creatinine is now 2.48 and GFR is 32. Continue on the torsemide 40mg  once daily.  Sees cardiologist on 07/31/17 so will ask them to recheck it at that visit.

## 2017-07-31 ENCOUNTER — Ambulatory Visit: Payer: Medicaid Other | Admitting: Cardiovascular Disease

## 2017-08-14 LAB — ACID FAST CULTURE WITH REFLEXED SENSITIVITIES (MYCOBACTERIA): Acid Fast Culture: NEGATIVE

## 2017-08-14 LAB — ACID FAST SMEAR (AFB, MYCOBACTERIA): Acid Fast Smear: NEGATIVE

## 2017-08-18 NOTE — Progress Notes (Signed)
Patient ID: Manuel Rosales, male    DOB: [birth year], 52 y.o.   MRN: [id number]  HPI  [name] is a 52 y/o male with a history of HTN, HIV, psoas abscess, recent tobacco/alcohol use and chronic heart failure.   Echo report from 06/09/17 reviewed and shows an EF of 20-25% along with mild/mod TR and mildly elevated PA pressure of 40 mm Hg.   Was in the ED 06/29/17 due to shortness of breath. CT indicated pneumonia and he was treated and released. Admitted 06/08/17 due to NSTEMI and HF. Cardiology consult was obtained. Initially needed heparin drip and IV furosemide to start with. Transitioned to oral diuretics with a net loss of ~4L of fluid. Discharged home after 4 days.   He presents today for a follow-up visit with a chief complaint of minimal fatigue upon moderate exertion. He says this has been chronic over several months but has gradually been getting better. Has associated difficulty sleeping along with a gradual weight gain. He denies any chest pain, shortness of breath, edema, palpitations, abdominal distention, dizziness or anxiety.   Past Medical History:  Diagnosis Date  . CHF (congestive heart failure) (HCC) 06/2017   EF of 20-25%.  . Coronary artery disease 06/2017   Non-ST elevation myocardial infarction.  Treated medically with no cardiac cath done due to active infection and renal failure.  . Dermatitis   . HIV (human immunodeficiency virus infection) (HCC)   . Hypertension    Past Surgical History:  Procedure Laterality Date  . APPENDECTOMY    . IR RADIOLOGIST EVAL & MGMT  06/19/2017   Family History  Problem Relation Age of Onset  . Cerebral aneurysm Brother        died  . Hypertension Mother   . Diabetes Mother   . Heart attack Father    Social History   Tobacco Use  . Smoking status: Former Smoker    Packs/day: 0.50    Types: Cigarettes    Last attempt to quit: 06/11/2017    Years since quitting: 0.1  . Smokeless tobacco: Never Used  Substance Use Topics   . Alcohol use: No    Frequency: Never   No Known Allergies  Past Medical History:  Diagnosis Date  . CHF (congestive heart failure) (HCC) 06/2017   EF of 20-25%.  . Coronary artery disease 06/2017   Non-ST elevation myocardial infarction.  Treated medically with no cardiac cath done due to active infection and renal failure.  . Dermatitis   . HIV (human immunodeficiency virus infection) (HCC)   . Hypertension    Past Surgical History:  Procedure Laterality Date  . APPENDECTOMY    . IR RADIOLOGIST EVAL & MGMT  06/19/2017   Family History  Problem Relation Age of Onset  . Cerebral aneurysm Brother        died  . Hypertension Mother   . Diabetes Mother   . Heart attack Father    Social History   Tobacco Use  . Smoking status: Former Smoker    Packs/day: 0.50    Types: Cigarettes    Last attempt to quit: 06/11/2017    Years since quitting: 0.1  . Smokeless tobacco: Never Used  Substance Use Topics  . Alcohol use: No    Frequency: Never   No Known Allergies  Prior to Admission medications   Medication Sig Start Date End Date Taking? Authorizing Provider  aspirin EC 81 MG EC tablet Take 1 tablet (81 mg total) daily by  mouth. 06/12/17  Yes Enedina FinnerPatel, Sona, MD  atorvastatin (LIPITOR) 40 MG tablet Take 1 tablet (40 mg total) by mouth daily at 6 PM. 07/01/17  Yes Arida, Chelsea AusMuhammad A, MD  bictegravir-emtricitabine-tenofovir AF (BIKTARVY) 50-200-25 MG TABS tablet Take 1 tablet by mouth daily.   Yes [provider]  carvedilol (COREG) 12.5 MG tablet Take 1 tablet (12.5 mg total) by mouth 2 (two) times daily with a meal. 07/01/17  Yes Iran OuchArida, Muhammad A, MD  nitroGLYCERIN (NITROSTAT) 0.4 MG SL tablet Place 1 tablet (0.4 mg total) every 5 (five) minutes as needed under the tongue for chest pain. 06/12/17  Yes Enedina FinnerPatel, Sona, MD  sacubitril-valsartan (ENTRESTO) 24-26 MG Take 1 tablet by mouth 2 (two) times daily. 07/23/17  Yes Rynn Markiewicz, Inetta Fermoina A, FNP  sulfamethoxazole-trimethoprim  (BACTRIM DS,SEPTRA DS) 800-160 MG tablet Take 1 tablet by mouth daily.    Yes [provider]  torsemide (DEMADEX) 20 MG tablet Take 40 mg by mouth daily.   Yes [provider]  potassium chloride SA (K-DUR,KLOR-CON) 20 MEQ tablet Take 1 tablet (20 mEq total) by mouth daily. Patient not taking: Reported on 07/23/2017 07/18/17   Delma FreezeHackney, Alben Jepsen A, FNP    Review of Systems  Constitutional: Positive for fatigue. Negative for appetite change.  HENT: Negative for congestion, postnasal drip and sore throat.   Eyes: Negative.   Respiratory: Negative for chest tightness and shortness of breath.   Cardiovascular: Negative for chest pain, palpitations and leg swelling.  Gastrointestinal: Negative for abdominal distention and abdominal pain.  Endocrine: Negative.   Genitourinary: Negative.   Musculoskeletal: Negative for back pain and neck pain.  Skin: Negative.   Allergic/Immunologic: Negative.   Neurological: Negative for dizziness and light-headedness.  Hematological: Negative for adenopathy. Does not bruise/bleed easily.  Psychiatric/Behavioral: Positive for sleep disturbance (sleeping more during the day). Negative for dysphoric mood. The patient is not nervous/anxious.    Vitals:   08/20/17 1051  BP: 110/68  Pulse: 82  Resp: 18  SpO2: 100%  Weight: 160 lb 6 oz (72.7 kg)  Height: 6\' 1"  (1.854 m)   Wt Readings from Last 3 Encounters:  08/20/17 160 lb 6 oz (72.7 kg)  07/23/17 150 lb 8 oz (68.3 kg)  07/18/17 166 lb 6 oz (75.5 kg)   Lab Results  Component Value Date   CREATININE 2.39 (H) 08/20/2017   CREATININE 2.48 (H) 07/25/2017   CREATININE 3.58 (H) 07/23/2017    Physical Exam  Constitutional: He is oriented to person, place, and time. He appears well-developed and well-nourished.  HENT:  Head: Normocephalic and atraumatic.  Neck: Normal range of motion. Neck supple. No JVD present.  Cardiovascular: Normal rate and regular rhythm.  Pulmonary/Chest: Effort  normal. He has no wheezes. He has no rales.  Abdominal: He exhibits no distension. There is no tenderness.  Musculoskeletal: He exhibits no edema or tenderness.  Neurological: He is alert and oriented to person, place, and time.  Skin: Skin is warm and dry.  Psychiatric: He has a normal mood and affect. His behavior is normal. Thought content normal.  Nursing note and vitals reviewed.  Assessment & Plan:  1: Chronic heart failure with reduced ejection fraction- - NYHA class II - euvolemic today - weighing daily. Instructed to call for an overnight weight gain of >2 pounds or a weekly weight gain of >5 pounds - weight up 10 pounds since he was last here & he says that he's eating better - not adding salt and is using Mrs Sharilyn SitesDash  for seasoning. Discussed the importance of closely following a 2000mg  sodium diet  - saw cardiologist Kirke Corin) 07/01/17 and returns next week - has not had any alcohol or tobacco since he was admitted to the hospital - BMP from 07/25/17 reviewed and showed sodium 134, potassium 4.4 and GFR 32  - will get a BMP today to continue to monitor renal function - drinking ~56 ounces of fluid daily - PharmD reconciled medications with the patient  2: HTN- - BP better since torsemide decreased to just daily - unable to titrate up entresto at this time due to blood pressure - [facility] at [facility]  Visit was done in the presence of patient's girlfriend Julian Hy) with patient's approval.   Patient did not bring his medications nor a list. Each medication was verbally reviewed with the patient and he was encouraged to bring the bottles to every visit to confirm accuracy of list.  Return in 6 months or sooner for any questions/problems before then.

## 2017-08-20 ENCOUNTER — Encounter: Payer: Self-pay | Admitting: Family

## 2017-08-20 ENCOUNTER — Ambulatory Visit: Payer: Medicaid Other | Attending: Family | Admitting: Family

## 2017-08-20 VITALS — BP 110/68 | HR 82 | Resp 18 | Ht 73.0 in | Wt 160.4 lb

## 2017-08-20 DIAGNOSIS — I5022 Chronic systolic (congestive) heart failure: Secondary | ICD-10-CM | POA: Insufficient documentation

## 2017-08-20 DIAGNOSIS — Z21 Asymptomatic human immunodeficiency virus [HIV] infection status: Secondary | ICD-10-CM | POA: Insufficient documentation

## 2017-08-20 DIAGNOSIS — I2510 Atherosclerotic heart disease of native coronary artery without angina pectoris: Secondary | ICD-10-CM | POA: Insufficient documentation

## 2017-08-20 DIAGNOSIS — Z833 Family history of diabetes mellitus: Secondary | ICD-10-CM | POA: Insufficient documentation

## 2017-08-20 DIAGNOSIS — Z7982 Long term (current) use of aspirin: Secondary | ICD-10-CM | POA: Insufficient documentation

## 2017-08-20 DIAGNOSIS — Z8249 Family history of ischemic heart disease and other diseases of the circulatory system: Secondary | ICD-10-CM | POA: Diagnosis not present

## 2017-08-20 DIAGNOSIS — I110 Hypertensive heart disease with heart failure: Secondary | ICD-10-CM | POA: Diagnosis not present

## 2017-08-20 DIAGNOSIS — Z79899 Other long term (current) drug therapy: Secondary | ICD-10-CM | POA: Diagnosis not present

## 2017-08-20 DIAGNOSIS — I252 Old myocardial infarction: Secondary | ICD-10-CM | POA: Diagnosis not present

## 2017-08-20 DIAGNOSIS — Z87891 Personal history of nicotine dependence: Secondary | ICD-10-CM | POA: Insufficient documentation

## 2017-08-20 DIAGNOSIS — I10 Essential (primary) hypertension: Secondary | ICD-10-CM

## 2017-08-20 LAB — BASIC METABOLIC PANEL
Anion gap: 11 (ref 5–15)
BUN: 30 mg/dL — AB (ref 6–20)
CALCIUM: 9.2 mg/dL (ref 8.9–10.3)
CO2: 25 mmol/L (ref 22–32)
CREATININE: 2.39 mg/dL — AB (ref 0.61–1.24)
Chloride: 98 mmol/L — ABNORMAL LOW (ref 101–111)
GFR calc Af Amer: 34 mL/min — ABNORMAL LOW (ref >60)
GFR calc non Af Amer: 29 mL/min — ABNORMAL LOW (ref >60)
Glucose, Bld: 68 mg/dL (ref 65–99)
Potassium: 4.6 mmol/L (ref 3.5–5.1)
SODIUM: 134 mmol/L — AB (ref 135–145)

## 2017-08-20 NOTE — Patient Instructions (Signed)
Continue weighing daily and call for an overnight weight gain of > 2 pounds or a weekly weight gain of >5 pounds. 

## 2017-08-21 LAB — REFLEX TO GENOSURE(R) MG EDI: HIV GenoSure(R): 1

## 2017-08-23 NOTE — Progress Notes (Signed)
Cardiology Office Note Date:  08/25/2017  Patient ID:  Manuel Rosales, Manuel Rosales 05-16-1966, MRN [id number] PCP:  [facility]  Cardiologist:  Dr. Kirke Corin, MD    Chief Complaint: Follow up systolic CHF  History of Present Illness: Manuel Rosales is a 52 y.o. male with history of CAD s/p NSTEMI in 06/2017 in the setting of an abdominal abscess, HIV not currently on medications, HTN, and tobacco abuse who presents for follow up chronic systolic CHF.   He was admitted in 06/2017 with SOB, orthopnea, and abdominal swelling with poorly controlled hypertension. CT abdomen and pelvis showed fluid collection involving the right psoas/ileus muscles. His troponin peaked at 1.87. Echo showed an EF of 20-25% , difuse hypokinesis, mild to moderate tricuspid regurgitation, and mild pulmonary hypertension. Cardiac catheterization was not performed due to active infection and acute renal failure. He underwent CT-guided drainage of the abscess and was treated with antibiotics. He was seen in the ED on 06/29/17 with worsening SOB. Troponin was mildly elevated a 0.04 and not trended. CBC showed WBC 2.4, HGB 12.9, PLT 188 with platelet clumps noted. BUN/SCr 27/1.06, K+ 3.9, albumin 2.9. CTA chest negative for PE, though there was concern for bilateral upper lobe PNA with race bilateral pleural effusions. EKG showed NSR, 85 bpm, right axis deviation, diffuse TWI. He was discharged with outpatient follow up and was given azithromycin along with amoxicillin in the ED. He was most recently seen by Dr. Kirke Corin on 07/01/17 and noted worsening orthopnea and PND. His weight ahd increased from 146-->152 pounds, and 160 pounds at that visit. He was felt to be volume overloaded and Lasix 20 mg once daily was added to his medication regimen along with increasing his Coreg to 12.5 mg bid. He was continued on Entresto. He has since been seen in the [facility] on 07/18/17 with a weight up to 166 pounds (up 14  pounds from prior CHF visit) and had a BP of 147/114. His Lasix was changed to torsemide 20 mg daily (though what was sent in was torsemide 40 mg bid) and he was advised to take metolazone 2.5 mg daily x 3 days. Labs checked 07/18/17 showed a BUN/SCr 22/1.47, K+ 4.5. In follow up with the [facility] on 07/23/17 his weight was noted to be 150 pounds, BP 92/55. His breathing had improved, though he noted positional dizziness. Follow up labs checked that day in the [facility] showed worsening renal function with a BUN/SCr 56/3.58 (basline SCr 1-1.4), CO2 of 34. Torsemide was decreased from 40 mg bid to 40 mg daily. Recheck bmet at the CHF Clinic on 12/21 showed BUN/SCR 49/2.48, K+ 4.4. He was continued on torsemide 40 mg daily. Most recently seen by the [facility] on 08/10/2017 with a weight of 160 pounds, BP 110/68. No changes were made. Labs from 08/20/2017 showed a BUN/SCr 30/2.39, K+ 4.6 with hemolysis noted.   He comes in doing well today.  He reports he is getting over a viral URI.  No shortness of breath.  Stable 2 pillow orthopnea.  No early satiety.  No lower extremity swelling.  Continues to eat foods high in salt.  Drinks greater than 2 L of fluids daily.  Reports compliance with medications.  Weight at home has been slowly trending up over the month of January with a documented home weight of 147 pounds on 1/1 and a documented home weight of 156 pounds on 1/21.  Patient reports this is secondary  to increased p.o. food intake and he does not feel like this is fluid.  No chest pain, palpitations, diaphoresis, nausea, vomiting, dizziness, presyncope, or syncope.   Past Medical History:  Diagnosis Date  . CHF (congestive heart failure) (HCC) 06/2017   EF of 20-25%.  . Coronary artery disease 06/2017   Non-ST elevation myocardial infarction.  Treated medically with no cardiac cath done due to active infection and renal failure.  . Dermatitis   . HIV (human  immunodeficiency virus infection) (HCC)   . Hypertension     Past Surgical History:  Procedure Laterality Date  . APPENDECTOMY    . IR RADIOLOGIST EVAL & MGMT  06/19/2017    Current Meds  Medication Sig  . aspirin EC 81 MG EC tablet Take 1 tablet (81 mg total) daily by mouth.  Marland Kitchen atorvastatin (LIPITOR) 40 MG tablet Take 1 tablet (40 mg total) by mouth daily at 6 PM.  . bictegravir-emtricitabine-tenofovir AF (BIKTARVY) 50-200-25 MG TABS tablet Take 1 tablet by mouth daily.  . carvedilol (COREG) 12.5 MG tablet Take 1 tablet (12.5 mg total) by mouth 2 (two) times daily with a meal.  . nitroGLYCERIN (NITROSTAT) 0.4 MG SL tablet Place 1 tablet (0.4 mg total) every 5 (five) minutes as needed under the tongue for chest pain.  . sacubitril-valsartan (ENTRESTO) 24-26 MG Take 1 tablet by mouth 2 (two) times daily.  Marland Kitchen sulfamethoxazole-trimethoprim (BACTRIM DS,SEPTRA DS) 800-160 MG tablet Take 1 tablet by mouth daily.   Marland Kitchen torsemide (DEMADEX) 20 MG tablet Take 40 mg by mouth daily.    Allergies:   Patient has no known allergies.   Social History:  The patient  reports that he quit smoking about 2 months ago. His smoking use included cigarettes. He smoked 0.50 packs per day. he has never used smokeless tobacco. He reports that he uses drugs. Drug: Marijuana. He reports that he does not drink alcohol.   Family History:  The patient's family history includes Cerebral aneurysm in his brother; Diabetes in his mother; Heart attack in his father; Hypertension in his mother.  ROS:   Review of Systems  Constitutional: Positive for malaise/fatigue. Negative for chills, diaphoresis, fever and weight loss.  HENT: Positive for congestion, sinus pain and sore throat.   Eyes: Negative for discharge and redness.  Respiratory: Positive for cough and sputum production. Negative for hemoptysis, shortness of breath and wheezing.        Green to yellow sputum  Cardiovascular: Positive for orthopnea. Negative for  chest pain, palpitations, claudication, leg swelling and PND.  Gastrointestinal: Negative for abdominal pain, blood in stool, heartburn, melena, nausea and vomiting.  Genitourinary: Negative for hematuria.  Musculoskeletal: Negative for falls and myalgias.  Skin: Negative for rash.  Neurological: Negative for dizziness, tingling, tremors, sensory change, speech change, focal weakness, loss of consciousness and weakness.  Endo/Heme/Allergies: Does not bruise/bleed easily.  Psychiatric/Behavioral: Negative for substance abuse. The patient is not nervous/anxious.   All other systems reviewed and are negative.    PHYSICAL EXAM:  VS:  BP 100/70 (BP Location: Left Arm, Patient Position: Sitting, Cuff Size: Normal)   Pulse 81   Ht 6\' 1"  (1.854 m)   Wt 158 lb 8 oz (71.9 kg)   BMI 20.91 kg/m  BMI: Body mass index is 20.91 kg/m.  Physical Exam  Constitutional: He is oriented to person, place, and time. He appears well-developed and well-nourished.  HENT:  Head: Normocephalic and atraumatic.  Eyes: Right eye exhibits no discharge. Left eye exhibits  no discharge.  Neck: Normal range of motion. No JVD present.  Cardiovascular: Normal rate, regular rhythm, S1 normal and S2 normal. Exam reveals no distant heart sounds, no friction rub, no midsystolic click and no opening snap.  Murmur heard.  Systolic murmur is present with a grade of 2/6 at the upper left sternal border. Pulses:      Posterior tibial pulses are 2+ on the right side, and 2+ on the left side.  Pulmonary/Chest: Effort normal and breath sounds normal. No respiratory distress. He has no decreased breath sounds. He has no wheezes. He has no rales. He exhibits no tenderness.  Abdominal: Soft. He exhibits no distension. There is no tenderness.  Musculoskeletal: He exhibits no edema.  Neurological: He is alert and oriented to person, place, and time.  Skin: Skin is warm and dry. No cyanosis. Nails show no clubbing.  Psychiatric: He has  a normal mood and affect. His speech is normal and behavior is normal. Judgment and thought content normal.     EKG:  Was ordered and interpreted by me today. Shows NSR, 81 bpm, no acute ST-T changes  Recent Labs: 06/08/2017: B Natriuretic Peptide 4,154.0 06/11/2017: Magnesium 2.0 06/29/2017: ALT 23; Hemoglobin 12.9; Platelets 188 08/25/2017: BUN 30; Creatinine, Ser 2.06; Potassium 4.8; Sodium 135  06/09/2017: Cholesterol 123; HDL 21; LDL Cholesterol 83; Total CHOL/HDL Ratio 5.9; Triglycerides 94; VLDL 19   Estimated Creatinine Clearance: 42.2 mL/min (A) (by C-G formula based on SCr of 2.06 mg/dL (H)).   Wt Readings from Last 3 Encounters:  08/25/17 158 lb 8 oz (71.9 kg)  08/20/17 160 lb 6 oz (72.7 kg)  07/23/17 150 lb 8 oz (68.3 kg)     Other studies reviewed: Additional studies/records reviewed today include: summarized above  ASSESSMENT AND PLAN:  1. Chronic systolic CHF/ICM: Patient's weight has trended up 9 pounds over the course of the month of January.  Patient feels like this is secondary to better appetite and a fairly sedentary lifestyle.  He does continue to eat foods high in salt and drinks greater than 2 L of fluids daily.  He was advised to decrease salt intake and drink less than 2 L of fluids daily.  Will await results of renal function ordered today prior to providing further instructions on diuretic dosage given his slow upward weight trend in the setting of his ARF as detailed below.  Would recommend patient take an extra dose of torsemide 20 mg for weight gain greater than 3 pounds overnight or 5 pounds in 1 week.  Continue carvedilol and Entresto.  BP precludes further titration of either at this time.  Not on spironolactone given recent acute renal failure.  2. CAD: Currently without symptoms concerning for angina.  Continue medical treatment with ASA, Coreg, and Lipitor.  He will require a right and left cardiac catheterization in the near future given his above  cardiomyopathy.  This was initially delayed in the setting of his abdominal abscess and AKI while admitted in 06/2017.  His current acute renal failure precludes moving forward with cardiac catheterization at this time.  We will need to keep a close eye on his renal function and schedule right and left cardiac catheterization accordingly.  3. Acute renal failure: Baseline serum creatinine of 1.0-1.4.  Following transition from Lasix 20 mg daily to torsemide 40 mg twice daily along with 3 days of metolazone 2.5 mg at outside clinic patient was noted to have worsening renal function with a serum creatinine of 3.58.  This prompted outside clinic to decrease the patient's torsemide to 40 mg daily with follow-up bmet showing a serum creatinine of 2.4 followed by recheck on 08/20/17 showing a serum creatinine of 2.3.  He remains on torsemide 40 mg daily.  Check bmet today to assess renal function trend.  If renal function continues to remain above his baseline as above, we will referred the patient to nephrology as his new baseline serum creatinine may be higher than previously.  Also, pending renal function trend we may need to consider discontinuing Entresto.  Would attempt to avoid further metolazone usage, especially at such a low standing p.o. diuretic dose.  This was discussed in detail with the patient today.  4. Hyperlipidemia: Continue Lipitor.  Plan for follow-up lipid and liver at follow-up visit.  Disposition: F/u with me in 1 week.  Current medicines are reviewed at length with the patient today.  The patient did not have any concerns regarding medicines.  Signed, Eula Listenyan Parnika Tweten, PA-C 08/25/2017 4:05 PM     [facility] [address] [place], [place] [address] [phone]

## 2017-08-25 ENCOUNTER — Encounter: Payer: Self-pay | Admitting: Physician Assistant

## 2017-08-25 ENCOUNTER — Ambulatory Visit (INDEPENDENT_AMBULATORY_CARE_PROVIDER_SITE_OTHER): Payer: Medicaid Other | Admitting: Physician Assistant

## 2017-08-25 ENCOUNTER — Telehealth: Payer: Self-pay | Admitting: Physician Assistant

## 2017-08-25 ENCOUNTER — Other Ambulatory Visit
Admission: RE | Admit: 2017-08-25 | Discharge: 2017-08-25 | Disposition: A | Discharge status: Home / Self Care | Payer: Medicaid Other | Source: Ambulatory Visit | Attending: Physician Assistant | Admitting: Physician Assistant

## 2017-08-25 VITALS — BP 100/70 | HR 81 | Ht 73.0 in | Wt 158.5 lb

## 2017-08-25 DIAGNOSIS — I5022 Chronic systolic (congestive) heart failure: Secondary | ICD-10-CM | POA: Diagnosis present

## 2017-08-25 DIAGNOSIS — N179 Acute kidney failure, unspecified: Secondary | ICD-10-CM

## 2017-08-25 DIAGNOSIS — I255 Ischemic cardiomyopathy: Secondary | ICD-10-CM

## 2017-08-25 DIAGNOSIS — I2510 Atherosclerotic heart disease of native coronary artery without angina pectoris: Secondary | ICD-10-CM

## 2017-08-25 DIAGNOSIS — N184 Chronic kidney disease, stage 4 (severe): Secondary | ICD-10-CM

## 2017-08-25 DIAGNOSIS — E782 Mixed hyperlipidemia: Secondary | ICD-10-CM

## 2017-08-25 DIAGNOSIS — N189 Chronic kidney disease, unspecified: Secondary | ICD-10-CM

## 2017-08-25 LAB — BASIC METABOLIC PANEL
ANION GAP: 11 (ref 5–15)
BUN: 30 mg/dL — ABNORMAL HIGH (ref 6–20)
CO2: 28 mmol/L (ref 22–32)
Calcium: 9.3 mg/dL (ref 8.9–10.3)
Chloride: 96 mmol/L — ABNORMAL LOW (ref 101–111)
Creatinine, Ser: 2.06 mg/dL — ABNORMAL HIGH (ref 0.61–1.24)
GFR calc Af Amer: 41 mL/min — ABNORMAL LOW (ref >60)
GFR, EST NON AFRICAN AMERICAN: 35 mL/min — AB (ref >60)
Glucose, Bld: 118 mg/dL — ABNORMAL HIGH (ref 65–99)
POTASSIUM: 4.8 mmol/L (ref 3.5–5.1)
Sodium: 135 mmol/L (ref 135–145)

## 2017-08-25 NOTE — Patient Instructions (Signed)
Medication Instructions: - Your physician recommends that you continue on your current medications as directed. Please refer to the Current Medication list given to you today.  Labwork: - Your physician recommends that you have lab work today: [employer]  Procedures/Testing: - none ordered  Follow-Up: - Your physician recommends that you schedule a follow-up appointment in: 1 week with Alycia Rossettiyan, PA   Any Additional Special Instructions Will Be Listed Below (If Applicable).     If you need a refill on your cardiac medications before your next appointment, please call your pharmacy.

## 2017-08-25 NOTE — Telephone Encounter (Signed)
Notes recorded by Jefferey PicaMcGhee, Page Pucciarelli C, RN on 08/25/2017 at 5:11 PM EST The patient is aware of his results.  He verbalizes understanding to continue entresto, continue torsemide at 40 mg once daily with an extra 20 mg as needed for a weight gain of 3 lbs/ more in 24 hours or 5 lbs in 1 week.  He is agreeable with a referral to nephrology. He is scheduled to see Alycia RossettiRyan, [place] back on 09/02/17. ------  Notes recorded by Sondra Bargesunn, Ryan M, PA-C on 08/25/2017 at 4:41 PM EST Please call the patient. Renal function continues to slowly improve. Currently serum creatinine of 2.06 (prior baseline 1.0-1.4). Potassium at goal. Given slow improvement noted and renal function we will continue Entresto at this time. Recommend patient continue torsemide 40 mg once daily with an extra 20 mg as needed weight gain greater than 3 pounds overnight or 5 pounds and 7-day time span. Please refer patient to nephrology for CKD. Follow-up as planned.

## 2017-08-27 NOTE — Telephone Encounter (Signed)
I called and spoke with [facility] regarding a referral for the patient for chronic kidney disease.  Appt scheduled for Thursday 09/18/17 at 3:00 pm with Dr. Thedore MinsSingh.  Records faxed to [phone]. Confirmation received.    I have spoken with the patient and advised him of the date/ time/ location/ and physician that [facility] will [facility]. The address and phone # were provided to him. I advised him if the appointment date and time did not work for him then he could call their office directly to reschedule. He verbalizes understanding.

## 2017-09-01 ENCOUNTER — Encounter: Payer: Self-pay | Admitting: Physician Assistant

## 2017-09-01 NOTE — Progress Notes (Signed)
Cardiology Office Note Date:  09/02/2017  Patient ID:  Manuel Rosales, Manuel Rosales 1966-01-03, MRN [id number] PCP:  [facility]  Cardiologist:  Dr. Kirke Corin, MD   Chief Complaint: Follow up systolic CHF  History of Present Illness: Manuel Rosales is a 52 y.o. male with history of CAD s/p NSTEMI in 06/2017 in the setting of an abdominal abscess, chronic systolic CHF, pulmonary HTN, HIV, ARF, HTN, and tobacco abuse who presents for follow up chronic systolic CHF.   He was admitted in 06/2017 with SOB, orthopnea, and abdominal swelling with poorly controlled hypertension. CT abdomen and pelvis showed fluid collection involving the right psoas/ileus muscles. His troponin peaked at 1.87. Echo 06/09/17, showed an EF of 20-25% , difuse hypokinesis, mild to moderate tricuspid regurgitation, and mild pulmonary hypertension. Cardiac catheterization was not performed due to active infection and acute renal failure. He underwent CT-guided drainage of the abscess and was treated with antibiotics. He was seen in the ED on 06/29/17 with worsening SOB. Troponin was mildly elevated a 0.04 and not trended. CBC showed WBC 2.4, HGB 12.9, PLT 188 with platelet clumps noted. BUN/SCr 27/1.06, K+ 3.9, albumin 2.9. CTA chest negative for PE, though there was concern for bilateral upper lobe PNA with trace bilateral pleural effusions. EKG showed NSR, 85 bpm, right axis deviation, diffuse TWI. He was discharged with outpatient follow up and was given azithromycin along with amoxicillin in the ED. He was seen by Dr. Kirke Corin on 07/01/17 and noted worsening orthopnea and PND. His weight had increased from 146-->152 pounds, and 160 pounds at that visit. He was felt to be volume overloaded and Lasix 20 mg once daily was added to his medication regimen along with increasing his Coreg to 12.5 mg bid. He was continued on Entresto. He was seen in the [facility] on 07/18/17 with a weight up to 166 pounds (up 14  pounds from prior CHF visit) and had a BP of 147/114. His Lasix was changed to torsemide 20 mg daily (though what was sent in was torsemide 40 mg bid) and he was advised to take metolazone 2.5 mg daily x 3 days. Labs checked that day, 07/18/17, showed a BUN/SCr 22/1.47, K+ 4.5. In follow up with the [facility] on 07/23/17 his weight was noted to be 150 pounds, BP 92/55. His breathing had improved, though he noted positional dizziness. Follow up labs checked 07/23/17, in the [facility] showed worsening renal function with a BUN/SCr 56/3.58 (basline SCr 1-1.4), CO2 of 34. Torsemide was decreased from 40 mg bid to 40 mg daily. Recheck bmet at the CHF Clinic on 12/21 showed BUN/SCR 49/2.48, K+ 4.4. He was continued on torsemide 40 mg daily. Most recently seen by the [facility] on 08/20/2017 with a weight of 160 pounds, BP 110/68. No changes were made. Labs from 08/20/2017 showed a BUN/SCr 30/2.39, K+ 4.6 with hemolysis noted. I saw him on 08/25/2017 for follow up of his CHF and he was doing well at that time. He was noted to conitnue to eat foods high in salt and was drinking > 2 L of fluids daily. He reported compliance with medications. His weight log showed his weight had been slowly trending up over the month of January from 147-->156 pounds. Patient felt like this was secondary to food intake. Recheck bmet on 08/25/17, showed a BUN/SCr 30/2.06, CO2 28, K+ 4.8. He was continued on torsemide 40 mg daily with an extra 20 mg prn for weight  gain/SOB. He was also referred to nephrology given worsening renal function.  He comes in accompanied with his girlfriend today.  Both the patient and his girlfriend report he is doing well.  Weights at home have been stable in the mid 150s.  Continues to watch his sodium intake.  Drinking approximately 2 L of fluids daily, give or take.  Compliant with medications.  No shortness of breath, abdominal fullness, lower extremity swelling, early satiety,  PND, dizziness, presyncope, syncope, palpitations, or chest pain.  Stable 2-pillow orthopnea. Both patient and girlfriend feel like he is doing significantly better than when he was hospitalized back in the winter 2018.  Both patient and girlfriend are pleased that he is able to finish complete meals at this time.   Past Medical History:  Diagnosis Date  . Chronic systolic CHF (congestive heart failure) (HCC) 06/2017   a. TTE 11/18: EF of 20-25% , difuse hypokinesis, mild to moderate tricuspid regurgitation, and mild pulmonary hypertension  . Coronary artery disease 06/2017   Non-ST elevation myocardial infarction.  Treated medically with no cardiac cath done due to active infection and renal failure.  . Dermatitis   . HIV (human immunodeficiency virus infection) (HCC)   . Hypertension   . Pulmonary hypertension (HCC)    a. TTE 11/18: EF of 20-25% , difuse hypokinesis, mild to moderate tricuspid regurgitation, and mild pulmonary hypertension w/ PASP 40 mmHg    Past Surgical History:  Procedure Laterality Date  . APPENDECTOMY    . IR RADIOLOGIST EVAL & MGMT  06/19/2017    Current Meds  Medication Sig  . aspirin EC 81 MG EC tablet Take 1 tablet (81 mg total) daily by mouth.  Marland Kitchen atorvastatin (LIPITOR) 40 MG tablet Take 1 tablet (40 mg total) by mouth daily at 6 PM.  . bictegravir-emtricitabine-tenofovir AF (BIKTARVY) 50-200-25 MG TABS tablet Take 1 tablet by mouth daily.  . carvedilol (COREG) 12.5 MG tablet Take 1 tablet (12.5 mg total) by mouth 2 (two) times daily with a meal.  . nitroGLYCERIN (NITROSTAT) 0.4 MG SL tablet Place 1 tablet (0.4 mg total) every 5 (five) minutes as needed under the tongue for chest pain.  . sacubitril-valsartan (ENTRESTO) 24-26 MG Take 1 tablet by mouth 2 (two) times daily.  Marland Kitchen sulfamethoxazole-trimethoprim (BACTRIM DS,SEPTRA DS) 800-160 MG tablet Take 1 tablet by mouth daily.   Marland Kitchen torsemide (DEMADEX) 20 MG tablet Take 2 tablets (40 mg) by mouth once daily. May  take 1 additional tablet (20 mg) as needed for shortness of breath/ weight gain of 3 lbs  . [DISCONTINUED] atorvastatin (LIPITOR) 40 MG tablet Take 1 tablet (40 mg total) by mouth daily at 6 PM.  . [DISCONTINUED] carvedilol (COREG) 12.5 MG tablet Take 1 tablet (12.5 mg total) by mouth 2 (two) times daily with a meal.  . [DISCONTINUED] sacubitril-valsartan (ENTRESTO) 24-26 MG Take 1 tablet by mouth 2 (two) times daily.  . [DISCONTINUED] torsemide (DEMADEX) 20 MG tablet Take 2 tablets (40 mg) by mouth once daily. May take 1 additional tablet (20 mg) as needed for shortness of breath/ weight gain of 3 lbs    Allergies:   Patient has no known allergies.   Social History:  The patient  reports that he quit smoking about 2 months ago. His smoking use included cigarettes. He smoked 0.50 packs per day. he has never used smokeless tobacco. He reports that he uses drugs. Drug: Marijuana. He reports that he does not drink alcohol.   Family History:  The patient's  family history includes Cerebral aneurysm in his brother; Diabetes in his mother; Heart attack in his father; Hypertension in his mother.  ROS:   Review of Systems  Constitutional: Negative for chills, diaphoresis, fever, malaise/fatigue and weight loss.  HENT: Negative for congestion.   Eyes: Negative for discharge and redness.  Respiratory: Negative for cough, hemoptysis, sputum production, shortness of breath and wheezing.   Cardiovascular: Negative for chest pain, palpitations, orthopnea, claudication, leg swelling and PND.  Gastrointestinal: Negative for abdominal pain, blood in stool, heartburn, melena, nausea and vomiting.  Genitourinary: Negative for hematuria.  Musculoskeletal: Negative for falls and myalgias.  Skin: Negative for rash.  Neurological: Negative for dizziness, tingling, tremors, sensory change, speech change, focal weakness, loss of consciousness and weakness.  Endo/Heme/Allergies: Does not bruise/bleed easily.    Psychiatric/Behavioral: Negative for substance abuse. The patient is not nervous/anxious.   All other systems reviewed and are negative.    PHYSICAL EXAM:  VS:  BP 105/72 (BP Location: Left Arm, Patient Position: Sitting, Cuff Size: Normal)   Pulse 89   Ht 6\' 1"  (1.854 m)   Wt 161 lb (73 kg)   BMI 21.24 kg/m  BMI: Body mass index is 21.24 kg/m.  Physical Exam  Constitutional: He is oriented to person, place, and time. He appears well-developed and well-nourished.  HENT:  Head: Normocephalic and atraumatic.  Eyes: Right eye exhibits no discharge. Left eye exhibits no discharge.  Neck: Normal range of motion. No JVD present.  Cardiovascular: Normal rate, regular rhythm, S1 normal, S2 normal and normal heart sounds. Exam reveals no distant heart sounds, no friction rub, no midsystolic click and no opening snap.  No murmur heard. Pulses:      Posterior tibial pulses are 2+ on the right side, and 2+ on the left side.  Pulmonary/Chest: Effort normal and breath sounds normal. No respiratory distress. He has no decreased breath sounds. He has no wheezes. He has no rales. He exhibits no tenderness.  Abdominal: Soft. He exhibits no distension. There is no tenderness.  Musculoskeletal: He exhibits no edema.  Neurological: He is alert and oriented to person, place, and time.  Skin: Skin is warm and dry. No cyanosis. Nails show no clubbing.  Psychiatric: He has a normal mood and affect. His speech is normal and behavior is normal. Judgment and thought content normal.     EKG:  Was not ordered today.   Recent Labs: 06/08/2017: B Natriuretic Peptide 4,154.0 06/11/2017: Magnesium 2.0 06/29/2017: ALT 23; Hemoglobin 12.9; Platelets 188 08/25/2017: BUN 30; Creatinine, Ser 2.06; Potassium 4.8; Sodium 135  06/09/2017: Cholesterol 123; HDL 21; LDL Cholesterol 83; Total CHOL/HDL Ratio 5.9; Triglycerides 94; VLDL 19   Estimated Creatinine Clearance: 42.8 mL/min (A) (by C-G formula based on SCr of 2.06  mg/dL (H)).   Wt Readings from Last 3 Encounters:  09/02/17 161 lb (73 kg)  08/25/17 158 lb 8 oz (71.9 kg)  08/20/17 160 lb 6 oz (72.7 kg)     Other studies reviewed: Additional studies/records reviewed today include: summarized above  ASSESSMENT AND PLAN:  1. Chronic systolic CHF/presumed ICM: He does not appear grossly volume overloaded at this time.  There is no lower extremity swelling, no abdominal distention, no crackles on exam, and no elevated JVD.  Weight is up 3 pounds from his visit on 1/21 with the patient and his girlfriend feeling like this is secondary to increased p.o. intake and not fluid retention.  He has not needed to take any of his as needed  torsemide 20 mg for shortness of breath/weight gain.  Continues to watch his sodium intake.  Drinking approximately 2 L of fluids daily.  Current dose of torsemide appears to be keeping up with this.  Continue carvedilol 12.5 mg twice daily, Entresto 24/26 mg twice daily, and torsemide 40 mg daily with 20 mg as needed shortness of breath or weight gain greater than 3 pounds overnight.  Relative hypotension with systolic blood pressure of 105 mmHg precludes further titration of his evidence-based heart failure regimen at this time.  I will also defer initiating spironolactone at this time given his recent acute renal failure as outlined below.  Ultimately, he will require an ischemic evaluation to further evaluate his cardiomyopathy, preferably cardiac catheterization, however this has been delayed given his AKI and infection while inpatient and more recently, acute renal failure secondary to presumed overdiuresis as an outpatient by outside clinic.  Check BMP today and if renal function continues to improve consider outpatient diagnostic cardiac catheterization.  If renal function has plateaued with a serum creatinine approximately 2 may need to pursue noninvasive ischemic evaluation at least initially.  CHF education.  Patient plans to no  longer follow-up with the [facility] clinic.  2. CAD: No symptoms concerning for angina at this time.  Has been medically managed as above.  Continue current treatment with aspirin, Coreg, and Lipitor.  He will require an ischemic evaluation as outlined above.  Renal function trend will determine this course.  3. Acute on CKD stage III: Baseline serum creatinine of 1.0-1.4.  Following transition from Lasix 20 mg daily to torsemide 40 mg twice daily followed by 3 days of metolazone 2.5 mg by outside clinic patient was noted to have worsening serum creatinine of 3.58.  This prompted outside clinic to decrease patient's torsemide to 40 mg daily with follow-up bmet showing a serum creatinine of 2.4 trending to 2.3 on 08/20/17.  When I saw him on 1/21 he remained on torsemide 40 mg daily and did not appear volume overloaded.  We will recheck BMP on 1/21 showed a serum creatinine of 2.06.  He was continued on torsemide 40 mg daily with 20 mg as needed as above.  He was also referred to nephrology for further evaluation of his kidney disease.  His appointment with nephrology is scheduled for mid February.  We will check a BMP today to further trend his renal disease and evaluate for stability while on Entresto.  Would avoid further metolazone usage at this time especially in light of him being on a low-dose loop diuretic.  4. Hyperlipidemia: Continue Lipitor.  Plan for fasting lipid and liver function at follow-up visit.  Disposition: F/u with me in 3 months.   Current medicines are reviewed at length with the patient today.  The patient did not have any concerns regarding medicines.  Signed, Eula Listen, PA-C 09/02/2017 3:19 PM     [facility] [address] [place], [place] [address] [phone]

## 2017-09-02 ENCOUNTER — Ambulatory Visit (INDEPENDENT_AMBULATORY_CARE_PROVIDER_SITE_OTHER): Payer: Medicaid Other | Admitting: Physician Assistant

## 2017-09-02 ENCOUNTER — Encounter: Payer: Self-pay | Admitting: Physician Assistant

## 2017-09-02 VITALS — BP 105/72 | HR 89 | Ht 73.0 in | Wt 161.0 lb

## 2017-09-02 DIAGNOSIS — I5022 Chronic systolic (congestive) heart failure: Secondary | ICD-10-CM | POA: Diagnosis not present

## 2017-09-02 DIAGNOSIS — I2510 Atherosclerotic heart disease of native coronary artery without angina pectoris: Secondary | ICD-10-CM | POA: Diagnosis not present

## 2017-09-02 DIAGNOSIS — I255 Ischemic cardiomyopathy: Secondary | ICD-10-CM

## 2017-09-02 DIAGNOSIS — N183 Chronic kidney disease, stage 3 (moderate): Secondary | ICD-10-CM | POA: Diagnosis not present

## 2017-09-02 DIAGNOSIS — E785 Hyperlipidemia, unspecified: Secondary | ICD-10-CM | POA: Diagnosis not present

## 2017-09-02 DIAGNOSIS — N179 Acute kidney failure, unspecified: Secondary | ICD-10-CM

## 2017-09-02 MED ORDER — TORSEMIDE 20 MG PO TABS: ORAL_TABLET | ORAL | 6 refills | Status: DC

## 2017-09-02 MED ORDER — CARVEDILOL 12.5 MG PO TABS: 12.5000 mg | ORAL_TABLET | Freq: Two times a day (BID) | ORAL | 11 refills | Status: DC

## 2017-09-02 MED ORDER — SACUBITRIL-VALSARTAN 24-26 MG PO TABS: 1.0000 | ORAL_TABLET | Freq: Two times a day (BID) | ORAL | 6 refills | Status: DC

## 2017-09-02 MED ORDER — ATORVASTATIN CALCIUM 40 MG PO TABS: 40.0000 mg | ORAL_TABLET | Freq: Every day | ORAL | 11 refills | Status: DC

## 2017-09-02 NOTE — Patient Instructions (Signed)
Medication Instructions: - Your physician has recommended you make the following change in your medication:  1) You may take an additional torsemide 20 mg once daily if needed for increased shortness of breath/ weight gain of 3 lbs or more in 24 hours.    Labwork: - Your physician recommends that you have lab work today: [employer]   Procedures/Testing: - none ordered  Follow-Up: - Your physician recommends that you schedule a follow-up appointment in: 1 month with Dr. Alvera SinghArida/ Ryan Dunn, PA   Any Additional Special Instructions Will Be Listed Below (If Applicable).     If you need a refill on your cardiac medications before your next appointment, please call your pharmacy.

## 2017-09-03 ENCOUNTER — Other Ambulatory Visit: Payer: Self-pay | Admitting: *Deleted

## 2017-09-03 ENCOUNTER — Other Ambulatory Visit
Admission: RE | Admit: 2017-09-03 | Discharge: 2017-09-03 | Disposition: A | Discharge status: Home / Self Care | Payer: Medicaid Other | Source: Ambulatory Visit | Attending: Physician Assistant | Admitting: Physician Assistant

## 2017-09-03 DIAGNOSIS — E875 Hyperkalemia: Secondary | ICD-10-CM | POA: Insufficient documentation

## 2017-09-03 LAB — BASIC METABOLIC PANEL
Anion gap: 9 (ref 5–15)
BUN / CREAT RATIO: 12 (ref 9–20)
BUN: 28 mg/dL — AB (ref 6–24)
BUN: 32 mg/dL — ABNORMAL HIGH (ref 6–20)
CALCIUM: 9.2 mg/dL (ref 8.9–10.3)
CO2: 26 mmol/L (ref 20–29)
CO2: 27 mmol/L (ref 22–32)
CREATININE: 2.36 mg/dL — AB (ref 0.76–1.27)
Calcium: 10 mg/dL (ref 8.7–10.2)
Chloride: 95 mmol/L — ABNORMAL LOW (ref 96–106)
Chloride: 96 mmol/L — ABNORMAL LOW (ref 101–111)
Creatinine, Ser: 2.34 mg/dL — ABNORMAL HIGH (ref 0.61–1.24)
GFR calc Af Amer: 35 mL/min/{1.73_m2} — ABNORMAL LOW (ref >59)
GFR, EST AFRICAN AMERICAN: 35 mL/min — AB (ref >60)
GFR, EST NON AFRICAN AMERICAN: 30 mL/min/{1.73_m2} — AB (ref >59)
GFR, EST NON AFRICAN AMERICAN: 30 mL/min — AB (ref >60)
Glucose, Bld: 128 mg/dL — ABNORMAL HIGH (ref 65–99)
Glucose: 119 mg/dL — ABNORMAL HIGH (ref 65–99)
Potassium: 5.7 mmol/L — ABNORMAL HIGH (ref 3.5–5.2)
Potassium: 4.1 mmol/L (ref 3.5–5.1)
SODIUM: 136 mmol/L (ref 134–144)
SODIUM: 132 mmol/L — AB (ref 135–145)

## 2017-10-02 NOTE — Progress Notes (Signed)
Cardiology Office Note Date:  10/03/2017  Patient ID:  Manuel, Rosales 11/04/1965, MRN [id number] PCP:  [facility], [facility]  Cardiologist:  Dr. Kirke Corin, MD    Chief Complaint: Follow up  History of Present Illness: Manuel Rosales is a 52 y.o. male with history of CAD s/p NSTEMI in 06/2017 in the setting of an abdominal abscess, chronic systolic CHF, pulmonary HTN, HIV, ARF, HTN, and tobacco abuse who presents for follow up chronic systolic CHF.   He was admitted in 06/2017 with SOB, orthopnea, and abdominal swelling with poorly controlled hypertension. CT abdomen and pelvis showed fluid collection involving the right psoas/ileus muscles. His troponin peaked at 1.87. Echo 06/09/17, showed an EF of 20-25% , diffuse hypokinesis, mild to moderate tricuspid regurgitation, and mild pulmonary hypertension. Cardiac catheterization was not performed due to active infection and acute renal failure. He underwent CT-guided drainage of the abscess and was treated with antibiotics. He was seen in the ED on 06/29/17 with worsening SOB. Troponin was mildly elevated a 0.04 and not trended. CBC showed WBC 2.4, HGB 12.9, PLT 188 with platelet clumps noted. BUN/SCr 27/1.06, K+ 3.9, albumin 2.9. CTA chest negative for PE, though there was concern for bilateral upper lobe PNA with trace bilateral pleural effusions. EKG showed NSR, 85 bpm, right axis deviation, diffuse TWI. He was discharged with outpatient follow up and was given azithromycin along with amoxicillin in the ED. He was seen by Dr. Kirke Corin on 07/01/17 and noted worsening orthopnea and PND. His weight had increased from 146-->160 pounds. He was felt to be volume overloaded and Lasix 20 mg once daily was added to his medication regimen along with increasing his Coreg to 12.5 mg bid. He was continued on Entresto. He was seen in the [facility] on 07/18/17 with a weight up to 166 pounds (up 14 pounds from prior CHF visit) and had a BP of  147/114. His Lasix was changed to torsemide 20 mg daily (though what was sent in was torsemide 40 mg bid) and he was advised to take metolazone 2.5 mg daily x 3 days. Labs checked that day, 07/18/17, showed a BUN/SCr 22/1.47, K+ 4.5. In follow up with the [facility] on 07/23/17 his weight was noted to be 150 pounds, BP 92/55. His breathing had improved, though he noted positional dizziness. Follow up labs checked 07/23/17, at outside office, showed worsening renal function with a BUN/SCr 56/3.58 (basline SCr 1-1.4), CO2 of 34. Torsemide was decreased from 40 mg bid to 40 mg daily. Recheck bmet at the CHF Clinic on 12/21 showed BUN/SCR 49/2.48, K+ 4.4. He was continued on torsemide 40 mg daily. Seen by the [facility] on 08/20/2017 with a weight of 160 pounds, BP 110/68. No changes were made. Labs from 08/20/2017 showed a BUN/SCr 30/2.39, K+ 4.6 with hemolysis noted. I saw him on 08/25/2017 for follow up of his CHF and he was doing well at that time. He was noted to conitnue to eat foods high in salt and was drinking > 2 L of fluids daily. He reported compliance with medications. His weight log showed his weight had been slowly trending up over the month of January from 147-->156 pounds. Patient felt like this was secondary to food intake. Recheck bmet on 08/25/17, showed a BUN/SCr 30/2.06, CO2 28, K+ 4.8. He was continued on torsemide 40 mg daily with an extra 20 mg prn for weight gain/SOB. He was also referred to nephrology given worsening renal function.  In follow up on 09/02/17, he was doing well.  Weights at home had been stable in the mid 150s. He continued to watch his sodium intake. Drinking approximately 2 L of fluids daily. He reported compliance with medications. Labs on 1/29 showed SCr 2.36 and K+ 5.7. His torsemide was decreased to 20 mg daily and his Sherryll Burger was held. Repeat bmet on 1/30 showed SCr 2.34 and K+ 4.1. He was continued on torsemide 20 mg daily with 20 mg prn for  SOB/swelling/weight gain.  He comes in doing well today.  Weight at home has been in the upper 150s to low 160s.  He has not had any days with a weight gain of greater than 2.5 pounds overnight and has not had any 5 pound weight gains in a 1 week time span.  Reports a vigorous appetite and feels like this is why his weight has slowly been trending up.  He is watching his salt intake and does not apply salt to any food.  He is drinking less than 2 L of fluids daily.  No abdominal distention, lower extremity swelling, early satiety, cough, or PND.  He has stable 2 pillow orthopnea.  No dizziness, presyncope, syncope, palpitations, shortness of breath, or chest pain.  He walks to and from the store daily which is approximately 1/4 mile each way without issues.  He continues to be pleased with his progress.  Unfortunately, it does appear he did not go to his nephrology appointment.  He reports compliance with Coreg 12.5 mg twice daily, Entresto 24/26 mg twice daily, and torsemide 20 mg daily.  He has not needed any of the as needed 20 mg doses of torsemide.   Past Medical History:  Diagnosis Date  . Chronic systolic CHF (congestive heart failure) (HCC) 06/2017   a. TTE 11/18: EF of 20-25% , difuse hypokinesis, mild to moderate tricuspid regurgitation, and mild pulmonary hypertension  . Coronary artery disease 06/2017   Non-ST elevation myocardial infarction.  Treated medically with no cardiac cath done due to active infection and renal failure.  . Dermatitis   . HIV (human immunodeficiency virus infection) (HCC)   . Hypertension   . Pulmonary hypertension (HCC)    a. TTE 11/18: EF of 20-25% , difuse hypokinesis, mild to moderate tricuspid regurgitation, and mild pulmonary hypertension w/ PASP 40 mmHg    Past Surgical History:  Procedure Laterality Date  . APPENDECTOMY    . IR RADIOLOGIST EVAL & MGMT  06/19/2017    Current Meds  Medication Sig  . aspirin EC 81 MG EC tablet Take 1 tablet (81 mg  total) daily by mouth.  Marland Kitchen atorvastatin (LIPITOR) 40 MG tablet Take 1 tablet (40 mg total) by mouth daily at 6 PM.  . bictegravir-emtricitabine-tenofovir AF (BIKTARVY) 50-200-25 MG TABS tablet Take 1 tablet by mouth daily.  . carvedilol (COREG) 12.5 MG tablet Take 1 tablet (12.5 mg total) by mouth 2 (two) times daily with a meal.  . nitroGLYCERIN (NITROSTAT) 0.4 MG SL tablet Place 1 tablet (0.4 mg total) every 5 (five) minutes as needed under the tongue for chest pain.  . sacubitril-valsartan (ENTRESTO) 24-26 MG Take 1 tablet by mouth 2 (two) times daily.  Marland Kitchen sulfamethoxazole-trimethoprim (BACTRIM DS,SEPTRA DS) 800-160 MG tablet Take 1 tablet by mouth daily.   Marland Kitchen torsemide (DEMADEX) 20 MG tablet Take 20 mg by mouth daily. May take 1 additional tablet (20 mg) as needed for shortness of breath/weight gain of 3 lbs.    Allergies:  Patient has no known allergies.   Social History:  The patient  reports that he quit smoking about 3 months ago. His smoking use included cigarettes. He smoked 0.50 packs per day. he has never used smokeless tobacco. He reports that he uses drugs. Drug: Marijuana. He reports that he does not drink alcohol.   Family History:  The patient's family history includes Cerebral aneurysm in his brother; Diabetes in his mother; Heart attack in his father; Hypertension in his mother.  ROS:   Review of Systems  Constitutional: Positive for malaise/fatigue. Negative for chills, diaphoresis, fever and weight loss.  HENT: Positive for congestion.   Eyes: Negative for discharge and redness.  Respiratory: Negative for cough, hemoptysis, sputum production, shortness of breath and wheezing.   Cardiovascular: Negative for chest pain, palpitations, orthopnea, claudication, leg swelling and PND.  Gastrointestinal: Negative for abdominal pain, blood in stool, heartburn, melena, nausea and vomiting.  Genitourinary: Negative for hematuria.  Musculoskeletal: Negative for falls and myalgias.    Skin: Negative for rash.  Neurological: Negative for dizziness, tingling, tremors, sensory change, speech change, focal weakness, loss of consciousness and weakness.  Endo/Heme/Allergies: Does not bruise/bleed easily.  Psychiatric/Behavioral: Negative for substance abuse. The patient is not nervous/anxious.   All other systems reviewed and are negative.    PHYSICAL EXAM:  VS:  BP 94/68 (BP Location: Left Arm, Patient Position: Sitting, Cuff Size: Normal)   Pulse 82   Ht 6\' 1"  (1.854 m)   Wt 164 lb 8 oz (74.6 kg)   BMI 21.70 kg/m  BMI: Body mass index is 21.7 kg/m.  Physical Exam  Constitutional: He is oriented to person, place, and time. He appears well-developed and well-nourished.  HENT:  Head: Normocephalic and atraumatic.  Eyes: Right eye exhibits no discharge. Left eye exhibits no discharge.  Neck: Normal range of motion. No JVD present.  Cardiovascular: Normal rate, regular rhythm, S1 normal, S2 normal and normal heart sounds. Exam reveals no distant heart sounds, no friction rub, no midsystolic click and no opening snap.  No murmur heard. Pulses:      Posterior tibial pulses are 2+ on the right side, and 2+ on the left side.  Pulmonary/Chest: Effort normal and breath sounds normal. No respiratory distress. He has no decreased breath sounds. He has no wheezes. He has no rales. He exhibits no tenderness.  Abdominal: Soft. He exhibits no distension. There is no tenderness.  Musculoskeletal: He exhibits no edema.  Neurological: He is alert and oriented to person, place, and time.  Skin: Skin is warm and dry. No cyanosis. Nails show no clubbing.  Psychiatric: He has a normal mood and affect. His speech is normal and behavior is normal. Judgment and thought content normal.     EKG:  Was ordered and interpreted by me today. Shows NSR, 82 bpm, no acute ST-T changes  Recent Labs: 06/08/2017: B Natriuretic Peptide 4,154.0 06/11/2017: Magnesium 2.0 06/29/2017: ALT 23; Hemoglobin  12.9; Platelets 188 09/03/2017: BUN 32; Creatinine, Ser 2.34; Potassium 4.1; Sodium 132  06/09/2017: Cholesterol 123; HDL 21; LDL Cholesterol 83; Total CHOL/HDL Ratio 5.9; Triglycerides 94; VLDL 19   CrCl cannot be calculated (Patient's most recent lab result is older than the maximum 21 days allowed.).   Wt Readings from Last 3 Encounters:  10/03/17 164 lb 8 oz (74.6 kg)  09/02/17 161 lb (73 kg)  08/25/17 158 lb 8 oz (71.9 kg)     Other studies reviewed: Additional studies/records reviewed today include: summarized above  ASSESSMENT AND  PLAN:  1. Chronic systolic CHF secondary to presumed ischemic cardiomyopathy: He does not appear grossly volume overloaded at this time.  Weight is up another 3 pounds from his office visit on 09/02/17 which is felt to be secondary to increased p.o. intake and not fluid retention as there is no lower extremity swelling, abdominal distention, crackles on exam, or elevated JVD.  He is drinking just under 2 L of fluids daily and eating a diet low in sodium without adding salt to his food.  He continues to take torsemide 20 mg daily.  He has not needed an additional as needed torsemide since he was last seen.  His blood pressure is soft today at 94/68.  We will decrease his carvedilol to 6.25 mg twice daily.  Continue Entresto 24/26 mg twice daily as well as torsemide 20 mg daily.  He is not on spironolactone given his CKD as detailed below.  Ultimately, he continues to require an ischemic evaluation to further evaluate his cardiomyopathy, preferably with cardiac catheterization given his cardiomyopathy.  However, this has been delayed due to his acute renal failure and infection while admitted and more recently due to AKI in the setting of a possible overdiuresis as an outpatient by outside clinic.  Check BMP today and if renal function is improved we should consider outpatient diagnostic cardiac catheterization.  If renal function remains elevated as it currently has  been I would recommend we at least proceed with a Lexiscan Myoview to evaluate for high risk ischemia and could potentially further evaluate with cardiac catheterization if needed based on these results.  He will continue daily weights and will take an extra 20 mg of torsemide for weight gain greater than 3 pounds overnight or 5 pounds in a 1 week time span.  CHF education was discussed in detail.  2. CAD: No symptoms concerning for chest pain at this time.  Has been medically managed as detailed above.  Continue current treatment with aspirin, Coreg, and Lipitor.  Plan for ischemic evaluation this upcoming week as detailed above pending his renal function.  3. Acute on CKD stage III: Check bmet.  Patient did not show for his nephrology appointment.  We will plan to re-refer him for this appointment once we have his BMP obtained from today.  For now we will continue Entresto as detailed above though if there is significant worsening of renal function or hyperkalemia we may need to discontinue Entresto at that time.  Would continue to avoid metolazone given his history of AKI.  4. Essential hypertension: Blood pressure soft today at 94/68.  Carvedilol decreased to 6.25 mg twice daily as detailed above.  Continue Entresto 24/26 mg twice daily.  5. Hyperlipidemia: Continue Lipitor 40 mg daily.  Check lipid and liver panel today.  Disposition: F/u with Dr. Kirke CorinArida in 1 month.  Current medicines are reviewed at length with the patient today.  The patient did not have any concerns regarding medicines.  Signed, Eula Listenyan Adolph Clutter, PA-C 10/03/2017 1:42 PM     [facility] [address] [place], [place] [address] [phone]

## 2017-10-03 ENCOUNTER — Encounter: Payer: Self-pay | Admitting: Physician Assistant

## 2017-10-03 ENCOUNTER — Ambulatory Visit (INDEPENDENT_AMBULATORY_CARE_PROVIDER_SITE_OTHER): Payer: Medicaid Other | Admitting: Physician Assistant

## 2017-10-03 VITALS — BP 94/68 | HR 82 | Ht 73.0 in | Wt 164.5 lb

## 2017-10-03 DIAGNOSIS — I255 Ischemic cardiomyopathy: Secondary | ICD-10-CM | POA: Diagnosis not present

## 2017-10-03 DIAGNOSIS — E785 Hyperlipidemia, unspecified: Secondary | ICD-10-CM

## 2017-10-03 DIAGNOSIS — I10 Essential (primary) hypertension: Secondary | ICD-10-CM

## 2017-10-03 DIAGNOSIS — Z79899 Other long term (current) drug therapy: Secondary | ICD-10-CM | POA: Diagnosis not present

## 2017-10-03 DIAGNOSIS — I2510 Atherosclerotic heart disease of native coronary artery without angina pectoris: Secondary | ICD-10-CM

## 2017-10-03 DIAGNOSIS — N179 Acute kidney failure, unspecified: Secondary | ICD-10-CM | POA: Diagnosis not present

## 2017-10-03 DIAGNOSIS — I5022 Chronic systolic (congestive) heart failure: Secondary | ICD-10-CM | POA: Diagnosis not present

## 2017-10-03 DIAGNOSIS — N183 Chronic kidney disease, stage 3 (moderate): Secondary | ICD-10-CM | POA: Diagnosis not present

## 2017-10-03 MED ORDER — CARVEDILOL 6.25 MG PO TABS: 6.2500 mg | ORAL_TABLET | Freq: Two times a day (BID) | ORAL | 3 refills | Status: DC

## 2017-10-03 NOTE — Patient Instructions (Signed)
Medication Instructions:  Your physician has recommended you make the following change in your medication:  DECREASE coreg to 6.25mg  twice daily   Labwork: BMET today.   Testing/Procedures: none  Follow-Up: Your physician recommends that you schedule a follow-up appointment in: 1 month with Dr. Kirke CorinArida.    Any Other Special Instructions Will Be Listed Below (If Applicable). You will receive a call next week to determine if you will need a myoview or cardiac catheterization.       If you need a refill on your cardiac medications before your next appointment, please call your pharmacy.

## 2017-10-04 LAB — BASIC METABOLIC PANEL
BUN/Creatinine Ratio: 12 (ref 9–20)
BUN: 27 mg/dL — ABNORMAL HIGH (ref 6–24)
CO2: 23 mmol/L (ref 20–29)
Calcium: 10.2 mg/dL (ref 8.7–10.2)
Chloride: 98 mmol/L (ref 96–106)
Creatinine, Ser: 2.18 mg/dL — ABNORMAL HIGH (ref 0.76–1.27)
GFR calc Af Amer: 39 mL/min/{1.73_m2} — ABNORMAL LOW (ref >59)
GFR, EST NON AFRICAN AMERICAN: 33 mL/min/{1.73_m2} — AB (ref >59)
Glucose: 116 mg/dL — ABNORMAL HIGH (ref 65–99)
POTASSIUM: 5.3 mmol/L — AB (ref 3.5–5.2)
SODIUM: 138 mmol/L (ref 134–144)

## 2017-10-06 ENCOUNTER — Other Ambulatory Visit: Payer: Self-pay

## 2017-10-06 DIAGNOSIS — I38 Endocarditis, valve unspecified: Secondary | ICD-10-CM

## 2017-10-06 DIAGNOSIS — I5022 Chronic systolic (congestive) heart failure: Secondary | ICD-10-CM

## 2017-10-06 DIAGNOSIS — I252 Old myocardial infarction: Secondary | ICD-10-CM

## 2017-10-31 ENCOUNTER — Other Ambulatory Visit: Payer: Self-pay

## 2017-10-31 DIAGNOSIS — I5022 Chronic systolic (congestive) heart failure: Secondary | ICD-10-CM

## 2017-11-04 ENCOUNTER — Ambulatory Visit: Payer: Medicaid Other | Admitting: Cardiovascular Disease

## 2017-11-11 ENCOUNTER — Telehealth: Payer: Self-pay | Admitting: Physician Assistant

## 2017-11-11 NOTE — Telephone Encounter (Signed)
Notes recorded by Sondra Bargesunn, Ryan M, PA-C on 10/06/2017 at 7:14 AM EST "Please call the patient. Renal function at his approximate baseline. Potassium is mildly elevated. Please continue to limit seasonings that are high in potassium.  Please schedule Lexiscan Myoview to evaluate for high-risk ischemia.  Dx: acute systolic CHF/NSTEMI"  See results note for further documentation.  Patient scheduled April 10 lexi myoview. Per [name], PA-C, patient needs BMET on April 8 (prior to testing). Instructions were mailed to patient's home address and verbally reviewed with him on 10/31/17.  I called patient today as he has not had labs. Patient states he is unsure if he can get a ride for labs today or testing tomorrow. He has asked to reschedule. I offered to do this now but he is "in the middle of something" and prefers to call back.  Lexi myoview cancelled. Abby notified.

## 2017-11-12 ENCOUNTER — Ambulatory Visit: Payer: Medicaid Other

## 2017-11-21 NOTE — Telephone Encounter (Signed)
Contacted patient to reschedule lexi myoview. Patient states he will need to find a ride and prefers to call back when he has this in place.  Since he has not yet had this test, no follow up visit has been scheduled.  Routed to Albertson'syan Dunn, PA-C to make aware.

## 2018-02-17 ENCOUNTER — Telehealth: Payer: Self-pay

## 2018-02-17 ENCOUNTER — Encounter: Payer: Self-pay | Admitting: Family

## 2018-02-17 ENCOUNTER — Ambulatory Visit: Payer: Medicaid Other | Attending: Family | Admitting: Family

## 2018-02-17 VITALS — BP 110/81 | HR 85 | Resp 18 | Ht 73.0 in | Wt 186.4 lb

## 2018-02-17 DIAGNOSIS — I10 Essential (primary) hypertension: Secondary | ICD-10-CM

## 2018-02-17 DIAGNOSIS — I2510 Atherosclerotic heart disease of native coronary artery without angina pectoris: Secondary | ICD-10-CM | POA: Diagnosis not present

## 2018-02-17 DIAGNOSIS — I252 Old myocardial infarction: Secondary | ICD-10-CM | POA: Insufficient documentation

## 2018-02-17 DIAGNOSIS — Z79899 Other long term (current) drug therapy: Secondary | ICD-10-CM | POA: Diagnosis not present

## 2018-02-17 DIAGNOSIS — B20 Human immunodeficiency virus [HIV] disease: Secondary | ICD-10-CM | POA: Insufficient documentation

## 2018-02-17 DIAGNOSIS — I5022 Chronic systolic (congestive) heart failure: Secondary | ICD-10-CM

## 2018-02-17 DIAGNOSIS — I272 Pulmonary hypertension, unspecified: Secondary | ICD-10-CM | POA: Insufficient documentation

## 2018-02-17 DIAGNOSIS — Z7982 Long term (current) use of aspirin: Secondary | ICD-10-CM | POA: Diagnosis not present

## 2018-02-17 DIAGNOSIS — F17210 Nicotine dependence, cigarettes, uncomplicated: Secondary | ICD-10-CM | POA: Diagnosis not present

## 2018-02-17 DIAGNOSIS — I110 Hypertensive heart disease with heart failure: Secondary | ICD-10-CM | POA: Diagnosis not present

## 2018-02-17 DIAGNOSIS — Z8249 Family history of ischemic heart disease and other diseases of the circulatory system: Secondary | ICD-10-CM | POA: Insufficient documentation

## 2018-02-17 DIAGNOSIS — Z720 Tobacco use: Secondary | ICD-10-CM | POA: Insufficient documentation

## 2018-02-17 NOTE — Progress Notes (Signed)
Patient ID: Manuel Rosales, male    DOB: 06/18/1966, 52 y.o.   MRN: [id number]  HPI  Manuel Rosales is a 52 y/o male with a history of HTN, HIV, psoas abscess, recent tobacco/alcohol use and chronic heart failure.   Echo report from 06/09/17 reviewed and shows an EF of 20-25% along with mild/mod TR and mildly elevated PA pressure of 40 mm Hg.   Has not been in the ED or admitted in the last 6 months.    He presents today for a follow-up visit with a chief complaint of minimal fatigue upon moderate exertion. He says that this has been present for several years. He has associated gradual weight gain due to increased appetite. He denies any difficulty sleeping, abdominal distention, palpitations, pedal edema, chest pain, shortness of breath or dizziness. Cardiology wanted to schedule lexiscan but patient said transportation was an issue. Also did not follow-up with nephrology as he thought he was told that his kidneys were better so he didn't go.   Past Medical History:  Diagnosis Date  . Chronic systolic CHF (congestive heart failure) (HCC) 06/2017   a. TTE 11/18: EF of 20-25% , difuse hypokinesis, mild to moderate tricuspid regurgitation, and mild pulmonary hypertension  . Coronary artery disease 06/2017   Non-ST elevation myocardial infarction.  Treated medically with no cardiac cath done due to active infection and renal failure.  . Dermatitis   . HIV (human immunodeficiency virus infection) (HCC)   . Hypertension   . Pulmonary hypertension (HCC)    a. TTE 11/18: EF of 20-25% , difuse hypokinesis, mild to moderate tricuspid regurgitation, and mild pulmonary hypertension w/ PASP 40 mmHg   Past Surgical History:  Procedure Laterality Date  . APPENDECTOMY    . IR RADIOLOGIST EVAL & MGMT  06/19/2017   Family History  Problem Relation Age of Onset  . Cerebral aneurysm Brother        died  . Hypertension Mother   . Diabetes Mother   . Heart attack Father    Social History   Tobacco Use   . Smoking status: Former Smoker    Packs/day: 0.50    Types: Cigarettes    Last attempt to quit: 06/11/2017    Years since quitting: 0.6  . Smokeless tobacco: Never Used  Substance Use Topics  . Alcohol use: No    Frequency: Never   No Known Allergies  Past Medical History:  Diagnosis Date  . Chronic systolic CHF (congestive heart failure) (HCC) 06/2017   a. TTE 11/18: EF of 20-25% , difuse hypokinesis, mild to moderate tricuspid regurgitation, and mild pulmonary hypertension  . Coronary artery disease 06/2017   Non-ST elevation myocardial infarction.  Treated medically with no cardiac cath done due to active infection and renal failure.  . Dermatitis   . HIV (human immunodeficiency virus infection) (HCC)   . Hypertension   . Pulmonary hypertension (HCC)    a. TTE 11/18: EF of 20-25% , difuse hypokinesis, mild to moderate tricuspid regurgitation, and mild pulmonary hypertension w/ PASP 40 mmHg   Past Surgical History:  Procedure Laterality Date  . APPENDECTOMY    . IR RADIOLOGIST EVAL & MGMT  06/19/2017   Family History  Problem Relation Age of Onset  . Cerebral aneurysm Brother        died  . Hypertension Mother   . Diabetes Mother   . Heart attack Father    Social History   Tobacco Use  . Smoking status: Former Smoker  Packs/day: 0.50    Types: Cigarettes    Last attempt to quit: 06/11/2017    Years since quitting: 0.6  . Smokeless tobacco: Never Used  Substance Use Topics  . Alcohol use: No    Frequency: Never   No Known Allergies  Prior to Admission medications   Medication Sig Start Date End Date Taking? Authorizing Provider  aspirin EC 81 MG EC tablet Take 1 tablet (81 mg total) daily by mouth. 06/12/17  Yes Enedina Finner, MD  atorvastatin (LIPITOR) 40 MG tablet Take 1 tablet (40 mg total) by mouth daily at 6 PM. 09/02/17  Yes Dunn, Raymon Mutton, PA-C  bictegravir-emtricitabine-tenofovir AF (BIKTARVY) 50-200-25 MG TABS tablet Take 1 tablet by mouth daily.   Yes  [provider]  carvedilol (COREG) 6.25 MG tablet Take 1 tablet (6.25 mg total) by mouth 2 (two) times daily. 10/03/17  Yes Dunn, Raymon Mutton, PA-C  nitroGLYCERIN (NITROSTAT) 0.4 MG SL tablet Place 1 tablet (0.4 mg total) every 5 (five) minutes as needed under the tongue for chest pain. 06/12/17  Yes Enedina Finner, MD  sacubitril-valsartan (ENTRESTO) 24-26 MG Take 1 tablet by mouth 2 (two) times daily. 09/02/17  Yes Dunn, Raymon Mutton, PA-C  torsemide (DEMADEX) 20 MG tablet Take 20 mg by mouth daily. May take 1 additional tablet (20 mg) as needed for shortness of breath/weight gain of 3 lbs.   Yes [provider]   Review of Systems  Constitutional: Positive for fatigue. Negative for appetite change.  HENT: Negative for congestion, postnasal drip and sore throat.   Eyes: Negative.   Respiratory: Negative for chest tightness and shortness of breath.   Cardiovascular: Negative for chest pain, palpitations and leg swelling.  Gastrointestinal: Negative for abdominal distention and abdominal pain.  Endocrine: Negative.   Genitourinary: Negative.   Musculoskeletal: Negative for back pain and neck pain.  Skin: Negative.   Allergic/Immunologic: Negative.   Neurological: Negative for dizziness and light-headedness.  Hematological: Negative for adenopathy. Does not bruise/bleed easily.  Psychiatric/Behavioral: Negative for dysphoric mood and sleep disturbance (sleeping more during the day). The patient is not nervous/anxious.    Vitals:   02/17/18 1009  BP: 110/81  Pulse: 85  Resp: 18  SpO2: 100%  Weight: 186 lb 6 oz (84.5 kg)  Height: 6\' 1"  (1.854 m)   Wt Readings from Last 3 Encounters:  02/17/18 186 lb 6 oz (84.5 kg)  10/03/17 164 lb 8 oz (74.6 kg)  09/02/17 161 lb (73 kg)   Lab Results  Component Value Date   CREATININE 2.18 (H) 10/03/2017   CREATININE 2.34 (H) 09/03/2017   CREATININE 2.36 (H) 09/02/2017   Physical Exam  Constitutional: He is oriented to person, place, and  time. He appears well-developed and well-nourished.  HENT:  Head: Normocephalic and atraumatic.  Neck: Normal range of motion. Neck supple. No JVD present.  Cardiovascular: Normal rate and regular rhythm.  Pulmonary/Chest: Effort normal. He has no wheezes. He has no rales.  Abdominal: He exhibits no distension. There is no tenderness.  Musculoskeletal: He exhibits no edema or tenderness.  Neurological: He is alert and oriented to person, place, and time.  Skin: Skin is warm and dry.  Psychiatric: He has a normal mood and affect. His behavior is normal. Thought content normal.  Nursing note and vitals reviewed.  Assessment & Plan:  1: Chronic heart failure with reduced ejection fraction- - NYHA class II - euvolemic today - weighing daily. Instructed to call for an overnight weight gain of >  2 pounds or a weekly weight gain of >5 pounds - weight up 26 pounds since he was last here ~ 6 months ago - not adding salt and is using Mrs Sharilyn SitesDash for seasoning. Discussed the importance of closely following a 2000mg  sodium diet  - saw cardiology (Dunn) 10/03/17 - lexiscan rescheduled and emphasized the importance of getting the test completed - BMP from 10/03/17 reviewed and showed sodium 138, potassium 5.3 and GFR 39  - did not go to nephrology appointment so will ask CMA to call and get it rescheduled - drinking ~56 ounces of fluid daily - PharmD reconciled medications with the patient  2: HTN- - BP looks good today although on the low side - doubtful that his BP can tolerate increase of entresto - [facility]  3: Tobacco use- - smoking 1-2 cigarettes daily - complete cessation discussed for 3 minutes with patient  Visit was done in the presence of patient's girlfriend Manuel Rosales(Michelle Cuellar) with patient's approval.   Medication bottles were reviewed.

## 2018-02-17 NOTE — Patient Instructions (Signed)
Continue weighing daily and call for an overnight weight gain of > 2 pounds or a weekly weight gain of >5 pounds. 

## 2018-02-17 NOTE — Telephone Encounter (Signed)
Contacted patient to inform of upcoming appointments.  Patient is scheduled for outpatient Lexiscan on Friday July 19th at 9:30am.  Patient was advised he is to be NPO 24 hours prior to scan, No caffeine or smoking as well. He may take his medication and drink water if he can tolerate medication with out food. Mr. Manuel Rosales verbalized understanding.   Patient was also advised of Nephrology appointment scheduled with [facility] on April 16, 2018 at 9:40 am.

## 2018-02-20 ENCOUNTER — Telehealth: Payer: Self-pay | Admitting: Physician Assistant

## 2018-02-20 ENCOUNTER — Ambulatory Visit: Payer: Medicaid Other

## 2018-02-20 NOTE — Telephone Encounter (Signed)
Pt no showed for his stress test today. This is the 3rd time.

## 2018-02-27 NOTE — Telephone Encounter (Signed)
Patient needs appointment with Dr Kirke CorinArida or APP prior to rescheduling of Lexiscan. Please call to schedule an appointment.

## 2018-02-27 NOTE — Telephone Encounter (Signed)
Thanks, patient will need to be seen in the office prior to rescheduling.

## 2018-02-27 NOTE — Telephone Encounter (Signed)
Alycia RossettiRyan,  This patient has been scheduled for a myoview 3 times since 10/03/17. 10/20/17- no showed 11/12/17- cx due to transportation issues 02/20/18- no showed  Just an FYI- I have not tried to call and reschedule him again.

## 2018-03-02 NOTE — Telephone Encounter (Signed)
Attempted to call patient and schedule appointment with Provider °Number not taking calls and the other line was Busy °  °Will try again at a later time °

## 2018-03-06 NOTE — Telephone Encounter (Signed)
No ans no vm   °

## 2018-03-11 NOTE — Telephone Encounter (Signed)
Attempted to call patient and schedule appointment with Provider Number not taking calls and the other line was Busy  Will try again at a later time

## 2018-03-19 NOTE — Telephone Encounter (Signed)
Attempted to call patient and schedule No answer, no vm

## 2018-03-20 ENCOUNTER — Encounter: Payer: Self-pay | Admitting: Cardiovascular Disease

## 2018-03-20 NOTE — Telephone Encounter (Signed)
Mailed letter to contact.  Removing from scheduling WQ

## 2018-05-04 ENCOUNTER — Telehealth: Payer: Self-pay | Admitting: *Deleted

## 2018-05-04 IMAGING — CT CT ABD-PELV W/ CM
3 of 11 series · 12 of 46 positions shown, 17 images · IV contrast (APPLIED)
Comparison: 06/19/2017 CT abdomen and pelvis, CXR 06/08/2017

CLINICAL DATA: Dyspnea times 30 hours. Recent procedure to drain
right iliopsoas abscess.

EXAM:
CT ANGIOGRAPHY CHEST
CT ABDOMEN AND PELVIS WITH CONTRAST
TECHNIQUE: Multidetector CT imaging of the chest was performed using the
standard protocol during bolus administration of intravenous
contrast. Multiplanar CT image reconstructions and MIPs were
obtained to evaluate the vascular anatomy. Multidetector CT imaging
of the abdomen and pelvis was performed using the standard protocol
during bolus administration of intravenous contrast.
CONTRAST:  75mL V7PEHH-SKX IOPAMIDOL (V7PEHH-SKX) INJECTION 76%

[Series 5: thins · axial · 0.67mm/px · z∈[-663,-395]mm · 8 of 327 slices shown]
[im 39/327  soft-tissue]
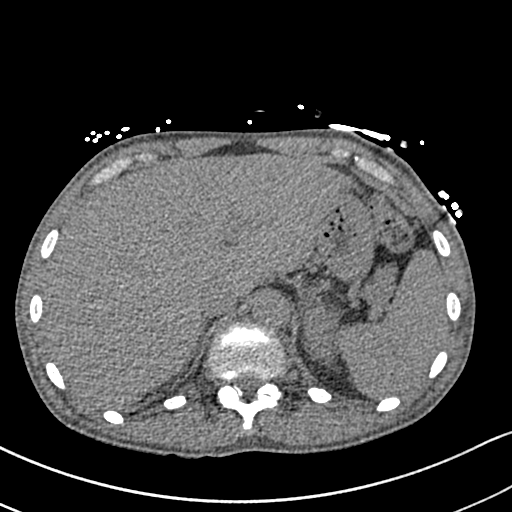
[im 77/327  soft-tissue]
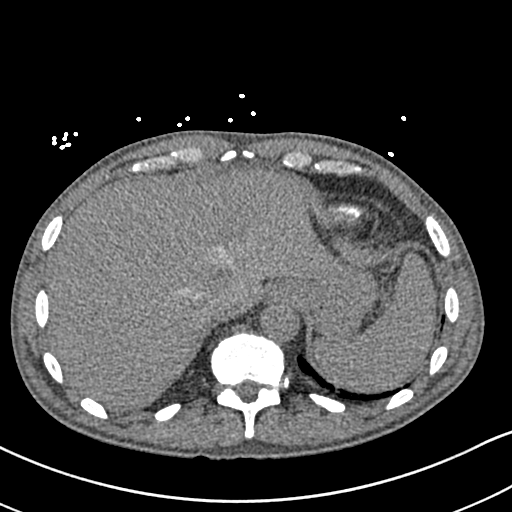
[im 116/327  soft-tissue]
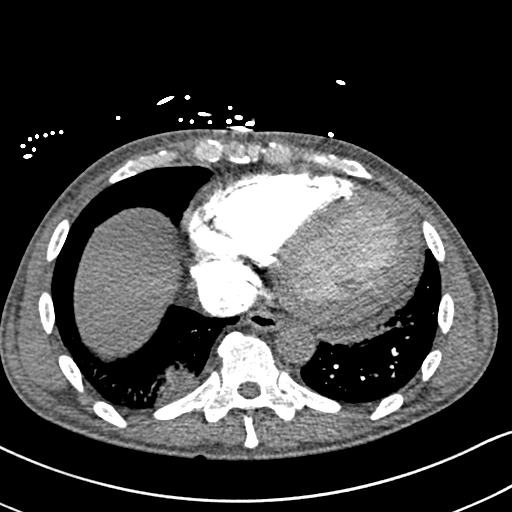
[im 154/327  soft-tissue]
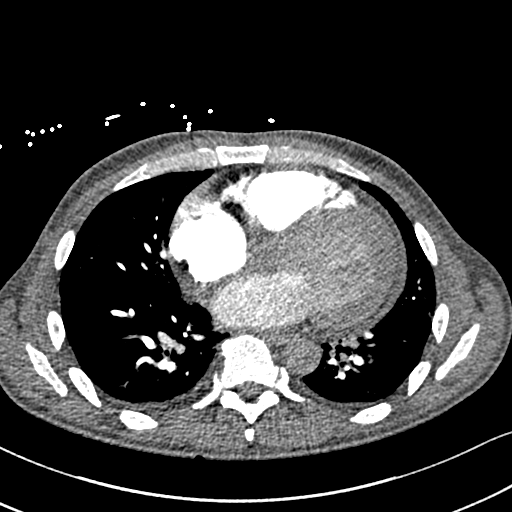
[im 192/327  soft-tissue]
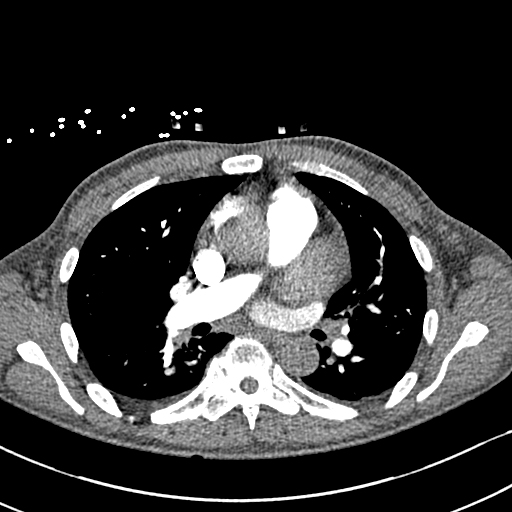
[im 231/327  soft-tissue]
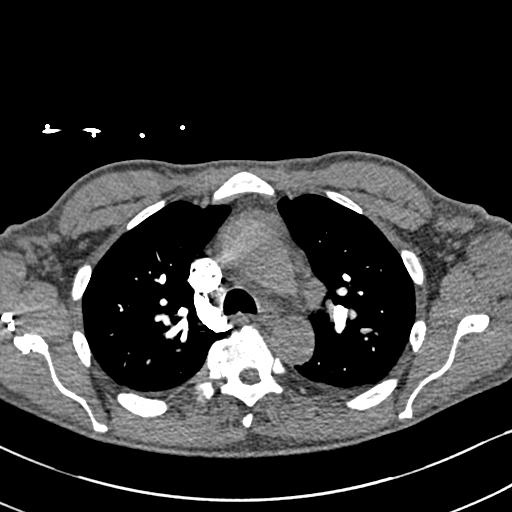
[im 269/327  soft-tissue]
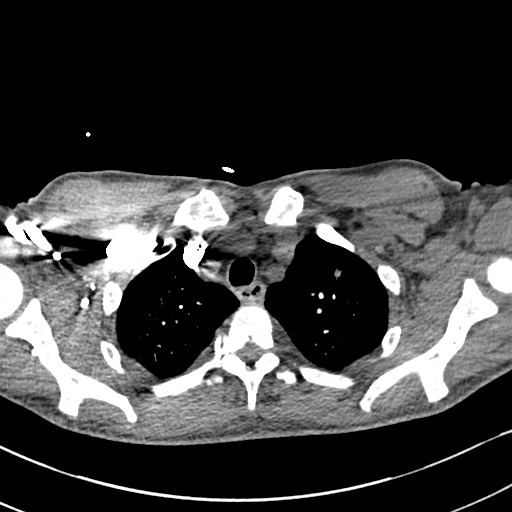
[im 307/327  soft-tissue]
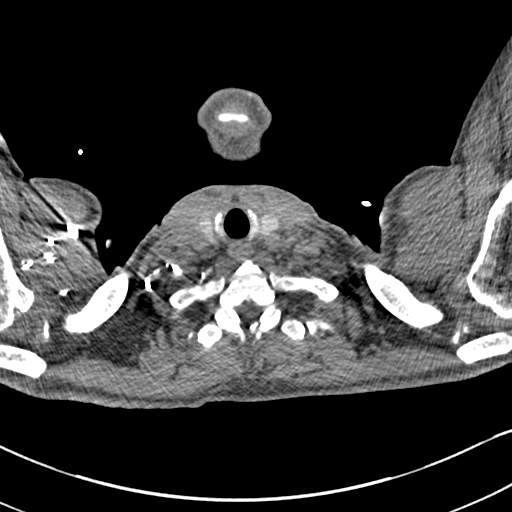

[Series 7: coronal mpr · coronal · 0.64mm/px · 1 of 115 slices shown, 2 images]
[im 58/115  soft-tissue]
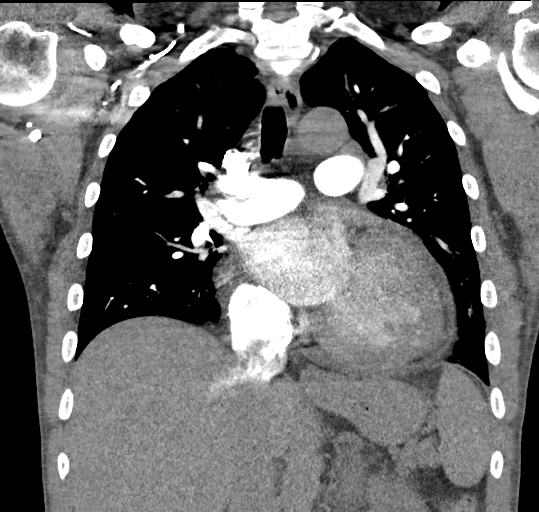
[im 58/115  bone]
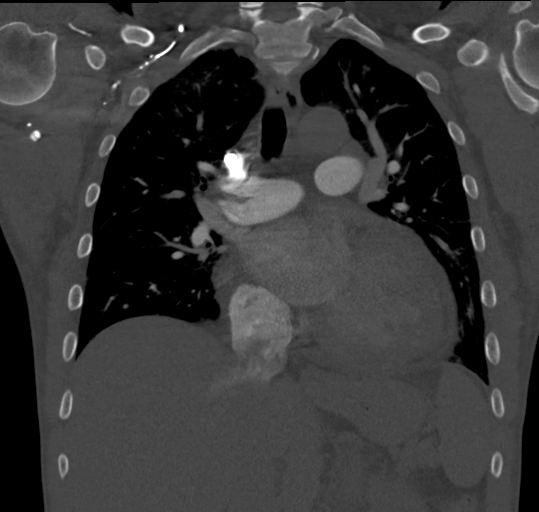

[Series 11: axial st · axial · 0.67mm/px · z∈[-899,-679]mm · 3 of 90 slices shown, 7 images]
[im 23/90  soft-tissue]
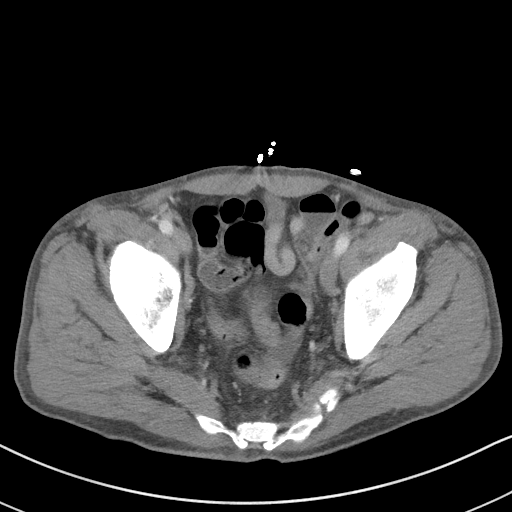
[im 23/90  lung]
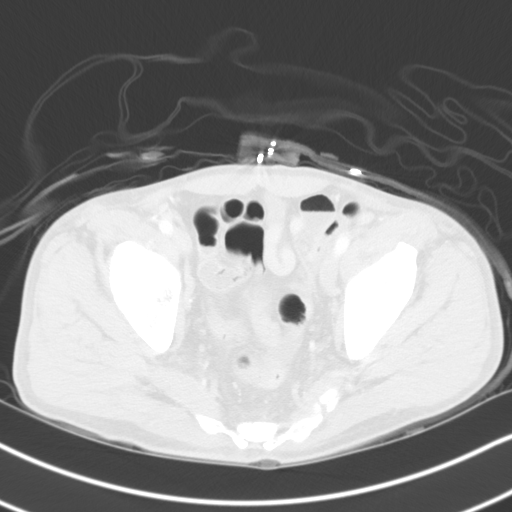
[im 23/90  bone]
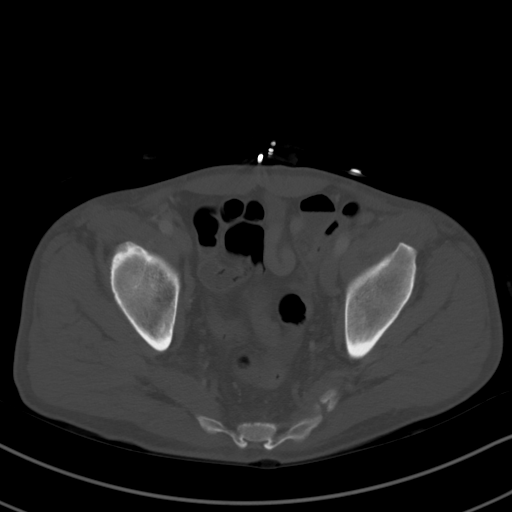
[im 45/90  soft-tissue]
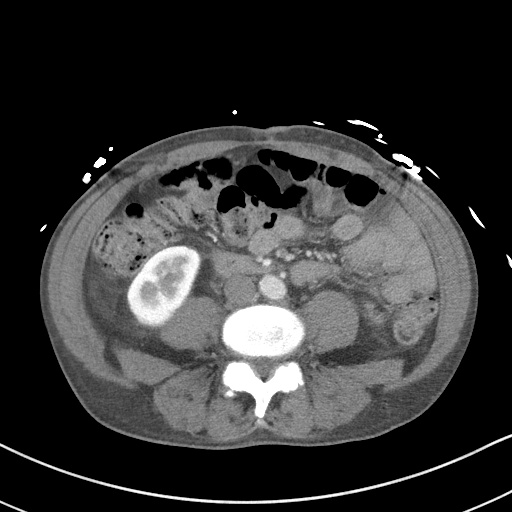
[im 45/90  lung]
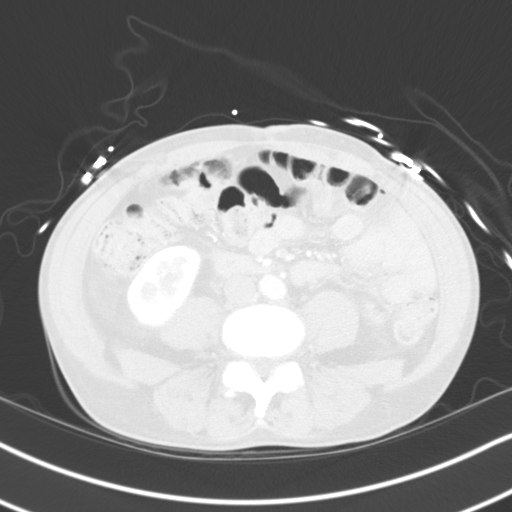
[im 67/90  soft-tissue]
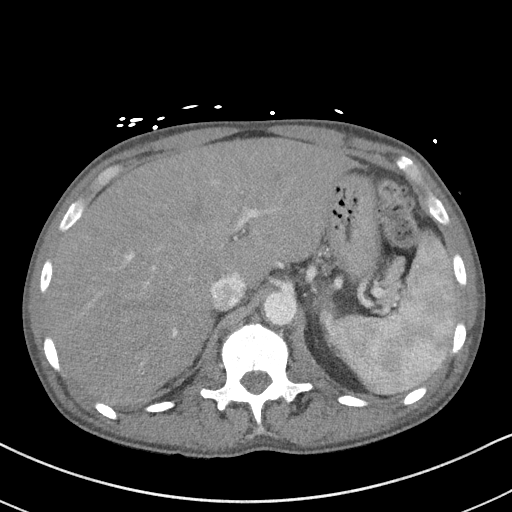
[im 67/90  lung]
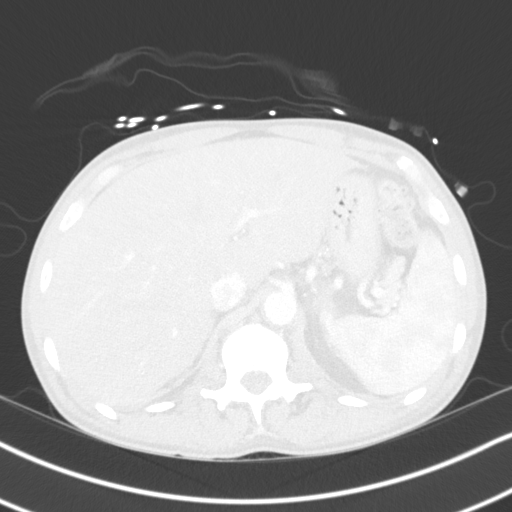

[12 of 46 positions shown; findings below may reference images not displayed]

FINDINGS: CTA CHEST FINDINGS

Cardiovascular: Cardiomegaly with small pericardial effusion
measuring up to 7 mm in thickness, stable in appearance.
Preferential opacification of the pulmonary arterial system without
evidence of acute pulmonary embolus to the segmental level. No
aortic aneurysm.

Mediastinum/Nodes: No enlarged mediastinal, hilar, or axillary lymph
nodes. Thyroid gland, trachea, and esophagus demonstrate no
significant findings. Normal branch pattern of the great vessels.
Small fat containing Bochdalek hernia on the right appear

Lungs/Pleura: Mild peribronchial thickening to the lower lobes.
Patchy bilateral upper lobe airspace opacities with bibasilar
dependent atelectasis and trace right greater than left pleural
effusions. Streaky atelectasis and/or scarring is seen at the left
lung base.

Musculoskeletal: Bridging right lateral osteophytes along the upper
thoracic spine. No acute osseous abnormality.

Review of the MIP images confirms the above findings.

CT ABDOMEN and PELVIS FINDINGS

Hepatobiliary: No focal liver abnormality is seen. No gallstones,
gallbladder wall thickening, or biliary dilatation.

Pancreas: Unremarkable. No pancreatic ductal dilatation or
surrounding inflammatory changes.

Spleen: Normal in size without focal abnormality.

Adrenals/Urinary Tract: Adrenal glands are unremarkable. Kidneys are
normal, without renal calculi, focal lesion, or hydronephrosis.
Bladder is unremarkable.

Stomach/Bowel: Stomach is within normal limits. Appendix is
surgically absent. No evidence of bowel wall thickening, distention,
or inflammatory changes.

Vascular/Lymphatic: Atherosclerotic nonaneurysmal abdominal aorta
with patent portal, splenic, hepatic and renal veins. Mild stable
inguinal lymphadenopathy.

Reproductive: Prostate is unremarkable.

Other: No abdominal wall hernia or free air. Trace free fluid in the
pelvis.

Musculoskeletal: Removal of percutaneous drain along previously
noted iliopsoas abscess. Residual right lower quadrant edema and
intramuscular edema is noted of the right psoas and iliacus muscles
without recurrent significant fluid collections.

Review of the MIP images confirms the above findings.
IMPRESSION: 1. No acute pulmonary embolus.
2. Bilateral upper lobe airspace opacities suspicious for pneumonia.
Follow-up to resolution is recommended after appropriate antibiotic
therapy.
3. Stable cardiomegaly with trace bilateral pleural effusions and
atelectasis. Mild peribronchial thickening that may reflect chronic
bronchitic change predominantly to the lower lobes.
4. Right lower quadrant percutaneous drainage catheter has been
removed from the iliopsoas abscess. Residual mild edema in the right
hemipelvis as above stated. No recurrence of abscess collection is
apparent.
5. Stable inguinal lymphadenopathy, likely reactive.
6. Fat containing right Bochdalek deck hernia is stable.

## 2018-05-04 NOTE — Telephone Encounter (Signed)
Lmov for patient to call and schedule appointment ° °

## 2018-05-04 NOTE — Telephone Encounter (Signed)
Please call patient and offer him appointment for next available.

## 2018-05-04 NOTE — Telephone Encounter (Signed)
Sondra Barges, PA-C  Stann Mainland, RN        Would not reschedule stress test at this time. Would recommend patient follow up in the office to discuss further prior to rescheduling anything.   Previous Messages    ----- Message -----  From: Stann Mainland, RN  Sent: 04/29/2018  1:55 PM EDT  To: Sondra Barges, PA-C  Subject: FW: No show for NM                Making you aware. Please advise on message below. Thanks!     ----- Message -----  From: Joline Maxcy  Sent: 04/29/2018  1:14 PM EDT  To: Mickie [facility] Triage  Subject: No show for NM                  Patient no show x 3 for stress test . Please advise if order can be removed or if we should attempt to reschedule.

## 2018-05-05 NOTE — Telephone Encounter (Signed)
Patient states he is doing stuff with his car and will call us back to schedule appointment   Will await call back

## 2018-05-14 ENCOUNTER — Encounter: Payer: Self-pay | Admitting: Cardiovascular Disease

## 2018-05-14 NOTE — Telephone Encounter (Signed)
Unable to lmov  Will send letter to patient

## 2018-05-20 NOTE — Telephone Encounter (Signed)
Routing to provider to make him aware.

## 2018-06-24 ENCOUNTER — Other Ambulatory Visit: Payer: Self-pay | Admitting: Family

## 2018-06-24 MED ORDER — ATORVASTATIN CALCIUM 40 MG PO TABS: 40.0000 mg | ORAL_TABLET | Freq: Every day | ORAL | 3 refills | Status: DC

## 2018-06-24 MED ORDER — NITROGLYCERIN 0.4 MG SL SUBL: 0.4000 mg | SUBLINGUAL_TABLET | SUBLINGUAL | 1 refills | Status: AC | PRN

## 2018-06-24 MED ORDER — TORSEMIDE 20 MG PO TABS: 20.0000 mg | ORAL_TABLET | Freq: Every day | ORAL | 3 refills | Status: DC

## 2018-06-24 MED ORDER — SACUBITRIL-VALSARTAN 24-26 MG PO TABS: 1.0000 | ORAL_TABLET | Freq: Two times a day (BID) | ORAL | 3 refills | Status: DC

## 2018-06-24 MED ORDER — CARVEDILOL 6.25 MG PO TABS: 6.2500 mg | ORAL_TABLET | Freq: Two times a day (BID) | ORAL | 3 refills | Status: DC

## 2018-06-24 MED ORDER — ASPIRIN 81 MG PO TBEC: 81.0000 mg | DELAYED_RELEASE_TABLET | Freq: Every day | ORAL | 3 refills | Status: AC

## 2018-08-11 NOTE — Progress Notes (Signed)
Patient ID: Manuel Rosales, male    DOB: [birth year], 53 y.o.   MRN: [id number]  HPI  Manuel Rosales is a 53 y/o male with a history of HTN, HIV, psoas abscess, current tobacco use and chronic heart failure.   Echo report from 06/09/17 reviewed and shows an EF of 20-25% along with mild/mod TR and mildly elevated PA pressure of 40 mm Hg.   Has not been in the ED or admitted in the last 6 months.    He presents today for a follow-up visit with a chief complaint of minimal fatigue upon moderate exertion. He describes this as chronic in nature having been present for several years. He doesn't have any associated symptoms and currently denies any difficulty sleeping, abdominal distention, palpitations, pedal edema, chest pain, shortness of breath,cough, dizziness or difficulty sleeping. Has had a gradual weight gain at home but denies any sudden overnight weight gain. Reports continuing to monitor his sodium intake. Had been out of entresto for a couple of days but samples were provided to him and he resumed it 2 days ago. His insurance is requiring an updated echo to continue paying for entresto.   Past Medical History:  Diagnosis Date  . Chronic systolic CHF (congestive heart failure) (HCC) 06/2017   a. TTE 11/18: EF of 20-25% , difuse hypokinesis, mild to moderate tricuspid regurgitation, and mild pulmonary hypertension  . Coronary artery disease 06/2017   Non-ST elevation myocardial infarction.  Treated medically with no cardiac cath done due to active infection and renal failure.  . Dermatitis   . HIV (human immunodeficiency virus infection) (HCC)   . Hypertension   . Pulmonary hypertension (HCC)    a. TTE 11/18: EF of 20-25% , difuse hypokinesis, mild to moderate tricuspid regurgitation, and mild pulmonary hypertension w/ PASP 40 mmHg   Past Surgical History:  Procedure Laterality Date  . APPENDECTOMY    . IR RADIOLOGIST EVAL & MGMT  06/19/2017   Family History  Problem Relation Age of  Onset  . Cerebral aneurysm Brother        died  . Hypertension Mother   . Diabetes Mother   . Heart attack Father    Social History   Tobacco Use  . Smoking status: Current Some Day Smoker    Packs/day: 0.50    Types: Cigarettes    Last attempt to quit: 06/11/2017    Years since quitting: 1.1  . Smokeless tobacco: Never Used  . Tobacco comment: 02/17/2018- Smokes 2 cigarettes a day  Substance Use Topics  . Alcohol use: Yes    Alcohol/week: 2.0 standard drinks    Types: 2 Cans of beer per week    Frequency: Never   No Known Allergies  Past Medical History:  Diagnosis Date  . Chronic systolic CHF (congestive heart failure) (HCC) 06/2017   a. TTE 11/18: EF of 20-25% , difuse hypokinesis, mild to moderate tricuspid regurgitation, and mild pulmonary hypertension  . Coronary artery disease 06/2017   Non-ST elevation myocardial infarction.  Treated medically with no cardiac cath done due to active infection and renal failure.  . Dermatitis   . HIV (human immunodeficiency virus infection) (HCC)   . Hypertension   . Pulmonary hypertension (HCC)    a. TTE 11/18: EF of 20-25% , difuse hypokinesis, mild to moderate tricuspid regurgitation, and mild pulmonary hypertension w/ PASP 40 mmHg   Past Surgical History:  Procedure Laterality Date  . APPENDECTOMY    . IR RADIOLOGIST EVAL & MGMT  06/19/2017   Family History  Problem Relation Age of Onset  . Cerebral aneurysm Brother        died  . Hypertension Mother   . Diabetes Mother   . Heart attack Father    Social History   Tobacco Use  . Smoking status: Current Some Day Smoker    Packs/day: 0.50    Types: Cigarettes    Last attempt to quit: 06/11/2017    Years since quitting: 1.1  . Smokeless tobacco: Never Used  . Tobacco comment: 02/17/2018- Smokes 2 cigarettes a day  Substance Use Topics  . Alcohol use: Yes    Alcohol/week: 2.0 standard drinks    Types: 2 Cans of beer per week    Frequency: Never   No Known  Allergies  Prior to Admission medications   Medication Sig Start Date End Date Taking? Authorizing Provider  aspirin 81 MG EC tablet Take 1 tablet (81 mg total) by mouth daily. 06/24/18  Yes Clarisa KindredHackney, Tavarius Grewe A, FNP  atorvastatin (LIPITOR) 40 MG tablet Take 1 tablet (40 mg total) by mouth daily at 6 PM. 06/24/18  Yes Khyran Riera A, FNP  bictegravir-emtricitabine-tenofovir AF (BIKTARVY) 50-200-25 MG TABS tablet Take 1 tablet by mouth daily.   Yes [provider]  carvedilol (COREG) 6.25 MG tablet Take 1 tablet (6.25 mg total) by mouth 2 (two) times daily. 06/24/18  Yes Shawan Corella, Inetta Fermoina A, FNP  nitroGLYCERIN (NITROSTAT) 0.4 MG SL tablet Place 1 tablet (0.4 mg total) under the tongue every 5 (five) minutes as needed for chest pain. 06/24/18  Yes Tashe Purdon A, FNP  sacubitril-valsartan (ENTRESTO) 24-26 MG Take 1 tablet by mouth 2 (two) times daily. 06/24/18  Yes Aadhav Uhlig, Inetta Fermoina A, FNP  torsemide (DEMADEX) 20 MG tablet Take 1 tablet (20 mg total) by mouth daily. May take 1 additional tablet (20 mg) as needed for shortness of breath/weight gain of 3 lbs. 06/24/18  Yes Delma FreezeHackney, Christan Ciccarelli A, FNP    Review of Systems  Constitutional: Positive for fatigue (minimal). Negative for appetite change.  HENT: Negative for congestion, postnasal drip and sore throat.   Eyes: Negative.   Respiratory: Negative for chest tightness and shortness of breath.   Cardiovascular: Negative for chest pain, palpitations and leg swelling.  Gastrointestinal: Negative for abdominal distention and abdominal pain.  Endocrine: Negative.   Genitourinary: Negative.   Musculoskeletal: Negative for back pain and neck pain.  Skin: Negative.   Allergic/Immunologic: Negative.   Neurological: Negative for dizziness and light-headedness.  Hematological: Negative for adenopathy. Does not bruise/bleed easily.  Psychiatric/Behavioral: Negative for dysphoric mood and sleep disturbance. The patient is not nervous/anxious.    Vitals:    08/12/18 0844 08/12/18 0857  BP: (!) 160/113 104/70  Pulse: 86   Resp: 18   SpO2: 99%   Weight: 200 lb 4 oz (90.8 kg)   Height: 6\' 1"  (1.854 m)    Wt Readings from Last 3 Encounters:  08/12/18 200 lb 4 oz (90.8 kg)  02/17/18 186 lb 6 oz (84.5 kg)  10/03/17 164 lb 8 oz (74.6 kg)   Lab Results  Component Value Date   CREATININE 2.18 (H) 10/03/2017   CREATININE 2.34 (H) 09/03/2017   CREATININE 2.36 (H) 09/02/2017    Physical Exam  Constitutional: He is oriented to person, place, and time. He appears well-developed and well-nourished.  HENT:  Head: Normocephalic and atraumatic.  Neck: Normal range of motion. Neck supple. No JVD present.  Cardiovascular: Normal rate and regular rhythm.  Pulmonary/Chest: Effort  normal. He has no wheezes. He has no rales.  Abdominal: He exhibits no distension. There is no abdominal tenderness.  Musculoskeletal:        General: No tenderness or edema.  Neurological: He is alert and oriented to person, place, and time.  Skin: Skin is warm and dry.  Psychiatric: He has a normal mood and affect. His behavior is normal. Thought content normal.  Nursing note and vitals reviewed.  Assessment & Plan:  1: Chronic heart failure with reduced ejection fraction- - NYHA class II - euvolemic today - weighing daily. Instructed to call for an overnight weight gain of >2 pounds or a weekly weight gain of >5 pounds - weight up 14 pounds from last visit ~ 6 months ago - not adding salt and is using Mrs Sharilyn Sites for seasoning. Reminded to closely follow a 2000mg  sodium diet - saw cardiology ([place]) 10/03/17 & will R/S appointment with him - will schedule echo to evaluate HF since being on entresto for ~ 1 year - drinking ~56 ounces of fluid daily  2: HTN- - BP elevated initially (160/113) but was much better upon recheck using a manual cuff (104/70) - has been back on entresto for the last 2 days - BMP from 10/03/17 reviewed and showed sodium 138, potassium 5.3 and GFR  39  - follows at [facility]  3: Tobacco use- - smoking 1-2 cigarettes daily - complete cessation discussed for 3 minutes with patient   Patient did not bring his medications nor a list. Each medication was verbally reviewed with the patient and he was encouraged to bring the bottles to every visit to confirm accuracy of list.  Return in 2 months or sooner for any questions/problems before then.

## 2018-08-12 ENCOUNTER — Ambulatory Visit: Payer: Medicaid Other | Attending: Family | Admitting: Family

## 2018-08-12 ENCOUNTER — Encounter: Payer: Self-pay | Admitting: Family

## 2018-08-12 VITALS — BP 104/70 | HR 86 | Resp 18 | Ht 73.0 in | Wt 200.2 lb

## 2018-08-12 DIAGNOSIS — Z79899 Other long term (current) drug therapy: Secondary | ICD-10-CM | POA: Insufficient documentation

## 2018-08-12 DIAGNOSIS — I272 Pulmonary hypertension, unspecified: Secondary | ICD-10-CM | POA: Diagnosis not present

## 2018-08-12 DIAGNOSIS — I252 Old myocardial infarction: Secondary | ICD-10-CM | POA: Insufficient documentation

## 2018-08-12 DIAGNOSIS — I110 Hypertensive heart disease with heart failure: Secondary | ICD-10-CM | POA: Insufficient documentation

## 2018-08-12 DIAGNOSIS — F17210 Nicotine dependence, cigarettes, uncomplicated: Secondary | ICD-10-CM | POA: Insufficient documentation

## 2018-08-12 DIAGNOSIS — Z720 Tobacco use: Secondary | ICD-10-CM

## 2018-08-12 DIAGNOSIS — Z21 Asymptomatic human immunodeficiency virus [HIV] infection status: Secondary | ICD-10-CM | POA: Diagnosis not present

## 2018-08-12 DIAGNOSIS — Z7982 Long term (current) use of aspirin: Secondary | ICD-10-CM | POA: Diagnosis not present

## 2018-08-12 DIAGNOSIS — Z8249 Family history of ischemic heart disease and other diseases of the circulatory system: Secondary | ICD-10-CM | POA: Insufficient documentation

## 2018-08-12 DIAGNOSIS — I5022 Chronic systolic (congestive) heart failure: Secondary | ICD-10-CM | POA: Insufficient documentation

## 2018-08-12 DIAGNOSIS — I10 Essential (primary) hypertension: Secondary | ICD-10-CM

## 2018-08-12 DIAGNOSIS — I2510 Atherosclerotic heart disease of native coronary artery without angina pectoris: Secondary | ICD-10-CM | POA: Diagnosis not present

## 2018-08-12 DIAGNOSIS — K6812 Psoas muscle abscess: Secondary | ICD-10-CM | POA: Diagnosis not present

## 2018-08-12 NOTE — Patient Instructions (Signed)
Continue weighing daily and call for an overnight weight gain of > 2 pounds or a weekly weight gain of >5 pounds. 

## 2018-08-20 ENCOUNTER — Ambulatory Visit: Payer: Medicaid Other | Admitting: Family

## 2018-08-24 NOTE — Progress Notes (Deleted)
Cardiology Office Note Date:  08/24/2018  Patient ID:  Manuel Rosales Dec 22, 1965, MRN [id number] PCP:  [facility]  Cardiologist:  Dr. Kirke Corin, MD  ***refresh   Chief Complaint: Follow up  History of Present Illness: Manuel Rosales is a 53 y.o. male with history of CAD s/p NSTEMI in 06/2017 in the setting of an abdominal abscess, chronic systolic CHF, pulmonary HTN, HIV, ARF, HTN, and tobacco abuse who presents for follow up chronic systolic CHF.   He was admitted in 06/2017 with SOB, orthopnea, and abdominal swelling with poorly controlled hypertension. CT abdomen and pelvis showed fluid collection involving the right psoas/ileus muscles. His troponin peaked at 1.87. Echo 06/09/17, showed an EF of 20-25% , diffuse hypokinesis, mild to moderate tricuspid regurgitation, and mild pulmonary hypertension. Cardiac catheterization was not performed due to active infection and acute renal failure. He underwent CT-guided drainage of the abscess and was treated with antibiotics. He was seen in the ED on 06/29/17 with worsening SOB. Troponin was mildly elevated a 0.04. CTA chest negative for PE, though there was concern for bilateral upper lobe PNA with trace bilateral pleural effusions. EKG showed NSR, 85 bpm, right axis deviation, diffuse TWI. He was discharged with outpatient follow up and was given azithromycin along with amoxicillin in the ED. He was seen by Dr. Kirke Corin on 07/01/17 and noted worsening orthopnea and PND. His weight had increased from 146-->160 pounds. He was felt to be volume overloaded and Lasix 20 mg once daily was added to his medication regimen along with increasing his Coreg to 12.5 mg bid. He was continued on Entresto. He was seen in the [facility] on 07/18/17 with a weight up to 166 pounds (up 14 pounds from prior CHF visit). His Lasix was changed to torsemide 20 mg daily (though what was sent in was torsemide 40 mg bid) and he was advised to take  metolazone 2.5 mg daily x 3 days. Labs checked that day, 07/18/17, showed a BUN/SCr 22/1.47, K+ 4.5. In follow up with the [facility] on 07/23/17 his weight was noted to be 150 pounds, BP 92/55. His breathing had improved, though he noted positional dizziness. Follow up labs checked 07/23/17, at outside office, showed worsening renal function with a BUN/SCr 56/3.58 (basline SCr 1-1.4), CO2 of 34. Torsemide was decreased from 40 mg bid to 40 mg daily. Recheck bmet at the CHF Clinic on 12/21 showed BUN/SCr 49/2.48, K+ 4.4. He was continued on torsemide 40 mg daily. Seen by the [facility] on 08/20/2017 with a weight of 160 pounds. No changes were made. Labs from 08/20/2017 showed a BUN/SCr 30/2.39, K+ 4.6 with hemolysis noted. I saw him on 08/25/2017 for follow up of his CHF and he was doing well at that time. He was noted to conitnue to eat foods high in salt and was drinking > 2 L of fluids daily. He reported compliance with medications. His weight log showed his weight had been slowly trending up over the month of January from 147-->156 pounds. Patient felt like this was secondary to food intake. Recheck bmet on 08/25/17, showed a BUN/SCr 30/2.06. He was continued on torsemide 40 mg daily with an extra 20 mg prn for weight gain/SOB. He was also referred to nephrology given worsening renal function. In follow up on 09/02/17, he was doing well.  Weights at home had been stable in the mid 150s. Labs on 09/02/17 showed SCr 2.36. His torsemide was decreased to 20  mg daily and his Sherryll Burger was held. Repeat bmet on 1/30 showed SCr 2.34 and K+ 4.1. He was continued on torsemide 20 mg daily with 20 mg prn for SOB/swelling/weight gain.  He was last seen in our office in 10/2017 and was doing well with weights in the upper 150s to the 160s.  He reported a vigorous appetite.  His BP was soft in the 90s over 60s.  In that setting, his Coreg was decreased to 6.25 mg twice daily and he was continued on Entresto as  well as torsemide 20 mg daily.  He was not on spironolactone given his underlying CKD.  We continue to be unable to evaluate his cardiomyopathy with a catheterization in the setting of his underlying renal disease.  We scheduled the patient for a Lexiscan Myoview to evaluate for high risk ischemia though he was a no-show several times.  This study is yet to be completed.  He was most recently seen by the [facility]F clinic on 08/12/2018 with a weight of 200 pounds, which was up 14 pounds from his visit with him in 02/2018 and 36 pounds up from when I last saw him in 10/2017.  No changes were made.  Most recent labs: 10/2017 - BUN 27, serum creatinine 2.18, potassium 5.3  ***  Past Medical History:  Diagnosis Date  . Chronic systolic CHF (congestive heart failure) (HCC) 06/2017   a. TTE 11/18: EF of 20-25% , difuse hypokinesis, mild to moderate tricuspid regurgitation, and mild pulmonary hypertension  . Coronary artery disease 06/2017   Non-ST elevation myocardial infarction.  Treated medically with no cardiac cath done due to active infection and renal failure.  . Dermatitis   . HIV (human immunodeficiency virus infection) (HCC)   . Hypertension   . Pulmonary hypertension (HCC)    a. TTE 11/18: EF of 20-25% , difuse hypokinesis, mild to moderate tricuspid regurgitation, and mild pulmonary hypertension w/ PASP 40 mmHg    Past Surgical History:  Procedure Laterality Date  . APPENDECTOMY    . IR RADIOLOGIST EVAL & MGMT  06/19/2017    No outpatient medications have been marked as taking for the 08/25/18 encounter (Appointment) with Sondra Barges, PA-C.    Allergies:   Patient has no known allergies.   Social History:  The patient  reports that he has been smoking cigarettes. He has been smoking about 0.50 packs per day. He has never used smokeless tobacco. He reports current alcohol use of about 2.0 standard drinks of alcohol per week. He reports previous drug use. Drug: Marijuana.   Family  History:  The patient's family history includes Cerebral aneurysm in his brother; Diabetes in his mother; Heart attack in his father; Hypertension in his mother.  ROS:   ROS   PHYSICAL EXAM: *** VS:  There were no vitals taken for this visit. BMI: There is no height or weight on file to calculate BMI.  Physical Exam   EKG:  Was ordered and interpreted by me today. Shows ***  Recent Labs: 10/03/2017: BUN 27; Creatinine, Ser 2.18; Potassium 5.3; Sodium 138  No results found for requested labs within last 8760 hours.   CrCl cannot be calculated (Patient's most recent lab result is older than the maximum 21 days allowed.).   Wt Readings from Last 3 Encounters:  08/12/18 200 lb 4 oz (90.8 kg)  02/17/18 186 lb 6 oz (84.5 kg)  10/03/17 164 lb 8 oz (74.6 kg)     Other studies reviewed: Additional  studies/records reviewed today include: summarized above  ASSESSMENT AND PLAN:  1. ***  Disposition: F/u with Dr. Kirke CorinArida or an APP in ***  Current medicines are reviewed at length with the patient today.  The patient did not have any concerns regarding medicines.  Signed, Eula Listenyan Christophr Calix, PA-C 08/24/2018 8:08 AM     [facility] [place] [address] [place], [place] [address] [phone]

## 2018-08-25 ENCOUNTER — Ambulatory Visit
Admission: RE | Admit: 2018-08-25 | Discharge: 2018-08-25 | Disposition: A | Discharge status: Home / Self Care | Payer: Medicaid Other | Source: Ambulatory Visit | Attending: Family | Admitting: Family

## 2018-08-25 ENCOUNTER — Ambulatory Visit: Payer: Medicaid Other | Admitting: Physician Assistant

## 2018-08-25 DIAGNOSIS — I5022 Chronic systolic (congestive) heart failure: Secondary | ICD-10-CM | POA: Insufficient documentation

## 2018-08-25 DIAGNOSIS — I313 Pericardial effusion (noninflammatory): Secondary | ICD-10-CM | POA: Insufficient documentation

## 2018-08-25 DIAGNOSIS — I2510 Atherosclerotic heart disease of native coronary artery without angina pectoris: Secondary | ICD-10-CM | POA: Insufficient documentation

## 2018-08-25 DIAGNOSIS — I110 Hypertensive heart disease with heart failure: Secondary | ICD-10-CM | POA: Insufficient documentation

## 2018-08-25 DIAGNOSIS — Z21 Asymptomatic human immunodeficiency virus [HIV] infection status: Secondary | ICD-10-CM | POA: Insufficient documentation

## 2018-08-25 NOTE — Progress Notes (Signed)
*  PRELIMINARY RESULTS* Echocardiogram 2D Echocardiogram has been performed.  Joanette GulaJoan M Charletta Voight 08/25/2018, 10:53 AM

## 2018-09-30 ENCOUNTER — Ambulatory Visit: Payer: Medicaid Other | Admitting: Family

## 2018-10-08 NOTE — Progress Notes (Signed)
Patient ID: Manuel Rosales, male    DOB: 1966/05/26, 53 y.o.   MRN: [id number]  HPI  Manuel Rosales is a 53 y/o male with a history of HTN, HIV, psoas abscess, current tobacco use and chronic heart failure.   Echo report from 08/25/2018 reviewed and showed an EF of 50-55%. Echo report from 06/09/17 reviewed and shows an EF of 20-25% along with mild/mod TR and mildly elevated PA pressure of 40 mm Hg.   Has not been in the ED or admitted in the last 6 months.    He presents today for a follow-up visit with a chief complaint of minimal fatigue upon moderate exertion. He describes this as chronic in nature having been present for several years. He has associated cough along with this. He denies any difficulty sleeping, dizziness, abdominal distention, palpitations, pedal edema, chest pain, shortness of breath or weight gain. Says that his scales have broke. Had about one week where he says that he didn't eat anything. Has been diagnosed with diabetes since he was last here.   Past Medical History:  Diagnosis Date  . Chronic systolic CHF (congestive heart failure) (HCC) 06/2017   a. TTE 11/18: EF of 20-25% , difuse hypokinesis, mild to moderate tricuspid regurgitation, and mild pulmonary hypertension  . Coronary artery disease 06/2017   Non-ST elevation myocardial infarction.  Treated medically with no cardiac cath done due to active infection and renal failure.  . Dermatitis   . HIV (human immunodeficiency virus infection) (HCC)   . Hypertension   . Pulmonary hypertension (HCC)    a. TTE 11/18: EF of 20-25% , difuse hypokinesis, mild to moderate tricuspid regurgitation, and mild pulmonary hypertension w/ PASP 40 mmHg   Past Surgical History:  Procedure Laterality Date  . APPENDECTOMY    . IR RADIOLOGIST EVAL & MGMT  06/19/2017   Family History  Problem Relation Age of Onset  . Cerebral aneurysm Brother        died  . Hypertension Mother   . Diabetes Mother   . Heart attack Father     Social History   Tobacco Use  . Smoking status: Current Some Day Smoker    Packs/day: 0.50    Types: Cigarettes    Last attempt to quit: 06/11/2017    Years since quitting: 1.3  . Smokeless tobacco: Never Used  . Tobacco comment: 02/17/2018- Smokes 2 cigarettes a day  Substance Use Topics  . Alcohol use: Yes    Alcohol/week: 2.0 standard drinks    Types: 2 Cans of beer per week    Frequency: Never   No Known Allergies  Past Medical History:  Diagnosis Date  . Chronic systolic CHF (congestive heart failure) (HCC) 06/2017   a. TTE 11/18: EF of 20-25% , difuse hypokinesis, mild to moderate tricuspid regurgitation, and mild pulmonary hypertension  . Coronary artery disease 06/2017   Non-ST elevation myocardial infarction.  Treated medically with no cardiac cath done due to active infection and renal failure.  . Dermatitis   . HIV (human immunodeficiency virus infection) (HCC)   . Hypertension   . Pulmonary hypertension (HCC)    a. TTE 11/18: EF of 20-25% , difuse hypokinesis, mild to moderate tricuspid regurgitation, and mild pulmonary hypertension w/ PASP 40 mmHg   Past Surgical History:  Procedure Laterality Date  . APPENDECTOMY    . IR RADIOLOGIST EVAL & MGMT  06/19/2017   Family History  Problem Relation Age of Onset  . Cerebral aneurysm Brother  died  . Hypertension Mother   . Diabetes Mother   . Heart attack Father    Social History   Tobacco Use  . Smoking status: Current Some Day Smoker    Packs/day: 0.50    Types: Cigarettes    Last attempt to quit: 06/11/2017    Years since quitting: 1.3  . Smokeless tobacco: Never Used  . Tobacco comment: 02/17/2018- Smokes 2 cigarettes a day  Substance Use Topics  . Alcohol use: Yes    Alcohol/week: 2.0 standard drinks    Types: 2 Cans of beer per week    Frequency: Never   No Known Allergies  Prior to Admission medications   Medication Sig Start Date End Date Taking? Authorizing Provider  aspirin 81 MG EC  tablet Take 1 tablet (81 mg total) by mouth daily. 06/24/18  Yes Clarisa Kindred A, FNP  atorvastatin (LIPITOR) 40 MG tablet Take 1 tablet (40 mg total) by mouth daily at 6 PM. 06/24/18  Yes Daison Braxton A, FNP  bictegravir-emtricitabine-tenofovir AF (BIKTARVY) 50-200-25 MG TABS tablet Take 1 tablet by mouth daily.   Yes [provider]  carvedilol (COREG) 6.25 MG tablet Take 1 tablet (6.25 mg total) by mouth 2 (two) times daily. 06/24/18  Yes Teyah Rossy A, FNP  glipiZIDE (GLUCOTROL) 5 MG tablet Take 5 mg by mouth daily before breakfast.   Yes [provider]  metFORMIN (GLUCOPHAGE) 1000 MG tablet Take 1,000 mg by mouth 2 (two) times daily with a meal.   Yes [provider]  nitroGLYCERIN (NITROSTAT) 0.4 MG SL tablet Place 1 tablet (0.4 mg total) under the tongue every 5 (five) minutes as needed for chest pain. 06/24/18  Yes Brynnly Bonet A, FNP  sacubitril-valsartan (ENTRESTO) 24-26 MG Take 1 tablet by mouth 2 (two) times daily. 06/24/18  Yes Kiki Bivens, Inetta Fermo A, FNP  torsemide (DEMADEX) 20 MG tablet Take 1 tablet (20 mg total) by mouth daily. May take 1 additional tablet (20 mg) as needed for shortness of breath/weight gain of 3 lbs. 06/24/18  Yes Delma Freeze, FNP    Review of Systems  Constitutional: Positive for fatigue (minimal). Negative for appetite change.  HENT: Negative for congestion, postnasal drip and sore throat.   Eyes: Negative.   Respiratory: Positive for cough. Negative for chest tightness and shortness of breath.   Cardiovascular: Negative for chest pain, palpitations and leg swelling.  Gastrointestinal: Negative for abdominal distention and abdominal pain.  Endocrine: Negative.   Genitourinary: Negative.   Musculoskeletal: Negative for back pain and neck pain.  Skin: Negative.   Allergic/Immunologic: Negative.   Neurological: Negative for dizziness and light-headedness.  Hematological: Negative for adenopathy. Does not bruise/bleed easily.   Psychiatric/Behavioral: Negative for dysphoric mood and sleep disturbance. The patient is not nervous/anxious.    Vitals:   10/12/18 1452  BP: (!) 92/57  Pulse: 91  Resp: 18  SpO2: 100%  Weight: 177 lb 8 oz (80.5 kg)  Height: 6\' 1"  (1.854 m)   Wt Readings from Last 3 Encounters:  10/12/18 177 lb 8 oz (80.5 kg)  08/12/18 200 lb 4 oz (90.8 kg)  02/17/18 186 lb 6 oz (84.5 kg)   Lab Results  Component Value Date   CREATININE 2.18 (H) 10/03/2017   CREATININE 2.34 (H) 09/03/2017   CREATININE 2.36 (H) 09/02/2017    Physical Exam  Constitutional: He is oriented to person, place, and time. He appears well-developed and well-nourished.  HENT:  Head: Normocephalic and atraumatic.  Neck: Normal range of  motion. Neck supple. No JVD present.  Cardiovascular: Normal rate and regular rhythm.  Pulmonary/Chest: Effort normal. He has no wheezes. He has no rales.  Abdominal: He exhibits no distension. There is no abdominal tenderness.  Musculoskeletal:        General: No tenderness or edema.  Neurological: He is alert and oriented to person, place, and time.  Skin: Skin is warm and dry.  Psychiatric: He has a normal mood and affect. His behavior is normal. Thought content normal.  Nursing note and vitals reviewed.  Assessment & Plan:  1: Chronic heart failure with now preserved ejection fraction- - NYHA class II - euvolemic today - not weighing daily as his scales broke. Set of scales given to him and he was reminded to call for an overnight weight gain of >2 pounds or a weekly weight gain of >5 pounds - weight down 22 pounds from last visit 2 months ago; has been diagnosed with diabetes since last here - EF has improved quite a bit from 20-25% to now 50-55% - not adding salt and is using Mrs Sharilyn Sites for seasoning. Reminded to closely follow a  sodium diet - saw cardiology ([place]) 10/03/17; f/u appointment has been scheduled - drinking ~56 ounces of fluid daily - PharmD reconciled  medications with the patient  2: HTN- - BP on the low side chronically; no room for titration of medications - BMP from 10/03/17 reviewed and showed sodium 138, potassium 5.3 and GFR 39  - [facility]  3: Tobacco use- - smoking 3-5 cigarettes daily - complete cessation discussed for 3 minutes with patient   Patient did not bring his medications nor a list. Each medication was verbally reviewed with the patient and he was encouraged to bring the bottles to every visit to confirm accuracy of list.  Return in 4 months or sooner for any questions/problems before then.

## 2018-10-12 ENCOUNTER — Encounter: Payer: Self-pay | Admitting: Pharmacist

## 2018-10-12 ENCOUNTER — Encounter: Payer: Self-pay | Admitting: Family

## 2018-10-12 ENCOUNTER — Ambulatory Visit: Payer: Medicaid Other | Attending: Family | Admitting: Family

## 2018-10-12 VITALS — BP 92/57 | HR 91 | Resp 18 | Ht 73.0 in | Wt 177.5 lb

## 2018-10-12 DIAGNOSIS — R05 Cough: Secondary | ICD-10-CM | POA: Diagnosis not present

## 2018-10-12 DIAGNOSIS — Z720 Tobacco use: Secondary | ICD-10-CM

## 2018-10-12 DIAGNOSIS — F17210 Nicotine dependence, cigarettes, uncomplicated: Secondary | ICD-10-CM | POA: Insufficient documentation

## 2018-10-12 DIAGNOSIS — Z7901 Long term (current) use of anticoagulants: Secondary | ICD-10-CM | POA: Insufficient documentation

## 2018-10-12 DIAGNOSIS — I5022 Chronic systolic (congestive) heart failure: Secondary | ICD-10-CM | POA: Insufficient documentation

## 2018-10-12 DIAGNOSIS — I272 Pulmonary hypertension, unspecified: Secondary | ICD-10-CM | POA: Insufficient documentation

## 2018-10-12 DIAGNOSIS — I10 Essential (primary) hypertension: Secondary | ICD-10-CM

## 2018-10-12 DIAGNOSIS — B20 Human immunodeficiency virus [HIV] disease: Secondary | ICD-10-CM | POA: Insufficient documentation

## 2018-10-12 DIAGNOSIS — Z7982 Long term (current) use of aspirin: Secondary | ICD-10-CM | POA: Insufficient documentation

## 2018-10-12 DIAGNOSIS — I252 Old myocardial infarction: Secondary | ICD-10-CM | POA: Diagnosis not present

## 2018-10-12 DIAGNOSIS — I2510 Atherosclerotic heart disease of native coronary artery without angina pectoris: Secondary | ICD-10-CM | POA: Insufficient documentation

## 2018-10-12 DIAGNOSIS — Z8249 Family history of ischemic heart disease and other diseases of the circulatory system: Secondary | ICD-10-CM | POA: Insufficient documentation

## 2018-10-12 DIAGNOSIS — I110 Hypertensive heart disease with heart failure: Secondary | ICD-10-CM | POA: Insufficient documentation

## 2018-10-12 DIAGNOSIS — Z7984 Long term (current) use of oral hypoglycemic drugs: Secondary | ICD-10-CM | POA: Diagnosis not present

## 2018-10-12 DIAGNOSIS — Z79899 Other long term (current) drug therapy: Secondary | ICD-10-CM | POA: Diagnosis not present

## 2018-10-12 DIAGNOSIS — I5032 Chronic diastolic (congestive) heart failure: Secondary | ICD-10-CM

## 2018-10-12 DIAGNOSIS — E119 Type 2 diabetes mellitus without complications: Secondary | ICD-10-CM | POA: Insufficient documentation

## 2018-10-12 DIAGNOSIS — R5383 Other fatigue: Secondary | ICD-10-CM | POA: Diagnosis present

## 2018-10-12 NOTE — Progress Notes (Signed)
[facility] - HEART FAILURE CLINIC - PHARMACIST COUNSELING NOTE  ADHERENCE ASSESSMENT  Adherence strategy: pill box   Do you ever forget to take your medication? Yes (1) No (0)  Do you ever skip doses due to side effects? Yes (1) No (0)  Do you have trouble affording your medicines? Yes (1) No (0)  Are you ever unable to pick up your medication due to transportation difficulties? Yes (1) No (0)  Do you ever stop taking your medications because you don't believe they are helping? Yes (1) No (0)  Total score _0______    Recommendations given to patient about increasing adherence: None. Patient and girlfriend report that he is very diligent about taking medications and makes sure he has them with him if he knows he will be away from the house at the time he is supposed to take them.  Guideline-Directed Medical Therapy/Evidence Based Medicine    ACE/ARB/ARNI: Entresto 24/26 mg twice daily   Beta Blocker: carvedilol 6.25 mg twice daily   Aldosterone Antagonist: none Diuretic: torsemide 20 mg daily (takes an extra 20 mg for SOB/weight gain of 3 pounds)    SUBJECTIVE   HPI: Here for follow up visit. Recently diagnosed with diabetes on metformin and glipizide. Has been feeling very nauseous when he takes it. Not weighing himself daily because his scale is broken.  Past Medical History:  Diagnosis Date  . Chronic systolic CHF (congestive heart failure) (HCC) 06/2017   a. TTE 11/18: EF of 20-25% , difuse hypokinesis, mild to moderate tricuspid regurgitation, and mild pulmonary hypertension  . Coronary artery disease 06/2017   Non-ST elevation myocardial infarction.  Treated medically with no cardiac cath done due to active infection and renal failure.  . Dermatitis   . HIV (human immunodeficiency virus infection) (HCC)   . Hypertension   . Pulmonary hypertension (HCC)    a. TTE 11/18: EF of 20-25% , difuse hypokinesis, mild to moderate  tricuspid regurgitation, and mild pulmonary hypertension w/ PASP 40 mmHg        OBJECTIVE    Vital signs: HR 91, BP 92/57, weight (pounds) 177.8  ECHO: Date 08/25/18, EF 50-55%, improved from EF 20-25% 06/09/17   BMP Latest Ref Rng & Units 10/03/2017 09/03/2017 09/02/2017  Glucose 65 - 99 mg/dL 161(W) 960(A) 540(J)  BUN 6 - 24 mg/dL 81(X) 91(Y) 78(G)  Creatinine 0.76 - 1.27 mg/dL 9.56(O) 1.30(Q) 6.57(Q)  BUN/Creat Ratio 9 - 20 12 - 12  Sodium 134 - 144 mmol/L 138 132(L) 136  Potassium 3.5 - 5.2 mmol/L 5.3(H) 4.1 5.7(H)  Chloride 96 - 106 mmol/L 98 96(L) 95(L)  CO2 20 - 29 mmol/L Calcium 8.7 - 10.2 mg/dL 46.9 9.2 62.9    ASSESSMENT 53 year old male now with HFpEF after EF improved on Entresto and carvedilol. Reports good compliance with medications. Has not been weighing daily because his scale was broken. Denies shortness of breath or weight gain. He was having issues getting Entresto after EF improved, but that issue has been resolved and insurance is paying again. He was recently diagnosed with diabetes and started on metformin and glipizide. He is seeing a nutritionist soon. He reports nausea with the metformin. He is not taking it with food. He had several questions regarding his diabetes medications. BP low but patient denies dizziness/light-headedness.  PLAN Answered all questions regarding diabetes medications. Counseled to take metformin with food to reduce nausea and that nausea can improve after a couple of  weeks. I also mentioned metformin XR if nausea does not improve and that he should discuss this with PCP at next visit.  Continue current HF regimen. Patient provided with scale to replace broken one and encouraged to start weighing daily again.    Time spent: 10 minutes  Abby K Ellington, Pharm.D. 10/12/2018 3:13 PM    Current Outpatient Medications:  .  aspirin 81 MG EC tablet, Take 1 tablet (81 mg total) by mouth daily., Disp: 90 tablet, Rfl: 3 .   atorvastatin (LIPITOR) 40 MG tablet, Take 1 tablet (40 mg total) by mouth daily at 6 PM., Disp: 90 tablet, Rfl: 3 .  bictegravir-emtricitabine-tenofovir AF (BIKTARVY) 50-200-25 MG TABS tablet, Take 1 tablet by mouth daily., Disp: , Rfl:  .  carvedilol (COREG) 6.25 MG tablet, Take 1 tablet (6.25 mg total) by mouth 2 (two) times daily., Disp: 180 tablet, Rfl: 3 .  glipiZIDE (GLUCOTROL) 5 MG tablet, Take 5 mg by mouth daily before breakfast., Disp: , Rfl:  .  metFORMIN (GLUCOPHAGE) 1000 MG tablet, Take 1,000 mg by mouth 2 (two) times daily with a meal., Disp: , Rfl:  .  nitroGLYCERIN (NITROSTAT) 0.4 MG SL tablet, Place 1 tablet (0.4 mg total) under the tongue every 5 (five) minutes as needed for chest pain., Disp: 30 tablet, Rfl: 1 .  sacubitril-valsartan (ENTRESTO) 24-26 MG, Take 1 tablet by mouth 2 (two) times daily., Disp: 180 tablet, Rfl: 3 .  torsemide (DEMADEX) 20 MG tablet, Take 1 tablet (20 mg total) by mouth daily. May take 1 additional tablet (20 mg) as needed for shortness of breath/weight gain of 3 lbs., Disp: 90 tablet, Rfl: 3   COUNSELING POINTS/CLINICAL PEARLS  Carvedilol (Goal: weight less than 85 kg is 25 mg BID, weight greater than 85 kg is 50 mg BID)  Patient should avoid activities requiring coordination until drug effects are realized, as drug may cause dizziness.  This drug may cause diarrhea, nausea, vomiting, arthralgia, back pain, myalgia, headache, vision disorder, erectile dysfunction, reduced libido, or fatigue.  Instruct patient to report signs/symptoms of adverse cardiovascular effects such as hypotension (especially in elderly patients), arrhythmias, syncope, palpitations, angina, or edema.  Drug may mask symptoms of hypoglycemia. Advise diabetic patients to carefully monitor blood sugar levels.  Patient should take drug with food.  Advise patient against sudden discontinuation of drug. Entresto (Goal: 97/103 mg twice daily)  Warn male patient to avoid pregnancy  during therapy and to report a pregnancy to a physician.  Advise patient to report symptomatic hypotension.  Side effects may include hyperkalemia, cough, dizziness, or renal failure. Torsemide  Side effects may include excessive urination.  Tell patient to report symptoms of ototoxicity.  Instruct patient to report lightheadedness or syncope.  Warn patient to avoid use of nonprescription NSAID products without first discussing it with their healthcare provider.   DRUGS TO AVOID IN HEART FAILURE  Drug or Class Mechanism  Analgesics . NSAIDs . COX-2 inhibitors . Glucocorticoids  Sodium and water retention, increased systemic vascular resistance, decreased response to diuretics   Diabetes Medications . Metformin . Thiazolidinediones o Rosiglitazone (Avandia) o Pioglitazone (Actos) . DPP4 Inhibitors o Saxagliptin (Onglyza) o Sitagliptin (Januvia)   Lactic acidosis Possible calcium channel blockade   Unknown  Antiarrhythmics . Class I  o Flecainide o Disopyramide . Class III o Sotalol . Other o Dronedarone  Negative inotrope, proarrhythmic   Proarrhythmic, beta blockade  Negative inotrope  Antihypertensives . Alpha Blockers o Doxazosin . Calcium Channel Blockers o Diltiazem o  Verapamil o Nifedipine . Central Alpha Adrenergics o Moxonidine . Peripheral Vasodilators o Minoxidil  Increases renin and aldosterone  Negative inotrope    Possible sympathetic withdrawal  Unknown  Anti-infective . Itraconazole . Amphotericin B  Negative inotrope Unknown  Hematologic . Anagrelide . Cilostazol   Possible inhibition of PD IV Inhibition of PD III causing arrhythmias  Neurologic/Psychiatric . Stimulants . Anti-Seizure Drugs o Carbamazepine o Pregabalin . Antidepressants o Tricyclics o Citalopram . Parkinsons o Bromocriptine o Pergolide o Pramipexole . Antipsychotics o Clozapine . Antimigraine o Ergotamine o Methysergide . Appetite  suppressants . Bipolar o Lithium  Peripheral alpha and beta agonist activity  Negative inotrope and chronotrope Calcium channel blockade  Negative inotrope, proarrhythmic Dose-dependent QT prolongation  Excessive serotonin activity/valvular damage Excessive serotonin activity/valvular damage Unknown  IgE mediated hypersensitivy, calcium channel blockade  Excessive serotonin activity/valvular damage Excessive serotonin activity/valvular damage Valvular damage  Direct myofibrillar degeneration, adrenergic stimulation  Antimalarials . Chloroquine . Hydroxychloroquine Intracellular inhibition of lysosomal enzymes  Urologic Agents . Alpha Blockers o Doxazosin o Prazosin o Tamsulosin o Terazosin  Increased renin and aldosterone  Adapted from Page RL, et al. "Drugs That May Cause or Exacerbate Heart Failure: A Scientific Statement from the American Heart  Association." Circulation 2016; 134:e32-e69. DOI: 10.1161/CIR.0000000000000426   MEDICATION ADHERENCES TIPS AND STRATEGIES 1. Taking medication as prescribed improves patient outcomes in heart failure (reduces hospitalizations, improves symptoms, increases survival) 2. Side effects of medications can be managed by decreasing doses, switching agents, stopping drugs, or adding additional therapy. Please let someone in the Heart Failure Clinic know if you have having bothersome side effects so we can modify your regimen. Do not alter your medication regimen without talking to [place].  3. Medication reminders can help patients remember to take drugs on time. If you are missing or forgetting doses you can try linking behaviors, using pill boxes, or an electronic reminder like an alarm on your phone or an app. Some people can also get automated phone calls as medication reminders.

## 2018-10-12 NOTE — Patient Instructions (Signed)
Continue weighing daily and call for an overnight weight gain of > 2 pounds or a weekly weight gain of >5 pounds. 

## 2018-11-04 ENCOUNTER — Telehealth: Payer: Self-pay

## 2018-11-04 NOTE — Telephone Encounter (Signed)
Virtual Visit Pre-Appointment Phone Call  Steps For Call:  1. Confirm consent - "In the setting of the current Covid19 crisis, you are scheduled for a phone visit with your provider on 11/16/2018 at 10:30AM.  Just as we do with many in-office visits, in order for you to participate in this visit, we must obtain consent.  If you'd like, I can send this to your mychart (if signed up) or email for you to review.  Otherwise, I can obtain your verbal consent now.  All virtual visits are billed to your insurance company just like a normal visit would be.  By agreeing to a virtual visit, we'd like you to understand that the technology does not allow for your provider to perform an examination, and thus may limit your provider's ability to fully assess your condition.  Finally, though the technology is pretty good, we cannot assure that it will always work on either your or our end, and in the setting of a video visit, we may have to convert it to a phone-only visit.  In either situation, we cannot ensure that we have a secure connection.  Are you willing to proceed?"  2. Give patient instructions for WebEx download to smartphone as below if video visit  3. Advise patient to be prepared with any vital sign or heart rhythm information, their current medicines, and a piece of paper and pen handy for any instructions they may receive the day of their visit  4. Inform patient they will receive a phone call 15 minutes prior to their appointment time (may be from unknown caller ID) so they should be prepared to answer  5. Confirm that appointment type is correct in Epic appointment notes (video vs telephone)    TELEPHONE CALL NOTE  Manuel Rosales has been deemed a candidate for a follow-up tele-health visit to limit community exposure during the Covid-19 pandemic. I spoke with the patient via phone to ensure availability of phone/video source, confirm preferred email & phone number, and discuss  instructions and expectations.  I reminded Manuel Rosales to be prepared with any vital sign and/or heart rhythm information that could potentially be obtained via home monitoring, at the time of his visit. I reminded Manuel Rosales to expect a phone call at the time of his visit if his visit.  Did the patient verbally acknowledge consent to treatment? YES  Manuel Rosales 11/04/2018 10:01 AM   CONSENT FOR TELE-HEALTH VISIT - PLEASE REVIEW  I hereby voluntarily request, consent and authorize [facility] and its employed or contracted physicians, physician assistants, nurse practitioners or other licensed health care professionals (the Practitioner), to provide me with telemedicine health care services (the Services") as deemed necessary by the treating Practitioner. I acknowledge and consent to receive the Services by the Practitioner via telemedicine. I understand that the telemedicine visit will involve communicating with the Practitioner through live audiovisual communication technology and the disclosure of certain medical information by electronic transmission. I acknowledge that I have been given the opportunity to request an in-person assessment or other available alternative prior to the telemedicine visit and am voluntarily participating in the telemedicine visit.  I understand that I have the right to withhold or withdraw my consent to the use of telemedicine in the course of my care at any time, without affecting my right to future care or treatment, and that the Practitioner or I may terminate the telemedicine visit at any time. I understand that I  have the right to inspect all information obtained and/or recorded in the course of the telemedicine visit and may receive copies of available information for a reasonable fee.  I understand that some of the potential risks of receiving the Services via telemedicine include:   Delay or interruption in medical evaluation due to  technological equipment failure or disruption;  Information transmitted may not be sufficient (e.g. poor resolution of images) to allow for appropriate medical decision making by the Practitioner; and/or   In rare instances, security protocols could fail, causing a breach of personal health information.  Furthermore, I acknowledge that it is my responsibility to provide information about my medical history, conditions and care that is complete and accurate to the best of my ability. I acknowledge that Practitioner's advice, recommendations, and/or decision may be based on factors not within their control, such as incomplete or inaccurate data provided by me or distortions of diagnostic images or specimens that may result from electronic transmissions. I understand that the practice of medicine is not an exact science and that Practitioner makes no warranties or guarantees regarding treatment outcomes. I acknowledge that I will receive a copy of this consent concurrently upon execution via email to the email address I last provided but may also request a printed copy by calling the office of [facility].    I understand that my insurance will be billed for this visit.   I have read or had this consent read to me.  I understand the contents of this consent, which adequately explains the benefits and risks of the Services being provided via telemedicine.   I have been provided ample opportunity to ask questions regarding this consent and the Services and have had my questions answered to my satisfaction.  I give my informed consent for the services to be provided through the use of telemedicine in my medical care.  By participating in this telemedicine visit I agree to the above.

## 2018-11-15 NOTE — Progress Notes (Deleted)
{Choose 1 Note Type (Telehealth Visit or Telephone Visit):[phone]}   Evaluation Performed:  Follow-up visit  Date:  11/15/2018   ID:  Manuel Rosales, DOB 1965-09-04, MRN [id number]  Patient Location: Home  Provider Location: Home  [facility]P:  [facility]  Cardiologist:  Lorine BearsMuhammad Arida, MD  Electrophysiologist:  None   Chief Complaint:  Telehealth follow up  History of Present Illness:    Manuel Rosales is a 53 y.o. male who presents via audio/video conferencing for a telehealth visit today.  He has a history of CAD s/p NSTEMI in 06/2017 in the setting of an abdominal abscess, chronic systolic CHF, pulmonary HTN, HIV, ARF, HTN, and tobacco abuse.  He was admitted in 06/2017 with SOB, orthopnea, and abdominal swelling with poorly controlled hypertension. CT abdomen and pelvis showed fluid collection involving the right psoas/ileus muscles. His troponin peaked at 1.87. Echo 06/09/17, showed an EF of 20-25% , diffuse hypokinesis, mild to moderate tricuspid regurgitation, and mild pulmonary hypertension. Cardiac catheterization was not performed due to active infection and acute renal failure. He underwent CT-guided drainage of the abscess and was treated with antibiotics. He was seen in the ED on 06/29/17 with worsening SOB. Troponin was mildly elevated a 0.04 and not trended. BUN/SCr 27/1.06, K+ 3.9, albumin 2.9. CTA chest negative for PE, though there was concern for bilateral upper lobe PNA with trace bilateral pleural effusions. He was seen by Dr. Kirke CorinArida on 07/01/17 and noted worsening orthopnea and PND. His weight had increased from 146-->160 pounds. He was felt to be volume overloaded and Lasix 20 mg once daily was added. In follow up with [facility], he remained volume overloaded and his Lasix was changed to torsemide. Follow up labs showed acute renal failure with a peak SCr of 3.58 leading his torsemide to be decreased by the CHF Clinic. He returned to our  office in early 08/2017 and was felt to be euvolemic. Follow up labs showed his renal function recovered to around to 2 range. He was referred to nephrology for management of his renal failure. His weight had trended up, though he attributed this to improved appetite. Since he was last seen by us in 10/2017, he has been followed by [facility]. He was seen in 08/2018 by them with a weight noted to be up to 200 pounds. Last weight at our office noted to be 164 pounds. No changes were made at his 08/2018 visit by outside office. Echo was performed for insurance purposes in 08/2018 that showed improvement in his EF to 50-55%, normal diastolic function, unable to estimate PASP, trivial pericardial effusion was noted posterior to the heart. He was most recently seen by [facility] in 10/2018 with improvement in his weight to 177 pounds. He reported his scales had broke and a new one was given to him. He also reported being diagnosed with DM.   Labs: 10/2017 - SCr 2.18, K+ 5.3  ***  The patient {does/does not:200015} have symptoms concerning for COVID-19 infection (fever, chills, cough, or new shortness of breath).    Past Medical History:  Diagnosis Date  . Chronic systolic CHF (congestive heart failure) (HCC) 06/2017   a. TTE 11/18: EF of 20-25% , difuse hypokinesis, mild to moderate tricuspid regurgitation, and mild pulmonary hypertension  . Coronary artery disease 06/2017   Non-ST elevation myocardial infarction.  Treated medically with no cardiac cath done due to active infection and renal failure.  . Dermatitis   .  HIV (human immunodeficiency virus infection) (HCC)   . Hypertension   . Pulmonary hypertension (HCC)    a. TTE 11/18: EF of 20-25% , difuse hypokinesis, mild to moderate tricuspid regurgitation, and mild pulmonary hypertension w/ PASP 40 mmHg   Past Surgical History:  Procedure Laterality Date  . APPENDECTOMY    . IR RADIOLOGIST EVAL & MGMT  06/19/2017      No outpatient medications have been marked as taking for the 11/16/18 encounter (Appointment) with Sondra Barges, PA-C.     Allergies:   Patient has no known allergies.   Social History   Tobacco Use  . Smoking status: Current Some Day Smoker    Packs/day: 0.50    Types: Cigarettes    Last attempt to quit: 06/11/2017    Years since quitting: 1.4  . Smokeless tobacco: Never Used  . Tobacco comment: 02/17/2018- Smokes 2 cigarettes a day  Substance Use Topics  . Alcohol use: Yes    Alcohol/week: 2.0 standard drinks    Types: 2 Cans of beer per week    Frequency: Never  . Drug use: Not Currently    Types: Marijuana    Comment: Quit a while ago     Family Hx: The patient's family history includes Cerebral aneurysm in his brother; Diabetes in his mother; Heart attack in his father; Hypertension in his mother.  ROS:   Please see the history of present illness.    *** All other systems reviewed and are negative.   Prior CV studies:   The following studies were reviewed today:  2D Echo 08/2018: - Left ventricle: The cavity size was normal. Wall thickness was   normal. Systolic function was normal. The estimated ejection   fraction was in the range of 50% to 55%. Left ventricular   diastolic function parameters were normal. - Right ventricle: The cavity size was normal. Wall thickness was   normal. Systolic function was normal. - Pulmonary arteries: Systolic pressure could not be accurately   estimated. - Pericardium, extracardiac: A trivial pericardial effusion was   identified posterior to the heart. __________  Labs/Other Tests and Data Reviewed:    EKG:  {GUR:[id number]}  Recent Labs: No results found for requested labs within last 8760 hours.   Recent Lipid Panel Lab Results  Component Value Date/Time   CHOL 123 06/09/2017 12:22 PM   TRIG 94 06/09/2017 12:22 PM   HDL 21 (L) 06/09/2017 12:22 PM   CHOLHDL 5.9 06/09/2017 12:22 PM   LDLCALC 83 06/09/2017 12:22 PM     Wt Readings from Last 3 Encounters:  10/12/18 177 lb 8 oz (80.5 kg)  08/12/18 200 lb 4 oz (90.8 kg)  02/17/18 186 lb 6 oz (84.5 kg)     Objective:    Vital Signs:  There were no vitals taken for this visit.   Well nourished, well developed male in no acute distress. ***  ASSESSMENT & PLAN:    1. ***  COVID-19 Education: The signs and symptoms of COVID-19 were discussed with the patient and how to seek care for testing (follow up with PCP or arrange E-visit).  ***The importance of social distancing was discussed today.  Time:   Today, I have spent *** minutes with the patient with telehealth technology discussing the above problems.     Medication Adjustments/Labs and Tests Ordered: Current medicines are reviewed at length with the patient today.  Concerns regarding medicines are outlined above.  Tests Ordered: No orders of the defined types were  placed in this encounter.  Medication Changes: No orders of the defined types were placed in this encounter.   Disposition:  Follow up {follow up:15908}  Signed, Eula Listen, PA-C  11/15/2018 11:22 AM    [facility]

## 2018-11-16 ENCOUNTER — Other Ambulatory Visit: Payer: Self-pay

## 2018-11-16 ENCOUNTER — Telehealth: Payer: Medicaid Other | Admitting: Physician Assistant

## 2019-02-08 ENCOUNTER — Ambulatory Visit: Payer: Medicaid Other | Admitting: Family

## 2019-02-18 NOTE — Progress Notes (Deleted)
Patient ID: Manuel Rosales, male    DOB: 07-Mar-1966, 53 y.o.   MRN: [id number]  HPI  Manuel Rosales is a 53 y/o male with a history of HTN, HIV, psoas abscess, current tobacco use and chronic heart failure.   Echo report from 08/25/2018 reviewed and showed an EF of 50-55%. Echo report from 06/09/17 reviewed and shows an EF of 20-25% along with mild/mod TR and mildly elevated PA pressure of 40 mm Hg.   Has not been in the ED or admitted in the last 6 months.    He presents today for a follow-up visit with a chief complaint of   Past Medical History:  Diagnosis Date  . Chronic systolic CHF (congestive heart failure) (HCC) 06/2017   a. TTE 11/18: EF of 20-25% , difuse hypokinesis, mild to moderate tricuspid regurgitation, and mild pulmonary hypertension  . Coronary artery disease 06/2017   Non-ST elevation myocardial infarction.  Treated medically with no cardiac cath done due to active infection and renal failure.  . Dermatitis   . HIV (human immunodeficiency virus infection) (HCC)   . Hypertension   . Pulmonary hypertension (HCC)    a. TTE 11/18: EF of 20-25% , difuse hypokinesis, mild to moderate tricuspid regurgitation, and mild pulmonary hypertension w/ PASP 40 mmHg   Past Surgical History:  Procedure Laterality Date  . APPENDECTOMY    . IR RADIOLOGIST EVAL & MGMT  06/19/2017   Family History  Problem Relation Age of Onset  . Cerebral aneurysm Brother        died  . Hypertension Mother   . Diabetes Mother   . Heart attack Father    Social History   Tobacco Use  . Smoking status: Current Some Day Smoker    Packs/day: 0.50    Types: Cigarettes    Last attempt to quit: 06/11/2017    Years since quitting: 1.6  . Smokeless tobacco: Never Used  . Tobacco comment: 02/17/2018- Smokes 2 cigarettes a day  Substance Use Topics  . Alcohol use: Yes    Alcohol/week: 2.0 standard drinks    Types: 2 Cans of beer per week    Frequency: Never   No Known Allergies  Past Medical  History:  Diagnosis Date  . Chronic systolic CHF (congestive heart failure) (HCC) 06/2017   a. TTE 11/18: EF of 20-25% , difuse hypokinesis, mild to moderate tricuspid regurgitation, and mild pulmonary hypertension  . Coronary artery disease 06/2017   Non-ST elevation myocardial infarction.  Treated medically with no cardiac cath done due to active infection and renal failure.  . Dermatitis   . HIV (human immunodeficiency virus infection) (HCC)   . Hypertension   . Pulmonary hypertension (HCC)    a. TTE 11/18: EF of 20-25% , difuse hypokinesis, mild to moderate tricuspid regurgitation, and mild pulmonary hypertension w/ PASP 40 mmHg   Past Surgical History:  Procedure Laterality Date  . APPENDECTOMY    . IR RADIOLOGIST EVAL & MGMT  06/19/2017   Family History  Problem Relation Age of Onset  . Cerebral aneurysm Brother        died  . Hypertension Mother   . Diabetes Mother   . Heart attack Father    Social History   Tobacco Use  . Smoking status: Current Some Day Smoker    Packs/day: 0.50    Types: Cigarettes    Last attempt to quit: 06/11/2017    Years since quitting: 1.6  . Smokeless tobacco: Never Used  .  Tobacco comment: 02/17/2018- Smokes 2 cigarettes a day  Substance Use Topics  . Alcohol use: Yes    Alcohol/week: 2.0 standard drinks    Types: 2 Cans of beer per week    Frequency: Never   No Known Allergies    Review of Systems  Constitutional: Positive for fatigue (minimal). Negative for appetite change.  HENT: Negative for congestion, postnasal drip and sore throat.   Eyes: Negative.   Respiratory: Positive for cough. Negative for chest tightness and shortness of breath.   Cardiovascular: Negative for chest pain, palpitations and leg swelling.  Gastrointestinal: Negative for abdominal distention and abdominal pain.  Endocrine: Negative.   Genitourinary: Negative.   Musculoskeletal: Negative for back pain and neck pain.  Skin: Negative.    Allergic/Immunologic: Negative.   Neurological: Negative for dizziness and light-headedness.  Hematological: Negative for adenopathy. Does not bruise/bleed easily.  Psychiatric/Behavioral: Negative for dysphoric mood and sleep disturbance. The patient is not nervous/anxious.      Physical Exam  Constitutional: He is oriented to person, place, and time. He appears well-developed and well-nourished.  HENT:  Head: Normocephalic and atraumatic.  Neck: Normal range of motion. Neck supple. No JVD present.  Cardiovascular: Normal rate and regular rhythm.  Pulmonary/Chest: Effort normal. He has no wheezes. He has no rales.  Abdominal: He exhibits no distension. There is no abdominal tenderness.  Musculoskeletal:        General: No tenderness or edema.  Neurological: He is alert and oriented to person, place, and time.  Skin: Skin is warm and dry.  Psychiatric: He has a normal mood and affect. His behavior is normal. Thought content normal.  Nursing note and vitals reviewed.  Assessment & Plan:  1: Chronic heart failure with preserved ejection fraction- - NYHA class II - euvolemic today -  weighing daily; reminded to call for an overnight weight gain of >2 pounds or a weekly weight gain of >5 pounds - weight 177.8pounds from last visit 4 months ago  - not adding salt and is using Mrs Deliah Boston for seasoning. Reminded to closely follow a 2000mg  sodium diet - saw cardiology ([facility]) 10/03/17 - drinking ~56 ounces of fluid daily   2: HTN- - BP  - BMP from 10/03/17 reviewed and showed sodium 138, potassium 5.3 and GFR 39  - follows at [facility]  3: Tobacco use- - smoking 3-5 cigarettes daily - complete cessation discussed for 3 minutes with patient   Patient did not bring his medications nor a list. Each medication was verbally reviewed with the patient and he was encouraged to bring the bottles to every visit to confirm accuracy of list.

## 2019-02-19 ENCOUNTER — Ambulatory Visit: Payer: Medicaid Other | Admitting: Family

## 2019-02-19 ENCOUNTER — Telehealth: Payer: Self-pay | Admitting: Family

## 2019-02-19 NOTE — Telephone Encounter (Signed)
Patient did not show for his Heart Failure Clinic appointment on 02/19/2019. Will attempt to reschedule.  

## 2019-07-05 ENCOUNTER — Other Ambulatory Visit: Payer: Self-pay | Admitting: Family

## 2019-07-06 ENCOUNTER — Other Ambulatory Visit: Payer: Self-pay | Admitting: Family

## 2019-07-06 MED ORDER — SACUBITRIL-VALSARTAN 24-26 MG PO TABS: 1.0000 | ORAL_TABLET | Freq: Two times a day (BID) | ORAL | 3 refills | Status: DC

## 2019-07-20 NOTE — Progress Notes (Signed)
Patient ID: Manuel Rosales, male    DOB: [birth year], 53 y.o.   MRN: [id number]  HPI  Manuel Rosales is a 53 y/o male with a history of HTN, HIV, psoas abscess, current tobacco use and chronic heart failure.   Echo report from 08/25/2018 reviewed and showed an EF of 50-55%. Echo report from 06/09/17 reviewed and shows an EF of 20-25% along with mild/mod TR and mildly elevated PA pressure of 40 mm Hg.   Has not been in the ED or admitted in the last 6 months.    He presents today for a follow-up visit with a chief complaint of minimal fatigue upon moderate exertion. He describes this as chronic in nature having been present for several years. He has associated cough and gradual weight gain along with this. He denies any difficulty sleeping, dizziness, abdominal distention, palpitations, pedal edema, chest pain or shortness of breath.   Past Medical History:  Diagnosis Date  . Chronic systolic CHF (congestive heart failure) ([place]) 06/2017   a. TTE 11/18: EF of 20-25% , difuse hypokinesis, mild to moderate tricuspid regurgitation, and mild pulmonary hypertension  . Coronary artery disease 06/2017   Non-ST elevation myocardial infarction.  Treated medically with no cardiac cath done due to active infection and renal failure.  . Dermatitis   . HIV (human immunodeficiency virus infection) ([place])   . Hypertension   . Pulmonary hypertension ([place])    a. TTE 11/18: EF of 20-25% , difuse hypokinesis, mild to moderate tricuspid regurgitation, and mild pulmonary hypertension w/ PASP 40 mmHg   Past Surgical History:  Procedure Laterality Date  . APPENDECTOMY    . IR RADIOLOGIST EVAL & MGMT  06/19/2017   Family History  Problem Relation Age of Onset  . Cerebral aneurysm Brother        died  . Hypertension Mother   . Diabetes Mother   . Heart attack Father    Social History   Tobacco Use  . Smoking status: Current Some Day Smoker    Packs/day: 0.50    Types: Cigarettes    Last attempt to quit:  06/11/2017    Years since quitting: 2.1  . Smokeless tobacco: Never Used  . Tobacco comment: 02/17/2018- Smokes 2 cigarettes a day  Substance Use Topics  . Alcohol use: Yes    Alcohol/week: 2.0 standard drinks    Types: 2 Cans of beer per week   No Known Allergies  Past Medical History:  Diagnosis Date  . Chronic systolic CHF (congestive heart failure) ([place]) 06/2017   a. TTE 11/18: EF of 20-25% , difuse hypokinesis, mild to moderate tricuspid regurgitation, and mild pulmonary hypertension  . Coronary artery disease 06/2017   Non-ST elevation myocardial infarction.  Treated medically with no cardiac cath done due to active infection and renal failure.  . Dermatitis   . HIV (human immunodeficiency virus infection) ([place])   . Hypertension   . Pulmonary hypertension ([place])    a. TTE 11/18: EF of 20-25% , difuse hypokinesis, mild to moderate tricuspid regurgitation, and mild pulmonary hypertension w/ PASP 40 mmHg   Past Surgical History:  Procedure Laterality Date  . APPENDECTOMY    . IR RADIOLOGIST EVAL & MGMT  06/19/2017   Family History  Problem Relation Age of Onset  . Cerebral aneurysm Brother        died  . Hypertension Mother   . Diabetes Mother   . Heart attack Father    Social History   Tobacco Use  .  Smoking status: Current Some Day Smoker    Packs/day: 0.50    Types: Cigarettes    Last attempt to quit: 06/11/2017    Years since quitting: 2.1  . Smokeless tobacco: Never Used  . Tobacco comment: 02/17/2018- Smokes 2 cigarettes a day  Substance Use Topics  . Alcohol use: Yes    Alcohol/week: 2.0 standard drinks    Types: 2 Cans of beer per week   No Known Allergies  Prior to Admission medications   Medication Sig Start Date End Date Taking? Authorizing Provider  aspirin 81 MG EC tablet Take 1 tablet (81 mg total) by mouth daily. 06/24/18  Yes Kendrick Haapala, Inetta Fermo A, FNP  atorvastatin (LIPITOR) 40 MG tablet Take 1 tablet (40 mg total) by mouth daily at 6 PM. MUST MAKE  APPOINTMENT FOR FURTHER REFILLS 07/05/19  Yes Clarisa Kindred A, FNP  bictegravir-emtricitabine-tenofovir AF (BIKTARVY) 50-200-25 MG TABS tablet Take 1 tablet by mouth daily.   Yes [provider]  carvedilol (COREG) 6.25 MG tablet Take 1 tablet (6.25 mg total) by mouth 2 (two) times daily. 07/21/19  Yes Iyauna Sing A, FNP  glipiZIDE (GLUCOTROL) 5 MG tablet Take 5 mg by mouth daily before breakfast.   Yes [provider]  metFORMIN (GLUCOPHAGE) 1000 MG tablet Take 1,000 mg by mouth 2 (two) times daily with a meal.   Yes [provider]  nitroGLYCERIN (NITROSTAT) 0.4 MG SL tablet Place 1 tablet (0.4 mg total) under the tongue every 5 (five) minutes as needed for chest pain. 06/24/18  Yes Siaosi Alter A, FNP  sacubitril-valsartan (ENTRESTO) 24-26 MG Take 1 tablet by mouth 2 (two) times daily. 07/06/19  Yes Dian Laprade, Inetta Fermo A, FNP  torsemide (DEMADEX) 20 MG tablet Take 1 tablet (20 mg total) by mouth daily. May take 1 additional tablet (20 mg) as needed for shortness of breath/weight gain of 3 lbs. 07/21/19  Yes Delma Freeze, FNP     Review of Systems  Constitutional: Positive for fatigue (minimal). Negative for appetite change.  HENT: Negative for congestion, postnasal drip and sore throat.   Eyes: Negative.   Respiratory: Positive for cough. Negative for chest tightness and shortness of breath.   Cardiovascular: Negative for chest pain, palpitations and leg swelling.  Gastrointestinal: Negative for abdominal distention and abdominal pain.  Endocrine: Negative.   Genitourinary: Negative.   Musculoskeletal: Negative for back pain and neck pain.  Skin: Negative.   Allergic/Immunologic: Negative.   Neurological: Negative for dizziness and light-headedness.  Hematological: Negative for adenopathy. Does not bruise/bleed easily.  Psychiatric/Behavioral: Negative for dysphoric mood and sleep disturbance. The patient is not nervous/anxious.    Vitals:   07/21/19 1320   BP: 124/81  Pulse: 82  Resp: 20  SpO2: 100%  Weight: 183 lb 3.2 oz (83.1 kg)  Height: 6\' 1"  (1.854 m)   Wt Readings from Last 3 Encounters:  07/21/19 183 lb 3.2 oz (83.1 kg)  10/12/18 177 lb 8 oz (80.5 kg)  08/12/18 200 lb 4 oz (90.8 kg)   Lab Results  Component Value Date   CREATININE 2.18 (H) 10/03/2017   CREATININE 2.34 (H) 09/03/2017   CREATININE 2.36 (H) 09/02/2017    Physical Exam  Constitutional: He is oriented to person, place, and time. He appears well-developed and well-nourished.  HENT:  Head: Normocephalic and atraumatic.  Neck: No JVD present.  Cardiovascular: Normal rate and regular rhythm.  Pulmonary/Chest: Effort normal. He has no wheezes. He has no rales.  Abdominal: He exhibits no distension. There  is no abdominal tenderness.  Musculoskeletal:        General: No tenderness or edema.     Cervical back: Normal range of motion and neck supple.  Neurological: He is alert and oriented to person, place, and time.  Skin: Skin is warm and dry.  Psychiatric: He has a normal mood and affect. His behavior is normal. Thought content normal.  Nursing note and vitals reviewed.  Assessment & Plan:  1: Chronic heart failure with now preserved ejection fraction- - NYHA class II - euvolemic today - weighing daily; he was reminded to call for an overnight weight gain of >2 pounds or a weekly weight gain of >5 pounds - weight up 6 pounds from last visit 9 months ago - not adding salt and is using Mrs Sharilyn SitesDash for seasoning. Reminded to closely follow a 2000mg  sodium diet - saw cardiology ([facility]) 10/03/17 - drinking ~56 ounces of fluid daily - says that he's received his flu and pneumonia vaccines for this season  2: HTN- - BP looks good today - BMP from 10/03/17 reviewed and showed sodium 138, potassium 5.3 and GFR 39  - [facility] and says that he was seen there last week and had lab work done  3: Tobacco use- - smoking 3-5 cigarettes daily -  complete cessation discussed for 3 minutes with patient  4: Diabetes- - nonfasting glucose in clinic today was 115    Patient did not bring his medications nor a list. Each medication was verbally reviewed with the patient and he was encouraged to bring the bottles to every visit to confirm accuracy of list.  Return in 3 months or sooner for any questions/problems before then.

## 2019-07-21 ENCOUNTER — Other Ambulatory Visit: Payer: Self-pay

## 2019-07-21 ENCOUNTER — Encounter: Payer: Self-pay | Admitting: Family

## 2019-07-21 ENCOUNTER — Ambulatory Visit: Payer: Medicaid Other | Attending: Family | Admitting: Family

## 2019-07-21 VITALS — BP 124/81 | HR 82 | Resp 20 | Ht 73.0 in | Wt 183.2 lb

## 2019-07-21 DIAGNOSIS — Z21 Asymptomatic human immunodeficiency virus [HIV] infection status: Secondary | ICD-10-CM | POA: Insufficient documentation

## 2019-07-21 DIAGNOSIS — F17210 Nicotine dependence, cigarettes, uncomplicated: Secondary | ICD-10-CM | POA: Diagnosis not present

## 2019-07-21 DIAGNOSIS — I5032 Chronic diastolic (congestive) heart failure: Secondary | ICD-10-CM

## 2019-07-21 DIAGNOSIS — I272 Pulmonary hypertension, unspecified: Secondary | ICD-10-CM | POA: Diagnosis not present

## 2019-07-21 DIAGNOSIS — I252 Old myocardial infarction: Secondary | ICD-10-CM | POA: Diagnosis not present

## 2019-07-21 DIAGNOSIS — I2510 Atherosclerotic heart disease of native coronary artery without angina pectoris: Secondary | ICD-10-CM | POA: Insufficient documentation

## 2019-07-21 DIAGNOSIS — I10 Essential (primary) hypertension: Secondary | ICD-10-CM

## 2019-07-21 DIAGNOSIS — Z833 Family history of diabetes mellitus: Secondary | ICD-10-CM | POA: Diagnosis not present

## 2019-07-21 DIAGNOSIS — E119 Type 2 diabetes mellitus without complications: Secondary | ICD-10-CM | POA: Diagnosis not present

## 2019-07-21 DIAGNOSIS — I5022 Chronic systolic (congestive) heart failure: Secondary | ICD-10-CM | POA: Insufficient documentation

## 2019-07-21 DIAGNOSIS — Z720 Tobacco use: Secondary | ICD-10-CM

## 2019-07-21 DIAGNOSIS — I110 Hypertensive heart disease with heart failure: Secondary | ICD-10-CM | POA: Diagnosis not present

## 2019-07-21 DIAGNOSIS — Z8249 Family history of ischemic heart disease and other diseases of the circulatory system: Secondary | ICD-10-CM | POA: Diagnosis not present

## 2019-07-21 DIAGNOSIS — Z7982 Long term (current) use of aspirin: Secondary | ICD-10-CM | POA: Insufficient documentation

## 2019-07-21 DIAGNOSIS — Z7984 Long term (current) use of oral hypoglycemic drugs: Secondary | ICD-10-CM | POA: Diagnosis not present

## 2019-07-21 DIAGNOSIS — E1122 Type 2 diabetes mellitus with diabetic chronic kidney disease: Secondary | ICD-10-CM

## 2019-07-21 DIAGNOSIS — Z79899 Other long term (current) drug therapy: Secondary | ICD-10-CM | POA: Diagnosis not present

## 2019-07-21 LAB — GLUCOSE, CAPILLARY: Glucose-Capillary: 115 mg/dL — ABNORMAL HIGH (ref 70–99)

## 2019-07-21 MED ORDER — CARVEDILOL 6.25 MG PO TABS: 6.2500 mg | ORAL_TABLET | Freq: Two times a day (BID) | ORAL | 3 refills | Status: DC

## 2019-07-21 MED ORDER — TORSEMIDE 20 MG PO TABS: 20.0000 mg | ORAL_TABLET | Freq: Every day | ORAL | 3 refills | Status: DC

## 2019-07-21 NOTE — Patient Instructions (Signed)
Continue weighing daily and call for an overnight weight gain of > 2 pounds or a weekly weight gain of >5 pounds. 

## 2019-09-05 ENCOUNTER — Other Ambulatory Visit: Payer: Self-pay | Admitting: Family

## 2019-10-18 NOTE — Progress Notes (Deleted)
Patient ID: Manuel Rosales, male    DOB: Dec 29, 1965, 54 y.o.   MRN: [id number]  HPI  Manuel Rosales is a 54 y/o male with a history of HTN, HIV, psoas abscess, current tobacco use and chronic heart failure.   Echo report from 08/25/2018 reviewed and showed an EF of 50-55%. Echo report from 06/09/17 reviewed and shows an EF of 20-25% along with mild/mod TR and mildly elevated PA pressure of 40 mm Hg.   Has not been in the ED or admitted in the last 6 months.    He presents today for a follow-up visit with a chief complaint of   Past Medical History:  Diagnosis Date  . Chronic systolic CHF (congestive heart failure) ([place]) 06/2017   a. TTE 11/18: EF of 20-25% , difuse hypokinesis, mild to moderate tricuspid regurgitation, and mild pulmonary hypertension  . Coronary artery disease 06/2017   Non-ST elevation myocardial infarction.  Treated medically with no cardiac cath done due to active infection and renal failure.  . Dermatitis   . HIV (human immunodeficiency virus infection) ([place])   . Hypertension   . Pulmonary hypertension ([place])    a. TTE 11/18: EF of 20-25% , difuse hypokinesis, mild to moderate tricuspid regurgitation, and mild pulmonary hypertension w/ PASP 40 mmHg   Past Surgical History:  Procedure Laterality Date  . APPENDECTOMY    . IR RADIOLOGIST EVAL & MGMT  06/19/2017   Family History  Problem Relation Age of Onset  . Cerebral aneurysm Brother        died  . Hypertension Mother   . Diabetes Mother   . Heart attack Father    Social History   Tobacco Use  . Smoking status: Current Some Day Smoker    Packs/day: 0.50    Types: Cigarettes    Last attempt to quit: 06/11/2017    Years since quitting: 2.3  . Smokeless tobacco: Never Used  . Tobacco comment: 02/17/2018- Smokes 2 cigarettes a day  Substance Use Topics  . Alcohol use: Yes    Alcohol/week: 2.0 standard drinks    Types: 2 Cans of beer per week   No Known Allergies  Past Medical History:  Diagnosis Date   . Chronic systolic CHF (congestive heart failure) ([place]) 06/2017   a. TTE 11/18: EF of 20-25% , difuse hypokinesis, mild to moderate tricuspid regurgitation, and mild pulmonary hypertension  . Coronary artery disease 06/2017   Non-ST elevation myocardial infarction.  Treated medically with no cardiac cath done due to active infection and renal failure.  . Dermatitis   . HIV (human immunodeficiency virus infection) ([place])   . Hypertension   . Pulmonary hypertension ([place])    a. TTE 11/18: EF of 20-25% , difuse hypokinesis, mild to moderate tricuspid regurgitation, and mild pulmonary hypertension w/ PASP 40 mmHg   Past Surgical History:  Procedure Laterality Date  . APPENDECTOMY    . IR RADIOLOGIST EVAL & MGMT  06/19/2017   Family History  Problem Relation Age of Onset  . Cerebral aneurysm Brother        died  . Hypertension Mother   . Diabetes Mother   . Heart attack Father    Social History   Tobacco Use  . Smoking status: Current Some Day Smoker    Packs/day: 0.50    Types: Cigarettes    Last attempt to quit: 06/11/2017    Years since quitting: 2.3  . Smokeless tobacco: Never Used  . Tobacco comment: 02/17/2018- Smokes 2  cigarettes a day  Substance Use Topics  . Alcohol use: Yes    Alcohol/week: 2.0 standard drinks    Types: 2 Cans of beer per week   No Known Allergies     Review of Systems  Constitutional: Positive for fatigue (minimal). Negative for appetite change.  HENT: Negative for congestion, postnasal drip and sore throat.   Eyes: Negative.   Respiratory: Positive for cough. Negative for chest tightness and shortness of breath.   Cardiovascular: Negative for chest pain, palpitations and leg swelling.  Gastrointestinal: Negative for abdominal distention and abdominal pain.  Endocrine: Negative.   Genitourinary: Negative.   Musculoskeletal: Negative for back pain and neck pain.  Skin: Negative.   Allergic/Immunologic: Negative.   Neurological: Negative for  dizziness and light-headedness.  Hematological: Negative for adenopathy. Does not bruise/bleed easily.  Psychiatric/Behavioral: Negative for dysphoric mood and sleep disturbance. The patient is not nervous/anxious.     Physical Exam  Constitutional: He is oriented to person, place, and time. He appears well-developed and well-nourished.  HENT:  Head: Normocephalic and atraumatic.  Neck: No JVD present.  Cardiovascular: Normal rate and regular rhythm.  Pulmonary/Chest: Effort normal. He has no wheezes. He has no rales.  Abdominal: He exhibits no distension. There is no abdominal tenderness.  Musculoskeletal:        General: No tenderness or edema.     Cervical back: Normal range of motion and neck supple.  Neurological: He is alert and oriented to person, place, and time.  Skin: Skin is warm and dry.  Psychiatric: He has a normal mood and affect. His behavior is normal. Thought content normal.  Nursing note and vitals reviewed.  Assessment & Plan:  1: Chronic heart failure with now preserved ejection fraction- - NYHA class II - euvolemic today - weighing daily; he was reminded to call for an overnight weight gain of >2 pounds or a weekly weight gain of >5 pounds - weight 183.3 pounds from last visit here 3 months ago - not adding salt and is using Mrs Sharilyn Sites for seasoning. Reminded to closely follow a 2000mg  sodium diet - saw cardiology ([facility]) 10/03/17 - drinking ~56 ounces of fluid daily - says that he's received his flu and pneumonia vaccines for this season  2: HTN- - BP - BMP from 10/03/17 reviewed and showed sodium 138, potassium 5.3 and GFR 39  - follows at [facility] and says that he was seen there last week and had lab work done  3: Tobacco use- - smoking 3-5 cigarettes daily - complete cessation discussed for 3 minutes with patient  4: Diabetes- - nonfasting glucose in clinic today was     Patient did not bring his medications nor a list. Each  medication was verbally reviewed with the patient and he was encouraged to bring the bottles to every visit to confirm accuracy of list.

## 2019-10-19 ENCOUNTER — Telehealth: Payer: Self-pay | Admitting: Family

## 2019-10-19 ENCOUNTER — Ambulatory Visit: Payer: Medicaid Other | Admitting: Family

## 2019-10-19 NOTE — Telephone Encounter (Signed)
Patient did not show for his Heart Failure Clinic appointment on 10/19/19. Will attempt to reschedule.

## 2020-06-09 ENCOUNTER — Other Ambulatory Visit: Payer: Self-pay | Admitting: Registered Nurse

## 2020-07-09 ENCOUNTER — Other Ambulatory Visit: Payer: Self-pay | Admitting: Family

## 2020-08-07 ENCOUNTER — Other Ambulatory Visit: Payer: Self-pay | Admitting: Family

## 2020-08-10 ENCOUNTER — Other Ambulatory Visit: Payer: Self-pay | Admitting: Family

## 2020-08-11 ENCOUNTER — Other Ambulatory Visit: Payer: Self-pay | Admitting: Family

## 2020-08-11 MED ORDER — ENTRESTO 24-26 MG PO TABS: 1.0000 | ORAL_TABLET | Freq: Two times a day (BID) | ORAL | 0 refills | Status: DC

## 2020-08-11 NOTE — Progress Notes (Signed)
Entresto refilled for 1 month without refills. Advised that he MUST keep his next appt with the HF Clinic on 08/31/19 for further refills. Patient verbalized understanding.

## 2020-08-30 ENCOUNTER — Encounter: Payer: Self-pay | Admitting: Family

## 2020-08-30 ENCOUNTER — Ambulatory Visit: Payer: Medicaid Other | Attending: Family | Admitting: Family

## 2020-08-30 ENCOUNTER — Other Ambulatory Visit: Payer: Self-pay

## 2020-08-30 VITALS — BP 141/89 | HR 73 | Resp 20 | Ht 73.0 in | Wt 171.5 lb

## 2020-08-30 DIAGNOSIS — I110 Hypertensive heart disease with heart failure: Secondary | ICD-10-CM | POA: Diagnosis present

## 2020-08-30 DIAGNOSIS — K6812 Psoas muscle abscess: Secondary | ICD-10-CM | POA: Diagnosis not present

## 2020-08-30 DIAGNOSIS — Z716 Tobacco abuse counseling: Secondary | ICD-10-CM | POA: Diagnosis not present

## 2020-08-30 DIAGNOSIS — Z21 Asymptomatic human immunodeficiency virus [HIV] infection status: Secondary | ICD-10-CM | POA: Insufficient documentation

## 2020-08-30 DIAGNOSIS — F17210 Nicotine dependence, cigarettes, uncomplicated: Secondary | ICD-10-CM | POA: Insufficient documentation

## 2020-08-30 DIAGNOSIS — Z8249 Family history of ischemic heart disease and other diseases of the circulatory system: Secondary | ICD-10-CM | POA: Insufficient documentation

## 2020-08-30 DIAGNOSIS — I10 Essential (primary) hypertension: Secondary | ICD-10-CM

## 2020-08-30 DIAGNOSIS — I5022 Chronic systolic (congestive) heart failure: Secondary | ICD-10-CM | POA: Insufficient documentation

## 2020-08-30 DIAGNOSIS — Z7984 Long term (current) use of oral hypoglycemic drugs: Secondary | ICD-10-CM | POA: Insufficient documentation

## 2020-08-30 DIAGNOSIS — I5032 Chronic diastolic (congestive) heart failure: Secondary | ICD-10-CM

## 2020-08-30 DIAGNOSIS — Z720 Tobacco use: Secondary | ICD-10-CM

## 2020-08-30 DIAGNOSIS — Z7982 Long term (current) use of aspirin: Secondary | ICD-10-CM | POA: Diagnosis not present

## 2020-08-30 DIAGNOSIS — I252 Old myocardial infarction: Secondary | ICD-10-CM | POA: Diagnosis not present

## 2020-08-30 DIAGNOSIS — Z79899 Other long term (current) drug therapy: Secondary | ICD-10-CM | POA: Diagnosis not present

## 2020-08-30 MED ORDER — ENTRESTO 24-26 MG PO TABS: 1.0000 | ORAL_TABLET | Freq: Two times a day (BID) | ORAL | 5 refills | Status: DC

## 2020-08-30 NOTE — Progress Notes (Addendum)
Patient ID: Manuel Rosales, male    DOB: Sep 18, 1965, 55 y.o.   MRN: [id number]  HPI  Manuel Rosales is a 55 y/o male with a history of HTN, HIV, psoas abscess, current tobacco use and chronic heart failure.   Echo report from 08/25/2018 reviewed and showed an EF of 50-55%. Echo report from 06/09/17 reviewed and shows an EF of 20-25% along with mild/mod TR and mildly elevated PA pressure of 40 mm Hg.   Has not been in the ED or admitted in the last 6 months.    He presents today for a follow-up visit although hasn't been seen since December 2020. He currently has no complaints and specifically denies any difficulty sleeping, dizziness, abdominal distention, palpitations, pedal edema, chest pain, shortness of breath, cough or fatigue.   Hasn't been weighing himself because he says the scales don't work anymore.   Past Medical History:  Diagnosis Date  . Chronic systolic CHF (congestive heart failure) ([facility]) 06/2017   a. TTE 11/18: EF of 20-25% , difuse hypokinesis, mild to moderate tricuspid regurgitation, and mild pulmonary hypertension  . Coronary artery disease 06/2017   Non-ST elevation myocardial infarction.  Treated medically with no cardiac cath done due to active infection and renal failure.  . Dermatitis   . HIV (human immunodeficiency virus infection) ([facility])   . Hypertension   . Pulmonary hypertension ([facility])    a. TTE 11/18: EF of 20-25% , difuse hypokinesis, mild to moderate tricuspid regurgitation, and mild pulmonary hypertension w/ PASP 40 mmHg   Past Surgical History:  Procedure Laterality Date  . APPENDECTOMY    . IR RADIOLOGIST EVAL & MGMT  06/19/2017   Family History  Problem Relation Age of Onset  . Cerebral aneurysm Brother        died  . Hypertension Mother   . Diabetes Mother   . Heart attack Father    Social History   Tobacco Use  . Smoking status: Current Some Day Smoker    Packs/day: 0.50    Types: Cigarettes    Last attempt to quit: 06/11/2017    Years  since quitting: 3.2  . Smokeless tobacco: Never Used  . Tobacco comment: 02/17/2018- Smokes 2 cigarettes a day  Substance Use Topics  . Alcohol use: Yes    Alcohol/week: 2.0 standard drinks    Types: 2 Cans of beer per week   No Known Allergies  Past Medical History:  Diagnosis Date  . Chronic systolic CHF (congestive heart failure) ([facility]) 06/2017   a. TTE 11/18: EF of 20-25% , difuse hypokinesis, mild to moderate tricuspid regurgitation, and mild pulmonary hypertension  . Coronary artery disease 06/2017   Non-ST elevation myocardial infarction.  Treated medically with no cardiac cath done due to active infection and renal failure.  . Dermatitis   . HIV (human immunodeficiency virus infection) ([facility])   . Hypertension   . Pulmonary hypertension ([facility])    a. TTE 11/18: EF of 20-25% , difuse hypokinesis, mild to moderate tricuspid regurgitation, and mild pulmonary hypertension w/ PASP 40 mmHg   Past Surgical History:  Procedure Laterality Date  . APPENDECTOMY    . IR RADIOLOGIST EVAL & MGMT  06/19/2017   Family History  Problem Relation Age of Onset  . Cerebral aneurysm Brother        died  . Hypertension Mother   . Diabetes Mother   . Heart attack Father    Social History   Tobacco Use  . Smoking status: Current  Some Day Smoker    Packs/day: 0.50    Types: Cigarettes    Last attempt to quit: 06/11/2017    Years since quitting: 3.2  . Smokeless tobacco: Never Used  . Tobacco comment: 02/17/2018- Smokes 2 cigarettes a day  Substance Use Topics  . Alcohol use: Yes    Alcohol/week: 2.0 standard drinks    Types: 2 Cans of beer per week   No Known Allergies  Prior to Admission medications   Medication Sig Start Date End Date Taking? Authorizing Provider  aspirin 81 MG EC tablet Take 1 tablet (81 mg total) by mouth daily. 06/24/18  Yes Clarisa Kindred A, FNP  atorvastatin (LIPITOR) 40 MG tablet Take 1 tablet (40 mg total) by mouth daily at 6 PM. 09/06/19  Yes Delsy Etzkorn A, FNP   bictegravir-emtricitabine-tenofovir AF (BIKTARVY) 50-200-25 MG TABS tablet Take 1 tablet by mouth daily.   Yes [provider]  carvedilol (COREG) 6.25 MG tablet Take 1 tablet (6.25 mg total) by mouth 2 (two) times daily. 07/21/19  Yes Vandy Tsuchiya A, FNP  glipiZIDE (GLUCOTROL) 5 MG tablet Take 5 mg by mouth daily before breakfast.   Yes [provider]  metFORMIN (GLUCOPHAGE) 1000 MG tablet Take 1,000 mg by mouth 2 (two) times daily with a meal.   Yes [provider]  nitroGLYCERIN (NITROSTAT) 0.4 MG SL tablet Place 1 tablet (0.4 mg total) under the tongue every 5 (five) minutes as needed for chest pain. 06/24/18  Yes Sachiko Methot, Inetta Fermo A, FNP  torsemide (DEMADEX) 20 MG tablet Take 1 tablet (20 mg total) by mouth daily. May take 1 additional tablet (20 mg) as needed for shortness of breath/weight gain of 3 lbs. 07/21/19  Yes Artice Holohan A, FNP  sacubitril-valsartan (ENTRESTO) 24-26 MG Take 1 tablet by mouth 2 (two) times daily. 08/30/20   Delma Freeze, FNP    Review of Systems  Constitutional: Negative for appetite change and fatigue.  HENT: Negative for congestion, postnasal drip and sore throat.   Eyes: Negative.   Respiratory: Negative for cough, chest tightness and shortness of breath.   Cardiovascular: Negative for chest pain, palpitations and leg swelling.  Gastrointestinal: Negative for abdominal distention and abdominal pain.  Endocrine: Negative.   Genitourinary: Negative.   Musculoskeletal: Negative for back pain and neck pain.  Skin: Negative.   Allergic/Immunologic: Negative.   Neurological: Negative for dizziness and light-headedness.  Hematological: Negative for adenopathy. Does not bruise/bleed easily.  Psychiatric/Behavioral: Negative for dysphoric mood and sleep disturbance. The patient is not nervous/anxious.    Vitals:   08/30/20 1347  BP: (!) 141/89  Pulse: 73  Resp: 20  SpO2: 100%  Weight: 171 lb 8 oz (77.8 kg)  Height: 6\' 1"  (1.854  m)   Wt Readings from Last 3 Encounters:  08/30/20 171 lb 8 oz (77.8 kg)  07/21/19 183 lb 3.2 oz (83.1 kg)  10/12/18 177 lb 8 oz (80.5 kg)   Lab Results  Component Value Date   CREATININE 2.18 (H) 10/03/2017   CREATININE 2.34 (H) 09/03/2017   CREATININE 2.36 (H) 09/02/2017     Physical Exam Vitals and nursing note reviewed.  Constitutional:      Appearance: He is well-developed.  HENT:     Head: Normocephalic and atraumatic.  Neck:     Vascular: No JVD.  Cardiovascular:     Rate and Rhythm: Normal rate and regular rhythm.  Pulmonary:     Effort: Pulmonary effort is normal.     Breath  sounds: No wheezing or rales.  Abdominal:     General: There is no distension.     Tenderness: There is no abdominal tenderness.  Musculoskeletal:        General: No tenderness.     Cervical back: Normal range of motion and neck supple.  Skin:    General: Skin is warm and dry.  Neurological:     Mental Status: He is alert and oriented to person, place, and time.  Psychiatric:        Behavior: Behavior normal.        Thought Content: Thought content normal.    Assessment & Plan:  1: Chronic heart failure with preserved ejection fraction- - NYHA class I - euvolemic today - not weighing daily due to scales not working; instructed to change out the batteries and see if that works; if not we can provide scales at next visit. he was reminded to call for an overnight weight gain of >2 pounds or a weekly weight gain of >5 pounds - not adding salt and is using Mrs Sharilyn Sites for seasoning. Reminded to closely follow a 2000mg  sodium diet - saw cardiology ([facility]) 10/03/17 - will order echo to see if EF has changed before titrating up medications or adding anything; if it's low, could add SGLT-2 - drinking ~56 ounces of fluid daily  2: HTN- - BP mildly elevated today - BMP from 08/24/20 reviewed and showed sodium 140, potassium 5.1, creatinine 1.29 and GFR 71  - follows at [facility];  will request updated labs  3: Tobacco use- - smoking 3-4 cigarettes daily - complete cessation discussed for 3 minutes with patient    Patient did not bring his medications nor a list. Each medication was verbally reviewed with the patient and he was encouraged to bring the bottles to every visit to confirm accuracy of list.  Return in 3 weeks after echo done or sooner for any questions/problems before then.

## 2020-08-30 NOTE — Patient Instructions (Addendum)
Resume weighing daily and call for an overnight weight gain of > 2 pounds or a weekly weight gain of >5 pounds.   Will get updated echocardiogram (ultrasound of your heart) to decide about medication changes

## 2020-09-13 ENCOUNTER — Ambulatory Visit: Admission: RE | Admit: 2020-09-13 | Payer: Medicaid Other | Source: Ambulatory Visit

## 2020-09-20 ENCOUNTER — Ambulatory Visit: Payer: Medicaid Other | Admitting: Family

## 2020-09-28 ENCOUNTER — Ambulatory Visit: Payer: Medicaid Other | Admitting: Family

## 2020-10-04 ENCOUNTER — Telehealth: Payer: Self-pay | Admitting: Family

## 2020-10-04 ENCOUNTER — Ambulatory Visit: Payer: Medicaid Other | Admitting: Family

## 2020-10-04 ENCOUNTER — Ambulatory Visit: Payer: Medicaid Other

## 2020-10-04 NOTE — Telephone Encounter (Signed)
Patient did not show for his Heart Failure Clinic appointment on 10/04/20. Will attempt to reschedule.

## 2020-10-04 NOTE — Telephone Encounter (Signed)
Unable to reach patient in attempt to reschedule his no show appointment for echo and CHF CLinic. WIll call [facility] to see about getting an appointment and echo done same day.    Kohei Antonellis, NT

## 2020-10-05 ENCOUNTER — Telehealth: Payer: Self-pay | Admitting: Family

## 2020-10-05 ENCOUNTER — Other Ambulatory Visit: Payer: Self-pay | Admitting: Family

## 2020-10-05 NOTE — Telephone Encounter (Signed)
Spoke to patient regarding his no show Echo and CHF CLinic appointment and notified him of the changes. Since he has missed several echos with the hospital and its hard to get him to multiple appointments we scheduled an echo and appointment the same day at [facility] and hopefully gave him enough time to make his appointment.   Amber, NT

## 2020-10-12 ENCOUNTER — Other Ambulatory Visit: Payer: Self-pay | Admitting: Registered Nurse

## 2020-10-21 NOTE — Progress Notes (Deleted)
Cardiology Office Note    Date:  10/21/2020   ID:  Manuel Rosales, DOB 11/25/1965, MRN [id number]  PCP:  [facility]  Cardiologist:  Lorine Bears, MD  Electrophysiologist:  None   Chief Complaint: Follow up  History of Present Illness:   Manuel Rosales is a 55 y.o. male with history of CAD with NSTEMI in 06/2017 in the setting of an abdominal abscess, HFrEF with subsequent improvement in LV systolic function to low normal by echo in 08/2018, pulmonary hypertension, HIV, AKI, HTN, and tobacco use who presents for follow-up of his CAD and cardiomyopathy.  He was admitted in 06/2017 with shortness of breath, orthopnea, and abdominal swelling with poorly controlled hypertension.  CT of the abdomen and pelvis showed a fluid collection involving the right psoas/LES muscles.  He was noted to have an elevated troponin at 1.87.  Echo demonstrated an EF of 20 to 25%, diffuse hypokinesis, mild to moderate tricuspid regurgitation, and mild pulmonary hypertension.  Diagnostic LHC was not performed due to active infection and ARF.  He underwent CT-guided drainage of the abscess and was treated with antibiotics.  Recommendation was to pursue outpatient ischemic evaluation once his infection and renal function improved.  In follow-up, this continue to be delayed in the setting of AKI and it was ultimately recommended he undergo Lexiscan MPI to evaluate for high risk ischemia when he was last seen in 10/2017, though did not follow through with this.  Since we last saw him in 10/2017 he has been followed by the [facility].  Repeat echo through the CHF clinic showed an improved LV systolic function with an EF of 50 to 55%, normal diastolic function parameters, normal RV systolic function and ventricular cavity size, no significant valvular abnormalities, and a trivial pericardial effusion identified posterior to the heart.  He was last evaluated by the [facility] in 08/2020  with BP 141/89 and a documented weight of 171 pounds which was down from 183 pounds at his visit in 07/2019.  No changes were made.  Since that visit he has missed several CHF clinic appointments as well as previously scheduled echo.  In this setting he was scheduled for echo and follow-up appointment with me today on the same day in an effort to minimize his travel.    ***   Labs independently reviewed: 08/2020 - Hgb 13.4, PLT 353, BUN 15, serum creatinine 1.29, potassium 5.1, albumin 4.3, AST/ALT normal, TC 133, TG 155, HDL 64, LDL 43, A1c 5.1  Past Medical History:  Diagnosis Date  . Chronic systolic CHF (congestive heart failure) ([facility]) 06/2017   a. TTE 11/18: EF of 20-25% , difuse hypokinesis, mild to moderate tricuspid regurgitation, and mild pulmonary hypertension  . Coronary artery disease 06/2017   Non-ST elevation myocardial infarction.  Treated medically with no cardiac cath done due to active infection and renal failure.  . Dermatitis   . HIV (human immunodeficiency virus infection) ([facility])   . Hypertension   . Pulmonary hypertension ([facility])    a. TTE 11/18: EF of 20-25% , difuse hypokinesis, mild to moderate tricuspid regurgitation, and mild pulmonary hypertension w/ PASP 40 mmHg    Past Surgical History:  Procedure Laterality Date  . APPENDECTOMY    . IR RADIOLOGIST EVAL & MGMT  06/19/2017    Current Medications: No outpatient medications have been marked as taking for the 10/26/20 encounter (Appointment) with Sondra Barges, PA-C.    Allergies:   Patient has  no known allergies.   Social History   Socioeconomic History  . Marital status: Single    Spouse name: Not on file  . Number of children: 2  . Years of education: 50  . Highest education level: 12th grade  Occupational History  . Not on file  Tobacco Use  . Smoking status: Current Some Day Smoker    Packs/day: 0.50    Types: Cigarettes    Last attempt to quit: 06/11/2017    Years since quitting: 3.3  .  Smokeless tobacco: Never Used  . Tobacco comment: 02/17/2018- Smokes 2 cigarettes a day  Vaping Use  . Vaping Use: Never used  Substance and Sexual Activity  . Alcohol use: Yes    Alcohol/week: 2.0 standard drinks    Types: 2 Cans of beer per week  . Drug use: Not Currently    Types: Marijuana    Comment: Quit a while ago  . Sexual activity: Not Currently    Birth control/protection: None  Other Topics Concern  . Not on file  Social History Narrative  . Not on file   Social Determinants of Health   Financial Resource Strain: Not on file  Food Insecurity: Not on file  Transportation Needs: Not on file  Physical Activity: Not on file  Stress: Not on file  Social Connections: Not on file     Family History:  The patient's family history includes Cerebral aneurysm in his brother; Diabetes in his mother; Heart attack in his father; Hypertension in his mother.  ROS:   ROS   EKGs/Labs/Other Studies Reviewed:    Studies reviewed were summarized above. The additional studies were reviewed today:  2D echo 08/2018: - Left ventricle: The cavity size was normal. Wall thickness was  normal. Systolic function was normal. The estimated ejection  fraction was in the range of 50% to 55%. Left ventricular  diastolic function parameters were normal.  - Right ventricle: The cavity size was normal. Wall thickness was  normal. Systolic function was normal.  - Pulmonary arteries: Systolic pressure could not be accurately  estimated.  - Pericardium, extracardiac: A trivial pericardial effusion was  identified posterior to the heart. __________  2D echo 06/2017: - Left ventricle: The cavity size was normal. There was mild  concentric hypertrophy. Systolic function was severely reduced.  The estimated ejection fraction was in the range of 20% to 25%.  Diffuse hypokinesis. Doppler parameters are consistent with  abnormal left ventricular relaxation (grade 1 diastolic   dysfunction).  - Right ventricle: The cavity size was mildly dilated. Wall  thickness was normal. Systolic function was normal.  - Right atrium: The atrium was mildly dilated.  - Tricuspid valve: There was mild-moderate regurgitation.  - Pulmonary arteries: Systolic pressure was mildly elevated. PA  peak pressure: 40 mm Hg (S).  - Pericardium, extracardiac: A trivial pericardial effusion was  identified.    EKG:  EKG is ordered today.  The EKG ordered today demonstrates ***  Recent Labs: No results found for requested labs within last 8760 hours.  Recent Lipid Panel    Component Value Date/Time   CHOL 123 06/09/2017 1222   TRIG 94 06/09/2017 1222   HDL 21 (L) 06/09/2017 1222   CHOLHDL 5.9 06/09/2017 1222   VLDL 19 06/09/2017 1222   LDLCALC 83 06/09/2017 1222    PHYSICAL EXAM:    VS:  There were no vitals taken for this visit.  BMI: There is no height or weight on file to calculate BMI.  Physical Exam  Wt Readings from Last 3 Encounters:  08/30/20 171 lb 8 oz (77.8 kg)  07/21/19 183 lb 3.2 oz (83.1 kg)  10/12/18 177 lb 8 oz (80.5 kg)     ASSESSMENT & PLAN:   1. HFrEF:  2. CAD:  3. CKD stage III:  4. HTN: Blood pressure ***  5. HLD: LDL of 43 from 08/2020 with normal LFT at that time.  Disposition: F/u with Dr. Kirke Corin or an APP in ***.   Medication Adjustments/Labs and Tests Ordered: Current medicines are reviewed at length with the patient today.  Concerns regarding medicines are outlined above. Medication changes, Labs and Tests ordered today are summarized above and listed in the Patient Instructions accessible in Encounters.   Signed, Eula Listen, PA-C 10/21/2020 11:34 AM     [facility] [place] [address] [place], [place] [address] [phone]

## 2020-10-26 ENCOUNTER — Other Ambulatory Visit: Payer: Medicaid Other

## 2020-10-26 ENCOUNTER — Ambulatory Visit: Payer: Medicaid Other | Admitting: Physician Assistant

## 2020-10-27 ENCOUNTER — Encounter: Payer: Self-pay | Admitting: Physician Assistant

## 2020-11-23 ENCOUNTER — Other Ambulatory Visit: Payer: Medicaid Other

## 2020-12-04 ENCOUNTER — Ambulatory Visit: Payer: Medicaid Other | Admitting: Physician Assistant

## 2020-12-26 ENCOUNTER — Other Ambulatory Visit: Payer: Medicaid Other

## 2020-12-31 NOTE — Progress Notes (Deleted)
Cardiology Office Note    Date:  12/31/2020   ID:  Manuel Rosales, DOB [birth year], MRN [id number]  PCP:  [facility]  Cardiologist:  Lorine Bears, MD  Electrophysiologist:  None   Chief Complaint: Follow up  History of Present Illness:   Manuel Rosales is a 55 y.o. male with history of CAD with NSTEMI in 06/2017 in the setting of an abdominal abscess, chronic combined systolic and diastolic CHF with subsequent improvement in LVSF by echo in 08/2018 as outlined below, pulmonary hypertension, HIV, CKD stage III, HTN, and tobacco use who presents for follow up of his CAD and cardiomyopathy.   He was admitted in 06/2017 with SOB, orthopnea, and abdominal swelling with poorly controlled hypertension. CT abdomen and pelvis showed fluid collection involving the right psoas/ileus muscles. His troponin peaked at 1.87. Echo 06/09/17, showed an EF of 20-25%, diffuse hypokinesis, mild to moderate tricuspid regurgitation, and mild pulmonary hypertension. Cardiac cath was not performed due to active infection and acute renal failure. He underwent CT-guided drainage of the abscess and was treated with antibiotics. With regards to his cardiomyopathy, he was medically managed. In the outpatient setting, cardiac cath could not safely be performed secondary to worsening renal function in the context of outpatient diuresis. In this setting, Lexiscan MPI was advised, though not completed.   He was last seen in our office in 10/2017, and has been lost to follow up since. He has been followed by the [facility], with repeat echo in 08/2018 demonstrating an improved LVSF with an EF of 50-55%, normal LV diastolic function parameters, normal RV cavity size and systolic function, and a trivial pericardial effusion noted posterior to the heart. He last saw the [facility] in 08/2020. Follow up echo was advised at that time, though has not been completed.   ***   Labs independently  reviewed: 08/2020 - HGB 13.4, PLT 353, BUN 15, SCr 1.29, potassium 5.1, albumin 4.3, AST/ALT normal, TC 133, TG 155, HDL 64, LDL 43, A1c 5.1%  Past Medical History:  Diagnosis Date  . Chronic systolic CHF (congestive heart failure) (HCC) 06/2017   a. TTE 11/18: EF of 20-25% , difuse hypokinesis, mild to moderate tricuspid regurgitation, and mild pulmonary hypertension  . Coronary artery disease 06/2017   Non-ST elevation myocardial infarction.  Treated medically with no cardiac cath done due to active infection and renal failure.  . Dermatitis   . HIV (human immunodeficiency virus infection) (HCC)   . Hypertension   . Pulmonary hypertension (HCC)    a. TTE 11/18: EF of 20-25% , difuse hypokinesis, mild to moderate tricuspid regurgitation, and mild pulmonary hypertension w/ PASP 40 mmHg    Past Surgical History:  Procedure Laterality Date  . APPENDECTOMY    . IR RADIOLOGIST EVAL & MGMT  06/19/2017    Current Medications: No outpatient medications have been marked as taking for the 01/02/21 encounter (Appointment) with Sondra Barges, PA-C.    Allergies:   Patient has no known allergies.   Social History   Socioeconomic History  . Marital status: Single    Spouse name: Not on file  . Number of children: 2  . Years of education: 15  . Highest education level: 12th grade  Occupational History  . Not on file  Tobacco Use  . Smoking status: Current Some Day Smoker    Packs/day: 0.50    Types: Cigarettes    Last attempt to quit: 06/11/2017  Years since quitting: 3.5  . Smokeless tobacco: Never Used  . Tobacco comment: 02/17/2018- Smokes 2 cigarettes a day  Vaping Use  . Vaping Use: Never used  Substance and Sexual Activity  . Alcohol use: Yes    Alcohol/week: 2.0 standard drinks    Types: 2 Cans of beer per week  . Drug use: Not Currently    Types: Marijuana    Comment: Quit a while ago  . Sexual activity: Not Currently    Birth control/protection: None  Other Topics  Concern  . Not on file  Social History Narrative  . Not on file   Social Determinants of Health   Financial Resource Strain: Not on file  Food Insecurity: Not on file  Transportation Needs: Not on file  Physical Activity: Not on file  Stress: Not on file  Social Connections: Not on file     Family History:  The patient's family history includes Cerebral aneurysm in his brother; Diabetes in his mother; Heart attack in his father; Hypertension in his mother.  ROS:   ROS   EKGs/Labs/Other Studies Reviewed:    Studies reviewed were summarized above. The additional studies were reviewed today:  2D echo 08/2018: - Left ventricle: The cavity size was normal. Wall thickness was  normal. Systolic function was normal. The estimated ejection  fraction was in the range of 50% to 55%. Left ventricular  diastolic function parameters were normal.  - Right ventricle: The cavity size was normal. Wall thickness was  normal. Systolic function was normal.  - Pulmonary arteries: Systolic pressure could not be accurately  estimated.  - Pericardium, extracardiac: A trivial pericardial effusion was  identified posterior to the heart. __________  2D echo 06/2017: - Left ventricle: The cavity size was normal. There was mild  concentric hypertrophy. Systolic function was severely reduced.  The estimated ejection fraction was in the range of 20% to 25%.  Diffuse hypokinesis. Doppler parameters are consistent with  abnormal left ventricular relaxation (grade 1 diastolic  dysfunction).  - Right ventricle: The cavity size was mildly dilated. Wall  thickness was normal. Systolic function was normal.  - Right atrium: The atrium was mildly dilated.  - Tricuspid valve: There was mild-moderate regurgitation.  - Pulmonary arteries: Systolic pressure was mildly elevated. PA  peak pressure: 40 mm Hg (S).  - Pericardium, extracardiac: A trivial pericardial effusion was   identified.    EKG:  EKG is ordered today.  The EKG ordered today demonstrates ***  Recent Labs: No results found for requested labs within last 8760 hours.  Recent Lipid Panel    Component Value Date/Time   CHOL 123 06/09/2017 1222   TRIG 94 06/09/2017 1222   HDL 21 (L) 06/09/2017 1222   CHOLHDL 5.9 06/09/2017 1222   VLDL 19 06/09/2017 1222   LDLCALC 83 06/09/2017 1222    PHYSICAL EXAM:    VS:  There were no vitals taken for this visit.  BMI: There is no height or weight on file to calculate BMI.  Physical Exam  Wt Readings from Last 3 Encounters:  08/30/20 171 lb 8 oz (77.8 kg)  07/21/19 183 lb 3.2 oz (83.1 kg)  10/12/18 177 lb 8 oz (80.5 kg)     ASSESSMENT & PLAN:   1. Chronic combined systolic and diastolic CHF/pulmonary hypertension:  2. CAD involving the native coronary arteries without***angina:  3. CKD stage III:  4. HTN: Blood pressure ***  5. HLD: LDL 43 in 08/2020 with normal LFT at that  time.  ***  Disposition: F/u with Dr. Kirke Corin or an APP in ***.   Medication Adjustments/Labs and Tests Ordered: Current medicines are reviewed at length with the patient today.  Concerns regarding medicines are outlined above. Medication changes, Labs and Tests ordered today are summarized above and listed in the Patient Instructions accessible in Encounters.   Signed, Eula Listen, PA-C 12/31/2020 9:08 PM     [facility] [address] [place], [place] [address] [phone]

## 2021-01-02 ENCOUNTER — Ambulatory Visit: Payer: Medicaid Other | Admitting: Physician Assistant

## 2021-01-03 ENCOUNTER — Encounter: Payer: Self-pay | Admitting: Physician Assistant

## 2021-01-06 ENCOUNTER — Other Ambulatory Visit: Payer: Self-pay | Admitting: Family

## 2021-01-15 ENCOUNTER — Other Ambulatory Visit: Payer: Self-pay | Admitting: Family

## 2021-01-16 ENCOUNTER — Other Ambulatory Visit: Payer: Self-pay | Admitting: Family

## 2021-01-16 MED ORDER — ATORVASTATIN CALCIUM 40 MG PO TABS: ORAL_TABLET | ORAL | 3 refills | Status: DC

## 2021-02-25 NOTE — Progress Notes (Deleted)
Patient ID: Manuel Rosales, male    DOB: [birth year], 55 y.o.   MRN: [id number]  HPI  Manuel Rosales is a 55 y/o male with a history of HTN, HIV, psoas abscess, current tobacco use and chronic heart failure.   Echo report from 08/25/2018 reviewed and showed an EF of 50-55%. Echo report from 06/09/17 reviewed and shows an EF of 20-25% along with mild/mod TR and mildly elevated PA pressure of 40 mm Hg.   Has not been in the ED or admitted in the last 6 months.    He presents today for a follow-up visit with a chief complaint of  Past Medical History:  Diagnosis Date   Chronic systolic CHF (congestive heart failure) (HCC) 06/2017   a. TTE 11/18: EF of 20-25% , difuse hypokinesis, mild to moderate tricuspid regurgitation, and mild pulmonary hypertension   Coronary artery disease 06/2017   Non-ST elevation myocardial infarction.  Treated medically with no cardiac cath done due to active infection and renal failure.   Dermatitis    HIV (human immunodeficiency virus infection) (HCC)    Hypertension    Pulmonary hypertension (HCC)    a. TTE 11/18: EF of 20-25% , difuse hypokinesis, mild to moderate tricuspid regurgitation, and mild pulmonary hypertension w/ PASP 40 mmHg   Past Surgical History:  Procedure Laterality Date   APPENDECTOMY     IR RADIOLOGIST EVAL & MGMT  06/19/2017   Family History  Problem Relation Age of Onset   Cerebral aneurysm Brother        died   Hypertension Mother    Diabetes Mother    Heart attack Father    Social History   Tobacco Use   Smoking status: Some Days    Packs/day: 0.50    Types: Cigarettes    Last attempt to quit: 06/11/2017    Years since quitting: 3.7   Smokeless tobacco: Never   Tobacco comments:    02/17/2018- Smokes 2 cigarettes a day  Substance Use Topics   Alcohol use: Yes    Alcohol/week: 2.0 standard drinks    Types: 2 Cans of beer per week   No Known Allergies  Past Medical History:  Diagnosis Date   Chronic systolic CHF  (congestive heart failure) (HCC) 06/2017   a. TTE 11/18: EF of 20-25% , difuse hypokinesis, mild to moderate tricuspid regurgitation, and mild pulmonary hypertension   Coronary artery disease 06/2017   Non-ST elevation myocardial infarction.  Treated medically with no cardiac cath done due to active infection and renal failure.   Dermatitis    HIV (human immunodeficiency virus infection) (HCC)    Hypertension    Pulmonary hypertension (HCC)    a. TTE 11/18: EF of 20-25% , difuse hypokinesis, mild to moderate tricuspid regurgitation, and mild pulmonary hypertension w/ PASP 40 mmHg   Past Surgical History:  Procedure Laterality Date   APPENDECTOMY     IR RADIOLOGIST EVAL & MGMT  06/19/2017   Family History  Problem Relation Age of Onset   Cerebral aneurysm Brother        died   Hypertension Mother    Diabetes Mother    Heart attack Father    Social History   Tobacco Use   Smoking status: Some Days    Packs/day: 0.50    Types: Cigarettes    Last attempt to quit: 06/11/2017    Years since quitting: 3.7   Smokeless tobacco: Never   Tobacco comments:    02/17/2018- Smokes 2 cigarettes  a day  Substance Use Topics   Alcohol use: Yes    Alcohol/week: 2.0 standard drinks    Types: 2 Cans of beer per week   No Known Allergies    Review of Systems  Constitutional:  Negative for fatigue.  Respiratory:  Negative for cough.       Physical Exam  Assessment & Plan:  1: Chronic heart failure with preserved ejection fraction- - NYHA class I - euvolemic today - not weighing daily due to scales not working; instructed to change out the batteries and see if that works; if not we can provide scales at next visit. he was reminded to call for an overnight weight gain of >2 pounds or a weekly weight gain of >5 pounds - weight 171.8 from last visit here 6 months ago - not adding salt and is using Mrs Sharilyn Sites for seasoning. Reminded to closely follow a 2000mg  sodium diet - saw cardiology  ([facility]) 10/03/17  - drinking ~56 ounces of fluid daily  2: HTN- - BP  - BMP from 08/24/20 reviewed and showed sodium 140, potassium 5.1, creatinine 1.29 and GFR 71  - follows at [facility]  3: Tobacco use- - smoking 3-4 cigarettes daily - complete cessation discussed for 3 minutes with patient    Patient did not bring his medications nor a list. Each medication was verbally reviewed with the patient and he was encouraged to bring the bottles to every visit to confirm accuracy of list.

## 2021-02-26 ENCOUNTER — Ambulatory Visit: Payer: Medicaid Other | Admitting: Family

## 2021-02-26 ENCOUNTER — Telehealth: Payer: Self-pay | Admitting: Family

## 2021-02-26 NOTE — Telephone Encounter (Signed)
Patient did not show for his Heart Failure Clinic appointment on 02/26/21. Will attempt to reschedule.

## 2021-03-07 ENCOUNTER — Other Ambulatory Visit: Payer: Self-pay | Admitting: Family

## 2021-03-12 ENCOUNTER — Other Ambulatory Visit: Payer: Self-pay | Admitting: Family

## 2021-03-12 MED ORDER — ENTRESTO 24-26 MG PO TABS: 1.0000 | ORAL_TABLET | Freq: Two times a day (BID) | ORAL | 5 refills | Status: DC

## 2021-03-14 ENCOUNTER — Other Ambulatory Visit: Payer: Self-pay

## 2021-03-14 ENCOUNTER — Ambulatory Visit: Payer: Medicaid Other | Attending: Family | Admitting: Family

## 2021-03-14 ENCOUNTER — Encounter: Payer: Self-pay | Admitting: Family

## 2021-03-14 VITALS — BP 105/62 | HR 72 | Ht 73.0 in | Wt 158.5 lb

## 2021-03-14 DIAGNOSIS — I110 Hypertensive heart disease with heart failure: Secondary | ICD-10-CM | POA: Diagnosis not present

## 2021-03-14 DIAGNOSIS — Z21 Asymptomatic human immunodeficiency virus [HIV] infection status: Secondary | ICD-10-CM | POA: Insufficient documentation

## 2021-03-14 DIAGNOSIS — B351 Tinea unguium: Secondary | ICD-10-CM | POA: Insufficient documentation

## 2021-03-14 DIAGNOSIS — I5032 Chronic diastolic (congestive) heart failure: Secondary | ICD-10-CM | POA: Diagnosis not present

## 2021-03-14 DIAGNOSIS — Z79899 Other long term (current) drug therapy: Secondary | ICD-10-CM | POA: Insufficient documentation

## 2021-03-14 DIAGNOSIS — Z8249 Family history of ischemic heart disease and other diseases of the circulatory system: Secondary | ICD-10-CM | POA: Insufficient documentation

## 2021-03-14 DIAGNOSIS — K6812 Psoas muscle abscess: Secondary | ICD-10-CM | POA: Diagnosis not present

## 2021-03-14 DIAGNOSIS — F17210 Nicotine dependence, cigarettes, uncomplicated: Secondary | ICD-10-CM | POA: Diagnosis not present

## 2021-03-14 DIAGNOSIS — Z7984 Long term (current) use of oral hypoglycemic drugs: Secondary | ICD-10-CM | POA: Diagnosis not present

## 2021-03-14 DIAGNOSIS — Z7982 Long term (current) use of aspirin: Secondary | ICD-10-CM | POA: Diagnosis not present

## 2021-03-14 DIAGNOSIS — I10 Essential (primary) hypertension: Secondary | ICD-10-CM

## 2021-03-14 DIAGNOSIS — Z720 Tobacco use: Secondary | ICD-10-CM

## 2021-03-14 DIAGNOSIS — R21 Rash and other nonspecific skin eruption: Secondary | ICD-10-CM

## 2021-03-14 NOTE — Progress Notes (Signed)
Patient ID: Manuel Rosales, male    DOB: March 18, 1966, 55 y.o.   MRN: [id number]  HPI  Manuel Rosales is a 55 y/o male with a history of HTN, HIV, psoas abscess, current tobacco use and chronic heart failure.   Echo report from 08/25/2018 reviewed and showed an EF of 50-55%. Echo report from 06/09/17 reviewed and shows an EF of 20-25% along with mild/mod TR and mildly elevated PA pressure of 40 mm Hg.   Has not been in the ED or admitted in the last 6 months.    He presents today for a follow-up visit with a chief complaint of some sort of "rash" on the knuckles of both hands that itches. He says that this has been present for about a week and that he hasn't tried anything for it. Also has a thickened left great toenail that is somewhat painful to touch. Has no other symptoms and specifically denies any difficulty sleeping, abdominal distention, palpitations, pedal edema, chest pain, shortness of breath, cough, light-headedness, fatigue or weight gain.   Says that he hasn't been eating as much during this hot weather as well as dealing with the death of his mom.   Past Medical History:  Diagnosis Date   Chronic systolic CHF (congestive heart failure) (HCC) 06/2017   a. TTE 11/18: EF of 20-25% , difuse hypokinesis, mild to moderate tricuspid regurgitation, and mild pulmonary hypertension   Coronary artery disease 06/2017   Non-ST elevation myocardial infarction.  Treated medically with no cardiac cath done due to active infection and renal failure.   Dermatitis    HIV (human immunodeficiency virus infection) (HCC)    Hypertension    Pulmonary hypertension (HCC)    a. TTE 11/18: EF of 20-25% , difuse hypokinesis, mild to moderate tricuspid regurgitation, and mild pulmonary hypertension w/ PASP 40 mmHg   Past Surgical History:  Procedure Laterality Date   APPENDECTOMY     IR RADIOLOGIST EVAL & MGMT  06/19/2017   Family History  Problem Relation Age of Onset   Cerebral aneurysm Brother         died   Hypertension Mother    Diabetes Mother    Heart attack Father    Social History   Tobacco Use   Smoking status: Some Days    Packs/day: 0.50    Types: Cigarettes    Last attempt to quit: 06/11/2017    Years since quitting: 3.7   Smokeless tobacco: Never   Tobacco comments:    02/17/2018- Smokes 2 cigarettes a day  Substance Use Topics   Alcohol use: Yes    Alcohol/week: 2.0 standard drinks    Types: 2 Cans of beer per week   No Known Allergies  Past Medical History:  Diagnosis Date   Chronic systolic CHF (congestive heart failure) (HCC) 06/2017   a. TTE 11/18: EF of 20-25% , difuse hypokinesis, mild to moderate tricuspid regurgitation, and mild pulmonary hypertension   Coronary artery disease 06/2017   Non-ST elevation myocardial infarction.  Treated medically with no cardiac cath done due to active infection and renal failure.   Dermatitis    HIV (human immunodeficiency virus infection) (HCC)    Hypertension    Pulmonary hypertension (HCC)    a. TTE 11/18: EF of 20-25% , difuse hypokinesis, mild to moderate tricuspid regurgitation, and mild pulmonary hypertension w/ PASP 40 mmHg   Past Surgical History:  Procedure Laterality Date   APPENDECTOMY     IR RADIOLOGIST EVAL & MGMT  06/19/2017   Family History  Problem Relation Age of Onset   Cerebral aneurysm Brother        died   Hypertension Mother    Diabetes Mother    Heart attack Father    Social History   Tobacco Use   Smoking status: Some Days    Packs/day: 0.50    Types: Cigarettes    Last attempt to quit: 06/11/2017    Years since quitting: 3.7   Smokeless tobacco: Never   Tobacco comments:    02/17/2018- Smokes 2 cigarettes a day  Substance Use Topics   Alcohol use: Yes    Alcohol/week: 2.0 standard drinks    Types: 2 Cans of beer per week   No Known Allergies  Prior to Admission medications   Medication Sig Start Date End Date Taking? Authorizing Provider  aspirin 81 MG EC tablet Take 1  tablet (81 mg total) by mouth daily. 06/24/18  Yes Clarisa Kindred A, FNP  atorvastatin (LIPITOR) 40 MG tablet TAKE 1 TABLET BY MOUTH ONCE DAILY AT  6  PM 01/16/21  Yes Kethan Papadopoulos A, FNP  bictegravir-emtricitabine-tenofovir AF (BIKTARVY) 50-200-25 MG TABS tablet Take 1 tablet by mouth daily.   Yes [provider]  carvedilol (COREG) 6.25 MG tablet Take 1 tablet (6.25 mg total) by mouth 2 (two) times daily. 07/21/19  Yes Marna Weniger A, FNP  glipiZIDE (GLUCOTROL) 5 MG tablet Take 5 mg by mouth daily before breakfast.   Yes [provider]  metFORMIN (GLUCOPHAGE) 1000 MG tablet Take 1,000 mg by mouth 2 (two) times daily with a meal.   Yes [provider]  nitroGLYCERIN (NITROSTAT) 0.4 MG SL tablet Place 1 tablet (0.4 mg total) under the tongue every 5 (five) minutes as needed for chest pain. 06/24/18  Yes Joylynn Defrancesco A, FNP  sacubitril-valsartan (ENTRESTO) 24-26 MG Take 1 tablet by mouth 2 (two) times daily. 03/12/21  Yes Clarisa Kindred A, FNP  torsemide (DEMADEX) 20 MG tablet Take 1 tablet (20 mg total) by mouth daily. May take 1 additional tablet (20 mg) as needed for shortness of breath/weight gain of 3 lbs. 07/21/19  Yes Delma Freeze, FNP    Review of Systems  Constitutional:  Negative for appetite change and fatigue.  HENT:  Negative for postnasal drip and sore throat.   Eyes: Negative.   Respiratory:  Negative for cough, chest tightness and shortness of breath.   Cardiovascular:  Negative for chest pain, palpitations and leg swelling.  Gastrointestinal:  Negative for abdominal distention and abdominal pain.  Endocrine: Negative.   Genitourinary: Negative.   Musculoskeletal:  Negative for back pain and neck pain.  Skin:  Positive for rash (top of hands ~ 1 week).  Allergic/Immunologic: Negative.   Neurological:  Negative for dizziness and light-headedness.  Hematological:  Negative for adenopathy. Does not bruise/bleed easily.  Psychiatric/Behavioral:   Negative for dysphoric mood and sleep disturbance. The patient is not nervous/anxious.    Vitals:   03/14/21 1210  BP: 105/62  Pulse: 72  SpO2: 99%  Weight: 158 lb 8 oz (71.9 kg)  Height: 6\' 1"  (1.854 m)   Wt Readings from Last 3 Encounters:  03/14/21 158 lb 8 oz (71.9 kg)  08/30/20 171 lb 8 oz (77.8 kg)  07/21/19 183 lb 3.2 oz (83.1 kg)   Lab Results  Component Value Date   CREATININE 2.18 (H) 10/03/2017   CREATININE 2.34 (H) 09/03/2017   CREATININE 2.36 (H) 09/02/2017    Physical Exam Vitals  and nursing note reviewed.  Constitutional:      Appearance: Normal appearance.  HENT:     Head: Normocephalic and atraumatic.  Cardiovascular:     Rate and Rhythm: Normal rate.  Pulmonary:     Effort: Pulmonary effort is normal. No respiratory distress.     Breath sounds: No wheezing or rales.  Abdominal:     General: There is no distension.     Palpations: Abdomen is soft.     Tenderness: There is no abdominal tenderness.  Musculoskeletal:        General: No tenderness.     Cervical back: Normal range of motion and neck supple.     Right lower leg: No edema.     Left lower leg: No edema.  Skin:    General: Skin is warm and dry.     Findings: Rash (over knuckles of both hands) present.     Comments: Left great toenail thickened and curved  Neurological:     General: No focal deficit present.     Mental Status: He is alert and oriented to person, place, and time.  Psychiatric:        Mood and Affect: Mood normal.        Behavior: Behavior normal.        Thought Content: Thought content normal.   Assessment & Plan:  1: Chronic heart failure with preserved ejection fraction- - NYHA class I - euvolemic today - weighing daily; reminded to call for an overnight weight gain of >2 pounds or a weekly weight gain of >5 pounds - weight down 13 pounds from last visit here 6 months ago - not eating as much due to the hot weather; says that he has plenty to eat and denies any  food insecurities - not adding salt and is using Mrs Sharilyn Sites for seasoning. Reminded to closely follow a 2000mg  sodium diet - saw cardiology ([facility]) 10/03/17 - drinking ~56 ounces of fluid daily  2: HTN- - BP looks good today - BMP from 08/24/20 reviewed and showed sodium 140, potassium 5.1, creatinine 1.29 and GFR 71  - [facility]  3: Tobacco use- - smoking 3-4 cigarettes daily - complete cessation discussed for 3 minutes with patient  4: Hand rash/ toenail fungus- - can try OTC hydrocortisone on his hands to see if that helps with the itching - instructed to call PCP to get an appointment scheduled regarding these issues   Patient did not bring his medications nor a list. Each medication was verbally reviewed with the patient and he was encouraged to bring the bottles to every visit to confirm accuracy of list.  Return in 6 months or sooner for any questions/problems before then.

## 2021-03-14 NOTE — Patient Instructions (Signed)
Continue weighing daily and call for an overnight weight gain of > 2 pounds or a weekly weight gain of >5 pounds. 

## 2021-08-09 ENCOUNTER — Telehealth: Payer: Self-pay | Admitting: Family

## 2021-08-09 NOTE — Telephone Encounter (Signed)
Insurance approved Entresto for patient with his prior authorization until 08/2022; Notified Patient   Hospital doctor, [place]

## 2021-09-14 ENCOUNTER — Other Ambulatory Visit: Payer: Self-pay | Admitting: Family

## 2021-09-22 NOTE — Progress Notes (Unsigned)
Patient ID: ROCK RIZK, male    DOB: Jun 21, 1966, 56 y.o.   MRN: [id number]  HPI  Manuel Rosales is a 56 y/o male with a history of HTN, HIV, psoas abscess, current tobacco use and chronic heart failure.   Echo report from 08/25/2018 reviewed and showed an EF of 50-55%. Echo report from 06/09/17 reviewed and shows an EF of 20-25% along with mild/mod TR and mildly elevated PA pressure of 40 mm Hg.   Has not been in the ED or admitted in the last 6 months.    He presents today for a follow-up visit with a chief complaint of   Past Medical History:  Diagnosis Date   Chronic systolic CHF (congestive heart failure) ([place]) 06/2017   a. TTE 11/18: EF of 20-25% , difuse hypokinesis, mild to moderate tricuspid regurgitation, and mild pulmonary hypertension   Coronary artery disease 06/2017   Non-ST elevation myocardial infarction.  Treated medically with no cardiac cath done due to active infection and renal failure.   Dermatitis    HIV (human immunodeficiency virus infection) ([place])    Hypertension    Pulmonary hypertension ([place])    a. TTE 11/18: EF of 20-25% , difuse hypokinesis, mild to moderate tricuspid regurgitation, and mild pulmonary hypertension w/ PASP 40 mmHg   Past Surgical History:  Procedure Laterality Date   APPENDECTOMY     IR RADIOLOGIST EVAL & MGMT  06/19/2017   Family History  Problem Relation Age of Onset   Cerebral aneurysm Brother        died   Hypertension Mother    Diabetes Mother    Heart attack Father    Social History   Tobacco Use   Smoking status: Some Days    Packs/day: 0.50    Types: Cigarettes    Last attempt to quit: 06/11/2017    Years since quitting: 4.2   Smokeless tobacco: Never   Tobacco comments:    02/17/2018- Smokes 2 cigarettes a day  Substance Use Topics   Alcohol use: Yes    Alcohol/week: 2.0 standard drinks    Types: 2 Cans of beer per week   No Known Allergies  Past Medical History:  Diagnosis Date   Chronic systolic CHF  (congestive heart failure) ([place]) 06/2017   a. TTE 11/18: EF of 20-25% , difuse hypokinesis, mild to moderate tricuspid regurgitation, and mild pulmonary hypertension   Coronary artery disease 06/2017   Non-ST elevation myocardial infarction.  Treated medically with no cardiac cath done due to active infection and renal failure.   Dermatitis    HIV (human immunodeficiency virus infection) ([place])    Hypertension    Pulmonary hypertension ([place])    a. TTE 11/18: EF of 20-25% , difuse hypokinesis, mild to moderate tricuspid regurgitation, and mild pulmonary hypertension w/ PASP 40 mmHg   Past Surgical History:  Procedure Laterality Date   APPENDECTOMY     IR RADIOLOGIST EVAL & MGMT  06/19/2017   Family History  Problem Relation Age of Onset   Cerebral aneurysm Brother        died   Hypertension Mother    Diabetes Mother    Heart attack Father    Social History   Tobacco Use   Smoking status: Some Days    Packs/day: 0.50    Types: Cigarettes    Last attempt to quit: 06/11/2017    Years since quitting: 4.2   Smokeless tobacco: Never   Tobacco comments:    02/17/2018- Smokes 2  cigarettes a day  Substance Use Topics   Alcohol use: Yes    Alcohol/week: 2.0 standard drinks    Types: 2 Cans of beer per week   No Known Allergies    Review of Systems  Constitutional:  Negative for appetite change and fatigue.  HENT:  Negative for postnasal drip and sore throat.   Eyes: Negative.   Respiratory:  Negative for cough, chest tightness and shortness of breath.   Cardiovascular:  Negative for chest pain, palpitations and leg swelling.  Gastrointestinal:  Negative for abdominal distention and abdominal pain.  Endocrine: Negative.   Genitourinary: Negative.   Musculoskeletal:  Negative for back pain and neck pain.  Skin:  Positive for rash (top of hands ~ 1 week).  Allergic/Immunologic: Negative.   Neurological:  Negative for dizziness and light-headedness.  Hematological:  Negative for  adenopathy. Does not bruise/bleed easily.  Psychiatric/Behavioral:  Negative for dysphoric mood and sleep disturbance. The patient is not nervous/anxious.     Physical Exam Vitals and nursing note reviewed.  Constitutional:      Appearance: Normal appearance.  HENT:     Head: Normocephalic and atraumatic.  Cardiovascular:     Rate and Rhythm: Normal rate.  Pulmonary:     Effort: Pulmonary effort is normal. No respiratory distress.     Breath sounds: No wheezing or rales.  Abdominal:     General: There is no distension.     Palpations: Abdomen is soft.     Tenderness: There is no abdominal tenderness.  Musculoskeletal:        General: No tenderness.     Cervical back: Normal range of motion and neck supple.     Right lower leg: No edema.     Left lower leg: No edema.  Skin:    General: Skin is warm and dry.     Findings: Rash (over knuckles of both hands) present.     Comments: Left great toenail thickened and curved  Neurological:     General: No focal deficit present.     Mental Status: He is alert and oriented to person, place, and time.  Psychiatric:        Mood and Affect: Mood normal.        Behavior: Behavior normal.        Thought Content: Thought content normal.   Assessment & Plan:  1: Chronic heart failure with preserved ejection fraction- - NYHA class I - euvolemic today - weighing daily; reminded to call for an overnight weight gain of >2 pounds or a weekly weight gain of >5 pounds - weight 158.8 pounds from last visit here 6 months ago - not adding salt and is using Mrs Deliah Boston for seasoning. Reminded to closely follow a 2000mg  sodium diet - on GDMT of  - saw cardiology ([facility]) 10/03/17 - drinking ~56 ounces of fluid daily  2: HTN- - BP  - BMP from 08/24/20 reviewed and showed sodium 140, potassium 5.1, creatinine 1.29 and GFR 71  - [facility] at [facility]  3: Tobacco use- - smoking 3-4 cigarettes daily - complete cessation discussed for 3  minutes with patient   Patient did not bring his medications nor a list. Each medication was verbally reviewed with the patient and he was encouraged to bring the bottles to every visit to confirm accuracy of list.

## 2021-09-24 ENCOUNTER — Telehealth: Payer: Self-pay | Admitting: Family

## 2021-09-24 ENCOUNTER — Ambulatory Visit: Payer: Medicaid Other | Admitting: Family

## 2021-09-24 NOTE — Telephone Encounter (Signed)
Patient did not show for his [facility] appointment on 09/24/21 even after confirming his appointment. Will attempt to reschedule.

## 2022-01-08 ENCOUNTER — Other Ambulatory Visit: Payer: Self-pay | Admitting: Family

## 2022-01-09 ENCOUNTER — Other Ambulatory Visit: Payer: Self-pay | Admitting: Family

## 2022-01-13 ENCOUNTER — Other Ambulatory Visit: Payer: Self-pay | Admitting: Family

## 2022-01-22 ENCOUNTER — Other Ambulatory Visit: Payer: Self-pay | Admitting: Family

## 2022-01-27 ENCOUNTER — Other Ambulatory Visit: Payer: Self-pay | Admitting: Family

## 2022-01-28 ENCOUNTER — Other Ambulatory Visit: Payer: Self-pay | Admitting: Family

## 2022-02-01 ENCOUNTER — Other Ambulatory Visit: Payer: Self-pay | Admitting: Family

## 2022-02-13 ENCOUNTER — Other Ambulatory Visit: Payer: Self-pay | Admitting: Family

## 2022-02-13 MED ORDER — ATORVASTATIN CALCIUM 40 MG PO TABS
40.0000 mg | ORAL_TABLET | Freq: Every day | ORAL | 0 refills | Status: DC
Start: 2022-02-13 — End: 2022-02-22

## 2022-02-13 MED ORDER — ATORVASTATIN CALCIUM 40 MG PO TABS: ORAL_TABLET | ORAL | 0 refills | Status: DC

## 2022-02-22 ENCOUNTER — Encounter: Payer: Self-pay | Admitting: Family

## 2022-02-22 ENCOUNTER — Ambulatory Visit: Payer: Medicaid Other | Attending: Family | Admitting: Family

## 2022-02-22 VITALS — BP 105/76 | HR 77 | Resp 16 | Ht 73.0 in | Wt 159.2 lb

## 2022-02-22 DIAGNOSIS — I110 Hypertensive heart disease with heart failure: Secondary | ICD-10-CM | POA: Insufficient documentation

## 2022-02-22 DIAGNOSIS — I5042 Chronic combined systolic (congestive) and diastolic (congestive) heart failure: Secondary | ICD-10-CM | POA: Insufficient documentation

## 2022-02-22 DIAGNOSIS — Z720 Tobacco use: Secondary | ICD-10-CM

## 2022-02-22 DIAGNOSIS — I10 Essential (primary) hypertension: Secondary | ICD-10-CM

## 2022-02-22 DIAGNOSIS — I5032 Chronic diastolic (congestive) heart failure: Secondary | ICD-10-CM | POA: Diagnosis not present

## 2022-02-22 DIAGNOSIS — F17210 Nicotine dependence, cigarettes, uncomplicated: Secondary | ICD-10-CM | POA: Insufficient documentation

## 2022-02-22 MED ORDER — ATORVASTATIN CALCIUM 40 MG PO TABS: 40.0000 mg | ORAL_TABLET | Freq: Every day | ORAL | 3 refills | Status: DC

## 2022-02-22 NOTE — Progress Notes (Signed)
Patient ID: Manuel Rosales, male    DOB: 1965-09-06, 56 y.o.   MRN: [id number]  HPI  Mr Cosgriff is a 56 y/o male with a history of HTN, HIV, psoas abscess, current tobacco use and chronic heart failure.   Echo report from 08/25/2018 reviewed and showed an EF of 50-55%. Echo report from 06/09/17 reviewed and shows an EF of 20-25% along with mild/mod TR and mildly elevated PA pressure of 40 mm Hg.   Has not been in the ED or admitted in the last 6 months.    He presents today with a chief complaint of a follow-up visit. He currently has no symptoms and specifically denies any difficulty sleeping, dizziness, abdominal distention, palpitations, pedal edema, chest pain, shortness of breath, cough, fatigue or weight gain.   Has not been weighing himself as he says that his scales are broke. Thinks that he goes to see his PCP next month.   Past Medical History:  Diagnosis Date   Chronic systolic CHF (congestive heart failure) ([facility]) 06/2017   a. TTE 11/18: EF of 20-25% , difuse hypokinesis, mild to moderate tricuspid regurgitation, and mild pulmonary hypertension   Coronary artery disease 06/2017   Non-ST elevation myocardial infarction.  Treated medically with no cardiac cath done due to active infection and renal failure.   Dermatitis    HIV (human immunodeficiency virus infection) ([facility])    Hypertension    Pulmonary hypertension ([facility])    a. TTE 11/18: EF of 20-25% , difuse hypokinesis, mild to moderate tricuspid regurgitation, and mild pulmonary hypertension w/ PASP 40 mmHg   Past Surgical History:  Procedure Laterality Date   APPENDECTOMY     IR RADIOLOGIST EVAL & MGMT  06/19/2017   Family History  Problem Relation Age of Onset   Cerebral aneurysm Brother        died   Hypertension Mother    Diabetes Mother    Heart attack Father    Social History   Tobacco Use   Smoking status: Some Days    Packs/day: 0.50    Types: Cigarettes    Last attempt to quit: 06/11/2017    Years  since quitting: 4.7   Smokeless tobacco: Never   Tobacco comments:    02/17/2018- Smokes 2 cigarettes a day  Substance Use Topics   Alcohol use: Yes    Alcohol/week: 2.0 standard drinks of alcohol    Types: 2 Cans of beer per week   No Known Allergies  Prior to Admission medications   Medication Sig Start Date End Date Taking? Authorizing Provider  aspirin 81 MG EC tablet Take 1 tablet (81 mg total) by mouth daily. 06/24/18  Yes Amaurie Schreckengost A, FNP  bictegravir-emtricitabine-tenofovir AF (BIKTARVY) 50-200-25 MG TABS tablet Take 1 tablet by mouth daily.   Yes [provider]  carvedilol (COREG) 6.25 MG tablet Take 1 tablet (6.25 mg total) by mouth 2 (two) times daily. 07/21/19  Yes Larkin Alfred, Inetta Fermo A, FNP  cetirizine (ZYRTEC) 10 MG tablet Take 10 mg by mouth daily. PRN   Yes [provider]  ENTRESTO 24-26 MG Take 1 tablet by mouth twice daily 09/15/21  Yes Clarisa Kindred A, FNP  furosemide (LASIX) 20 MG tablet Take 20 mg by mouth every morning. 02/17/22  Yes [provider]  glipiZIDE (GLUCOTROL) 5 MG tablet Take 5 mg by mouth daily before breakfast.   Yes [provider]  Ibuprofen-diphenhydrAMINE Cit (ADVIL PM) 200-38 MG TABS Take 2 tablets by mouth at bedtime  as needed (tooth pain).   Yes [provider]  metFORMIN (GLUCOPHAGE) 1000 MG tablet Take 1,000 mg by mouth 2 (two) times daily with a meal.   Yes [provider]  nitroGLYCERIN (NITROSTAT) 0.4 MG SL tablet Place 1 tablet (0.4 mg total) under the tongue every 5 (five) minutes as needed for chest pain. 06/24/18  Yes Anapaula Severt, Inetta Fermo A, FNP  atorvastatin (LIPITOR) 40 MG tablet Take 1 tablet (40 mg total) by mouth daily. 02/22/22   Delma Freeze, FNP  torsemide (DEMADEX) 20 MG tablet Take 1 tablet (20 mg total) by mouth daily. May take 1 additional tablet (20 mg) as needed for shortness of breath/weight gain of 3 lbs. Patient not taking: Reported on 02/22/2022 07/21/19   Delma Freeze, FNP    Review of Systems  Constitutional:  Negative for appetite change and fatigue.  HENT:  Negative for congestion, postnasal drip and sore throat.   Eyes: Negative.   Respiratory:  Negative for cough, chest tightness and shortness of breath.   Cardiovascular:  Negative for chest pain, palpitations and leg swelling.  Gastrointestinal:  Negative for abdominal distention and abdominal pain.  Endocrine: Negative.   Genitourinary: Negative.   Musculoskeletal:  Negative for back pain and neck pain.  Skin: Negative.   Allergic/Immunologic: Negative.   Neurological:  Negative for dizziness and light-headedness.  Hematological:  Negative for adenopathy. Does not bruise/bleed easily.  Psychiatric/Behavioral:  Negative for dysphoric mood and sleep disturbance. The patient is not nervous/anxious.    Vitals:   02/22/22 1145  BP: 105/76  Pulse: 77  Resp: 16  SpO2: 99%  Weight: 159 lb 4 oz (72.2 kg)  Height: 6\' 1"  (1.854 m)   Wt Readings from Last 3 Encounters:  02/22/22 159 lb 4 oz (72.2 kg)  03/14/21 158 lb 8 oz (71.9 kg)  08/30/20 171 lb 8 oz (77.8 kg)   Lab Results  Component Value Date   CREATININE 2.18 (H) 10/03/2017   CREATININE 2.34 (H) 09/03/2017   CREATININE 2.36 (H) 09/02/2017    Physical Exam Vitals and nursing note reviewed. Exam conducted with a chaperone present (girlfriend).  Constitutional:      Appearance: Normal appearance.  HENT:     Head: Normocephalic and atraumatic.  Cardiovascular:     Rate and Rhythm: Normal rate.  Pulmonary:     Effort: Pulmonary effort is normal. No respiratory distress.     Breath sounds: No wheezing or rales.  Abdominal:     General: There is no distension.     Palpations: Abdomen is soft.     Tenderness: There is no abdominal tenderness.  Musculoskeletal:        General: No tenderness.     Cervical back: Normal range of motion and neck supple.     Right lower leg: No edema.     Left lower leg: No edema.  Skin:    General: Skin  is warm and dry.  Neurological:     General: No focal deficit present.     Mental Status: He is alert and oriented to person, place, and time.  Psychiatric:        Mood and Affect: Mood normal.        Behavior: Behavior normal.        Thought Content: Thought content normal.    Assessment & Plan:  1: Chronic heart failure with preserved ejection fraction- - NYHA class I - euvolemic today - not weighing as his scales are broke; set given  to him today and instructed him to call for an overnight weight gain of >2 pounds or a weekly weight gain of >5 pounds - weight stable from last visit here 11 months ago - not eating as much due to the hot weather; says that he has plenty to eat and denies any food insecurities - not adding salt and is using Mrs Sharilyn Sites for seasoning. Reminded to closely follow a 2000mg  sodium diet - saw cardiology ([facility]) 10/03/17 - have scheduled echo for 03/11/22 - refilled atorvastatin  2: HTN- - BP looks good (105/76) - BMP from 08/24/20 reviewed and showed sodium 140, potassium 5.1, creatinine 1.29 and GFR 71  - follows at [facility]; will need to see if lab work has been done; if not do at next visit  3: Tobacco use- - smoking 1/2 ppd cigarettes daily - drinks 2-3 twelve ounce beers a few times/ week - complete cessation discussed for 3 minutes with patient   Medication bottles reviewed.   Return in 1 month, sooner if needed.

## 2022-02-22 NOTE — Patient Instructions (Addendum)
Resume weighing daily and call for an overnight weight gain of 3 pounds or more or a weekly weight gain of more than 5 pounds.   If you have voicemail, please make sure your mailbox is cleaned out so that we may leave a message and please make sure to listen to any voicemails.     

## 2022-03-06 ENCOUNTER — Telehealth: Payer: Self-pay | Admitting: Family

## 2022-03-06 NOTE — Telephone Encounter (Signed)
Unable to Reach patient in attempt to remind him of his upcomming Echo.   Manuel Rosales, NT

## 2022-03-11 ENCOUNTER — Ambulatory Visit: Payer: Medicaid Other

## 2022-03-21 ENCOUNTER — Ambulatory Visit: Payer: Medicaid Other | Admitting: Family

## 2022-03-21 NOTE — Progress Notes (Deleted)
Patient ID: Manuel Rosales, male    DOB: [birth year], 56 y.o.   MRN: [id number]  HPI  Manuel Rosales is a 56 y/o male with a history of HTN, HIV, psoas abscess, current tobacco use and chronic heart failure.   Echo report from 08/25/2018 reviewed and showed an EF of 50-55%. Echo report from 06/09/17 reviewed and shows an EF of 20-25% along with mild/mod TR and mildly elevated PA pressure of 40 mm Hg.   Has not been in the ED or admitted in the last 6 months.    He presents today for a follow-up visit. He currently has no symptoms and specifically denies any difficulty sleeping, dizziness, abdominal distention, palpitations, pedal edema, chest pain, shortness of breath, cough, fatigue or weight gain.   Has not been weighing himself as he says that his scales are broke. Thinks that he goes to see his PCP next month.   Past Medical History:  Diagnosis Date   Chronic systolic CHF (congestive heart failure) ([facility]) 06/2017   a. TTE 11/18: EF of 20-25% , difuse hypokinesis, mild to moderate tricuspid regurgitation, and mild pulmonary hypertension   Coronary artery disease 06/2017   Non-ST elevation myocardial infarction.  Treated medically with no cardiac cath done due to active infection and renal failure.   Dermatitis    HIV (human immunodeficiency virus infection) ([facility])    Hypertension    Pulmonary hypertension ([facility])    a. TTE 11/18: EF of 20-25% , difuse hypokinesis, mild to moderate tricuspid regurgitation, and mild pulmonary hypertension w/ PASP 40 mmHg   Past Surgical History:  Procedure Laterality Date   APPENDECTOMY     IR RADIOLOGIST EVAL & MGMT  06/19/2017   Family History  Problem Relation Age of Onset   Cerebral aneurysm Brother        died   Hypertension Mother    Diabetes Mother    Heart attack Father    Social History   Tobacco Use   Smoking status: Some Days    Packs/day: 0.50    Types: Cigarettes    Last attempt to quit: 06/11/2017    Years since quitting: 4.7    Smokeless tobacco: Never   Tobacco comments:    02/17/2018- Smokes 2 cigarettes a day  Substance Use Topics   Alcohol use: Yes    Alcohol/week: 2.0 standard drinks of alcohol    Types: 2 Cans of beer per week   No Known Allergies  Prior to Admission medications   Medication Sig Start Date End Date Taking? Authorizing Provider  aspirin 81 MG EC tablet Take 1 tablet (81 mg total) by mouth daily. 06/24/18  Yes Addalynn Kumari A, FNP  bictegravir-emtricitabine-tenofovir AF (BIKTARVY) 50-200-25 MG TABS tablet Take 1 tablet by mouth daily.   Yes [provider]  carvedilol (COREG) 6.25 MG tablet Take 1 tablet (6.25 mg total) by mouth 2 (two) times daily. 07/21/19  Yes Chaquana Nichols, Inetta Fermo A, FNP  cetirizine (ZYRTEC) 10 MG tablet Take 10 mg by mouth daily. PRN   Yes [provider]  ENTRESTO 24-26 MG Take 1 tablet by mouth twice daily 09/15/21  Yes Clarisa Kindred A, FNP  furosemide (LASIX) 20 MG tablet Take 20 mg by mouth every morning. 02/17/22  Yes [provider]  glipiZIDE (GLUCOTROL) 5 MG tablet Take 5 mg by mouth daily before breakfast.   Yes [provider]  Ibuprofen-diphenhydrAMINE Cit (ADVIL PM) 200-38 MG TABS Take 2 tablets by mouth at bedtime as needed (tooth pain).  Yes [provider]  metFORMIN (GLUCOPHAGE) 1000 MG tablet Take 1,000 mg by mouth 2 (two) times daily with a meal.   Yes [provider]  nitroGLYCERIN (NITROSTAT) 0.4 MG SL tablet Place 1 tablet (0.4 mg total) under the tongue every 5 (five) minutes as needed for chest pain. 06/24/18  Yes Loyola Santino, Inetta Fermo A, FNP  atorvastatin (LIPITOR) 40 MG tablet Take 1 tablet (40 mg total) by mouth daily. 02/22/22   Delma Freeze, FNP  torsemide (DEMADEX) 20 MG tablet Take 1 tablet (20 mg total) by mouth daily. May take 1 additional tablet (20 mg) as needed for shortness of breath/weight gain of 3 lbs. Patient not taking: Reported on 02/22/2022 07/21/19   Delma Freeze, FNP   Review of Systems   Constitutional:  Negative for appetite change and fatigue.  HENT:  Negative for congestion, postnasal drip and sore throat.   Eyes: Negative.   Respiratory:  Negative for cough, chest tightness and shortness of breath.   Cardiovascular:  Negative for chest pain, palpitations and leg swelling.  Gastrointestinal:  Negative for abdominal distention and abdominal pain.  Endocrine: Negative.   Genitourinary: Negative.   Musculoskeletal:  Negative for back pain and neck pain.  Skin: Negative.   Allergic/Immunologic: Negative.   Neurological:  Negative for dizziness and light-headedness.  Hematological:  Negative for adenopathy. Does not bruise/bleed easily.  Psychiatric/Behavioral:  Negative for dysphoric mood and sleep disturbance. The patient is not nervous/anxious.    There were no vitals filed for this visit.  Wt Readings from Last 3 Encounters:  02/22/22 159 lb 4 oz (72.2 kg)  03/14/21 158 lb 8 oz (71.9 kg)  08/30/20 171 lb 8 oz (77.8 kg)   Lab Results  Component Value Date   CREATININE 2.18 (H) 10/03/2017   CREATININE 2.34 (H) 09/03/2017   CREATININE 2.36 (H) 09/02/2017    Physical Exam Vitals and nursing note reviewed. Exam conducted with a chaperone present (girlfriend).  Constitutional:      Appearance: Normal appearance.  HENT:     Head: Normocephalic and atraumatic.  Cardiovascular:     Rate and Rhythm: Normal rate.  Pulmonary:     Effort: Pulmonary effort is normal. No respiratory distress.     Breath sounds: No wheezing or rales.  Abdominal:     General: There is no distension.     Palpations: Abdomen is soft.     Tenderness: There is no abdominal tenderness.  Musculoskeletal:        General: No tenderness.     Cervical back: Normal range of motion and neck supple.     Right lower leg: No edema.     Left lower leg: No edema.  Skin:    General: Skin is warm and dry.  Neurological:     General: No focal deficit present.     Mental Status: He is alert and  oriented to person, place, and time.  Psychiatric:        Mood and Affect: Mood normal.        Behavior: Behavior normal.        Thought Content: Thought content normal.    Assessment & Plan:  1: Chronic heart failure with preserved ejection fraction- - NYHA class I - euvolemic today - not weighing as his scales are broke; set given to him today and instructed him to call for an overnight weight gain of >2 pounds or a weekly weight gain of >5 pounds - weight stable from last visit  here 11 months ago - not eating as much due to the hot weather; says that he has plenty to eat and denies any food insecurities - not adding salt and is using Mrs Sharilyn Sites for seasoning. Reminded to closely follow a 2000mg  sodium diet - saw cardiology ([facility]) 10/03/17 - have scheduled echo for 03/11/22 - refilled atorvastatin  2: HTN- - BP looks good () - BMP from 08/24/20 reviewed and showed sodium 140, potassium 5.1, creatinine 1.29 and GFR 71  - follows at [facility]; will need to see if lab work has been done; if not do at next visit  3: Tobacco use- - smoking 1/2 ppd cigarettes daily - drinks 2-3 twelve ounce beers a few times/ week - complete cessation discussed for 3 minutes with patient   Medication bottles reviewed.   Return in  month, sooner if needed.

## 2022-03-27 ENCOUNTER — Other Ambulatory Visit: Payer: Self-pay | Admitting: Family

## 2022-04-28 NOTE — Progress Notes (Deleted)
Patient ID: Manuel Rosales, male    DOB: 1966-02-21, 56 y.o.   MRN: [id number]  HPI  Manuel Rosales is a 56 y/o male with a history of HTN, HIV, psoas abscess, current tobacco use and chronic heart failure.   Echo report from 08/25/2018 reviewed and showed an EF of 50-55%. Echo report from 06/09/17 reviewed and shows an EF of 20-25% along with mild/mod TR and mildly elevated PA pressure of 40 mm Hg.   Has not been in the ED or admitted in the last 6 months.    He presents today with a chief complaint of a follow-up visit.   Past Medical History:  Diagnosis Date   Chronic systolic CHF (congestive heart failure) ([place]) 06/2017   a. TTE 11/18: EF of 20-25% , difuse hypokinesis, mild to moderate tricuspid regurgitation, and mild pulmonary hypertension   Coronary artery disease 06/2017   Non-ST elevation myocardial infarction.  Treated medically with no cardiac cath done due to active infection and renal failure.   Dermatitis    HIV (human immunodeficiency virus infection) ([place])    Hypertension    Pulmonary hypertension ([place])    a. TTE 11/18: EF of 20-25% , difuse hypokinesis, mild to moderate tricuspid regurgitation, and mild pulmonary hypertension w/ PASP 40 mmHg   Past Surgical History:  Procedure Laterality Date   APPENDECTOMY     IR RADIOLOGIST EVAL & MGMT  06/19/2017   Family History  Problem Relation Age of Onset   Cerebral aneurysm Brother        died   Hypertension Mother    Diabetes Mother    Heart attack Father    Social History   Tobacco Use   Smoking status: Some Days    Packs/day: 0.50    Types: Cigarettes    Last attempt to quit: 06/11/2017    Years since quitting: 4.8   Smokeless tobacco: Never   Tobacco comments:    02/17/2018- Smokes 2 cigarettes a day  Substance Use Topics   Alcohol use: Yes    Alcohol/week: 2.0 standard drinks of alcohol    Types: 2 Cans of beer per week   No Known Allergies   Review of Systems  Constitutional:  Negative for appetite  change and fatigue.  HENT:  Negative for congestion, postnasal drip and sore throat.   Eyes: Negative.   Respiratory:  Negative for cough, chest tightness and shortness of breath.   Cardiovascular:  Negative for chest pain, palpitations and leg swelling.  Gastrointestinal:  Negative for abdominal distention and abdominal pain.  Endocrine: Negative.   Genitourinary: Negative.   Musculoskeletal:  Negative for back pain and neck pain.  Skin: Negative.   Allergic/Immunologic: Negative.   Neurological:  Negative for dizziness and light-headedness.  Hematological:  Negative for adenopathy. Does not bruise/bleed easily.  Psychiatric/Behavioral:  Negative for dysphoric mood and sleep disturbance. The patient is not nervous/anxious.       Physical Exam Vitals and nursing note reviewed. Exam conducted with a chaperone present (girlfriend).  Constitutional:      Appearance: Normal appearance.  HENT:     Head: Normocephalic and atraumatic.  Cardiovascular:     Rate and Rhythm: Normal rate.  Pulmonary:     Effort: Pulmonary effort is normal. No respiratory distress.     Breath sounds: No wheezing or rales.  Abdominal:     General: There is no distension.     Palpations: Abdomen is soft.     Tenderness: There is no abdominal  tenderness.  Musculoskeletal:        General: No tenderness.     Cervical back: Normal range of motion and neck supple.     Right lower leg: No edema.     Left lower leg: No edema.  Skin:    General: Skin is warm and dry.  Neurological:     General: No focal deficit present.     Mental Status: He is alert and oriented to person, place, and time.  Psychiatric:        Mood and Affect: Mood normal.        Behavior: Behavior normal.        Thought Content: Thought content normal.    Assessment & Plan:  1: Chronic heart failure with preserved ejection fraction- - NYHA class I - euvolemic today - not weighing as his scales are broke; set given to him today and  instructed him to call for an overnight weight gain of >2 pounds or a weekly weight gain of >5 pounds - weight 159.4 from last visit here 2 months ago - not eating as much due to the hot weather; says that he has plenty to eat and denies any food insecurities - not adding salt and is using Mrs Sharilyn Sites for seasoning. Reminded to closely follow a 2000mg  sodium diet - saw cardiology ([facility]) 10/03/17 - NS for echo on 03/11/22/ r/s for   2: HTN- - BP  - BMP from 08/24/20 reviewed and showed sodium 140, potassium 5.1, creatinine 1.29 and GFR 71  - follows at [facility] - check BMP/ lipid panel today  3: Tobacco use- - smoking 1/2 ppd cigarettes daily - drinks 2-3 twelve ounce beers a few times/ week - complete cessation discussed for 3 minutes with patient   Medication bottles reviewed.

## 2022-04-29 ENCOUNTER — Ambulatory Visit: Payer: Medicaid Other | Admitting: Family

## 2022-04-29 ENCOUNTER — Telehealth: Payer: Self-pay | Admitting: Family

## 2022-04-29 NOTE — Telephone Encounter (Signed)
Patient did not show for his [facility] appointment on 04/29/22. Will attempt to reschedule.

## 2022-07-15 ENCOUNTER — Telehealth: Payer: Self-pay | Admitting: Family

## 2022-07-15 NOTE — Telephone Encounter (Signed)
Spoke with patient stating we received a refill request for his entresto and that we cannot fill it without seeing patient as he has missed his last several appointments. Patient is now scheduled for tomorrow 07/16/22 at 4pm.   [name], NT

## 2022-07-16 ENCOUNTER — Ambulatory Visit: Payer: Medicaid Other | Attending: Family | Admitting: Family

## 2022-07-16 ENCOUNTER — Encounter: Payer: Self-pay | Admitting: Family

## 2022-07-16 VITALS — BP 124/83 | HR 84 | Resp 20 | Wt 160.0 lb

## 2022-07-16 DIAGNOSIS — I5042 Chronic combined systolic (congestive) and diastolic (congestive) heart failure: Secondary | ICD-10-CM | POA: Diagnosis present

## 2022-07-16 DIAGNOSIS — I10 Essential (primary) hypertension: Secondary | ICD-10-CM

## 2022-07-16 DIAGNOSIS — F17210 Nicotine dependence, cigarettes, uncomplicated: Secondary | ICD-10-CM | POA: Diagnosis not present

## 2022-07-16 DIAGNOSIS — I110 Hypertensive heart disease with heart failure: Secondary | ICD-10-CM | POA: Diagnosis not present

## 2022-07-16 DIAGNOSIS — Z720 Tobacco use: Secondary | ICD-10-CM | POA: Diagnosis not present

## 2022-07-16 DIAGNOSIS — Z79899 Other long term (current) drug therapy: Secondary | ICD-10-CM | POA: Insufficient documentation

## 2022-07-16 DIAGNOSIS — I5032 Chronic diastolic (congestive) heart failure: Secondary | ICD-10-CM | POA: Diagnosis not present

## 2022-07-16 DIAGNOSIS — Z21 Asymptomatic human immunodeficiency virus [HIV] infection status: Secondary | ICD-10-CM | POA: Diagnosis not present

## 2022-07-16 DIAGNOSIS — I2510 Atherosclerotic heart disease of native coronary artery without angina pectoris: Secondary | ICD-10-CM | POA: Insufficient documentation

## 2022-07-16 DIAGNOSIS — Z7984 Long term (current) use of oral hypoglycemic drugs: Secondary | ICD-10-CM | POA: Insufficient documentation

## 2022-07-16 NOTE — Patient Instructions (Addendum)
Continue weighing daily and call for an overnight weight gain of 3 pounds or more or a weekly weight gain of more than 5 pounds.   If you have voicemail, please make sure your mailbox is cleaned out so that we may leave a message and please make sure to listen to any voicemails.    Ask [facility] to fax lab results to [place] at [phone]

## 2022-07-16 NOTE — Progress Notes (Signed)
Patient ID: Manuel Rosales, male    DOB: 12-10-1965, 56 y.o.   MRN: [id number]  HPI  Manuel Rosales is a 56 y/o male with a history of HTN, HIV, psoas abscess, current tobacco use and chronic heart failure.   Echo report from 08/25/2018 reviewed and showed an EF of 50-55%. Echo report from 06/09/17 reviewed and shows an EF of 20-25% along with mild/mod TR and mildly elevated PA pressure of 40 mm Hg.   Has not been in the ED or admitted in the last 6 months.    He presents today with a chief complaint of a follow-up visit. He currently has no symptoms and specifically denies any difficulty sleeping, dizziness, abdominal distention, palpitations, pedal edema, chest pain, shortness of breath, cough, fatigue or weight gain.   Past Medical History:  Diagnosis Date   Chronic systolic CHF (congestive heart failure) ([facility]) 06/2017   a. TTE 11/18: EF of 20-25% , difuse hypokinesis, mild to moderate tricuspid regurgitation, and mild pulmonary hypertension   Coronary artery disease 06/2017   Non-ST elevation myocardial infarction.  Treated medically with no cardiac cath done due to active infection and renal failure.   Dermatitis    HIV (human immunodeficiency virus infection) ([facility])    Hypertension    Pulmonary hypertension ([facility])    a. TTE 11/18: EF of 20-25% , difuse hypokinesis, mild to moderate tricuspid regurgitation, and mild pulmonary hypertension w/ PASP 40 mmHg   Past Surgical History:  Procedure Laterality Date   APPENDECTOMY     IR RADIOLOGIST EVAL & MGMT  06/19/2017   Family History  Problem Relation Age of Onset   Cerebral aneurysm Brother        died   Hypertension Mother    Diabetes Mother    Heart attack Father    Social History   Tobacco Use   Smoking status: Some Days    Packs/day: 0.50    Types: Cigarettes    Last attempt to quit: 06/11/2017    Years since quitting: 5.0   Smokeless tobacco: Never   Tobacco comments:    02/17/2018- Smokes 2 cigarettes a day  Substance  Use Topics   Alcohol use: Yes    Alcohol/week: 2.0 standard drinks of alcohol    Types: 2 Cans of beer per week   No Known Allergies  Prior to Admission medications   Medication Sig Start Date End Date Taking? Authorizing Provider  aspirin 81 MG EC tablet Take 1 tablet (81 mg total) by mouth daily. 06/24/18  Yes Myking Sar, Inetta Fermo A, FNP  atorvastatin (LIPITOR) 40 MG tablet Take 1 tablet (40 mg total) by mouth daily. 02/22/22  Yes Ylianna Almanzar A, FNP  bictegravir-emtricitabine-tenofovir AF (BIKTARVY) 50-200-25 MG TABS tablet Take 1 tablet by mouth daily.   Yes [provider]  carvedilol (COREG) 6.25 MG tablet Take 1 tablet (6.25 mg total) by mouth 2 (two) times daily. 07/21/19  Yes Jeryn Cerney, Inetta Fermo A, FNP  cetirizine (ZYRTEC) 10 MG tablet Take 10 mg by mouth daily. PRN   Yes [provider]  ENTRESTO 24-26 MG Take 1 tablet by mouth twice daily 03/27/22  Yes Clarisa Kindred A, FNP  furosemide (LASIX) 20 MG tablet Take 20 mg by mouth every morning. 02/17/22  Yes [provider]  glipiZIDE (GLUCOTROL) 5 MG tablet Take 5 mg by mouth daily before breakfast.   Yes [provider]  metFORMIN (GLUCOPHAGE) 1000 MG tablet Take 1,000 mg by mouth 2 (two) times daily with a meal.  Yes [provider]  nitroGLYCERIN (NITROSTAT) 0.4 MG SL tablet Place 1 tablet (0.4 mg total) under the tongue every 5 (five) minutes as needed for chest pain. 06/24/18  Yes Coreon Simkins A, FNP  Ibuprofen-diphenhydrAMINE Cit (ADVIL PM) 200-38 MG TABS Take 2 tablets by mouth at bedtime as needed (tooth pain). Patient not taking: Reported on 07/16/2022    [provider]    Review of Systems  Constitutional:  Negative for appetite change and fatigue.  HENT:  Negative for congestion, postnasal drip and sore throat.   Eyes: Negative.   Respiratory:  Negative for cough, chest tightness and shortness of breath.   Cardiovascular:  Negative for chest pain, palpitations and leg swelling.   Gastrointestinal:  Negative for abdominal distention and abdominal pain.  Endocrine: Negative.   Genitourinary: Negative.   Musculoskeletal:  Negative for back pain and neck pain.  Skin: Negative.   Allergic/Immunologic: Negative.   Neurological:  Negative for dizziness and light-headedness.  Hematological:  Negative for adenopathy. Does not bruise/bleed easily.  Psychiatric/Behavioral:  Negative for dysphoric mood and sleep disturbance. The patient is not nervous/anxious.    Vitals:   07/16/22 1412  BP: 124/83  Pulse: 84  Resp: 20  SpO2: 100%  Weight: 160 lb (72.6 kg)   Wt Readings from Last 3 Encounters:  07/16/22 160 lb (72.6 kg)  02/22/22 159 lb 4 oz (72.2 kg)  03/14/21 158 lb 8 oz (71.9 kg)   Lab Results  Component Value Date   CREATININE 2.18 (H) 10/03/2017   CREATININE 2.34 (H) 09/03/2017   CREATININE 2.36 (H) 09/02/2017   Physical Exam Vitals and nursing note reviewed.  Constitutional:      Appearance: Normal appearance.  HENT:     Head: Normocephalic and atraumatic.  Cardiovascular:     Rate and Rhythm: Normal rate.  Pulmonary:     Effort: Pulmonary effort is normal. No respiratory distress.     Breath sounds: No wheezing or rales.  Abdominal:     General: There is no distension.     Palpations: Abdomen is soft.     Tenderness: There is no abdominal tenderness.  Musculoskeletal:        General: No tenderness.     Cervical back: Normal range of motion and neck supple.     Right lower leg: No edema.     Left lower leg: No edema.  Skin:    General: Skin is warm and dry.  Neurological:     General: No focal deficit present.     Mental Status: He is alert and oriented to person, place, and time.  Psychiatric:        Mood and Affect: Mood normal.        Behavior: Behavior normal.        Thought Content: Thought content normal.    Assessment & Plan:  1: Chronic heart failure with preserved ejection fraction- - NYHA class I - euvolemic today - not  weighing daily; encouraged to resume so that he can call for an overnight weight gain of >2 pounds or a weekly weight gain of >5 pounds - weight up 1.4 pounds from last visit here 5 months ago - not adding salt and is using Mrs Sharilyn Sites for seasoning. Reminded to closely follow a 2000mg  sodium diet - saw cardiology ([facility]) 10/03/17 - NS for echo on 03/11/22; this has been r/s for 08/16/21  2: HTN- - BP looks good (124/83) - BMP 08/24/20 reviewed and showed sodium 140, potassium  5.1, creatinine 1.29 and GFR 71  - follows at [facility] & has appt there next week - our fax number was written on his AVS so that his PCP can send results to [place]; if not, will plan to check them at next visit  3: Tobacco use- - smoking 1/2 ppd cigarettes daily - drinks 2-3 twelve ounce beers a few times/ week - complete cessation discussed for 3 minutes with patient   Medication bottles reviewed.   Return in 1 month, sooner if needed.

## 2022-07-31 ENCOUNTER — Other Ambulatory Visit: Payer: Self-pay

## 2022-07-31 ENCOUNTER — Emergency Department
Admission: EM | Admit: 2022-07-31 | Discharge: 2022-07-31 | Disposition: A | Discharge status: Home / Self Care | Payer: Medicaid Other | Attending: Student in an Organized Health Care Education/Training Program | Admitting: Student in an Organized Health Care Education/Training Program

## 2022-07-31 DIAGNOSIS — S39012A Strain of muscle, fascia and tendon of lower back, initial encounter: Secondary | ICD-10-CM | POA: Insufficient documentation

## 2022-07-31 DIAGNOSIS — Y92410 Unspecified street and highway as the place of occurrence of the external cause: Secondary | ICD-10-CM | POA: Diagnosis not present

## 2022-07-31 DIAGNOSIS — S3992XA Unspecified injury of lower back, initial encounter: Secondary | ICD-10-CM | POA: Diagnosis present

## 2022-07-31 MED ORDER — CYCLOBENZAPRINE HCL 5 MG PO TABS: 5.0000 mg | ORAL_TABLET | Freq: Three times a day (TID) | ORAL | 0 refills | Status: DC | PRN

## 2022-07-31 MED ORDER — IBUPROFEN 800 MG PO TABS: 800.0000 mg | ORAL_TABLET | Freq: Three times a day (TID) | ORAL | 0 refills | Status: DC | PRN
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
## 2022-08-16 ENCOUNTER — Ambulatory Visit: Admission: RE | Admit: 2022-08-16 | Payer: No Typology Code available for payment source | Source: Ambulatory Visit

## 2022-08-16 ENCOUNTER — Telehealth: Payer: Self-pay | Admitting: Family

## 2022-08-16 ENCOUNTER — Encounter: Payer: Medicaid Other | Admitting: Family
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
## 2022-08-16 NOTE — Telephone Encounter (Signed)
Patient did not show for his Heart Failure Clinic appointment or echo on 08/16/22. Will attempt to reschedule.

## 2023-02-18 ENCOUNTER — Other Ambulatory Visit: Payer: Self-pay | Admitting: Family

## 2023-02-25 NOTE — Progress Notes (Unsigned)
PCP: Primary Cardiologist:  HPI:  Manuel Rosales is a 57 y/o male with a history of HTN, HIV, psoas abscess, current tobacco use and chronic heart failure.   Echo report from 08/25/2018 reviewed and showed an EF of 50-55%. Echo report from 06/09/17 reviewed and shows an EF of 20-25% along with mild/mod TR and mildly elevated PA pressure of 40 mm Hg.   Has not been in the ED or admitted in the last 6 months.    He presents today with a chief complaint of a follow-up visit. He currently has no symptoms and specifically denies any difficulty sleeping, dizziness, abdominal distention, palpitations, pedal edema, chest pain, shortness of breath, cough, fatigue or weight gain.      ROS: All systems negative except as listed in HPI, PMH and Problem List.  SH:  Social History   Socioeconomic History   Marital status: Single    Spouse name: Not on file   Number of children: 2   Years of education: 54   Highest education level: 12th grade  Occupational History   Not on file  Tobacco Use   Smoking status: Some Days    Current packs/day: 0.00    Types: Cigarettes    Last attempt to quit: 06/11/2017    Years since quitting: 5.7   Smokeless tobacco: Never   Tobacco comments:    02/17/2018- Smokes 2 cigarettes a day  Vaping Use   Vaping status: Never Used  Substance and Sexual Activity   Alcohol use: Yes    Alcohol/week: 2.0 standard drinks of alcohol    Types: 2 Cans of beer per week   Drug use: Not Currently    Types: Marijuana    Comment: Quit a while ago   Sexual activity: Not Currently    Birth control/protection: None  Other Topics Concern   Not on file  Social History Narrative   Not on file   Social Determinants of Health   Financial Resource Strain: Low Risk  (02/22/2022)   Overall Financial Resource Strain (CARDIA)    Difficulty of Paying Living Expenses: Not very hard  Food Insecurity: No Food Insecurity (02/22/2022)   Hunger Vital Sign    Worried About Running Out of  Food in the Last Year: Never true    Ran Out of Food in the Last Year: Never true  Transportation Needs: No Transportation Needs (02/22/2022)   PRAPARE - Administrator, Civil Service (Medical): No    Lack of Transportation (Non-Medical): No  Physical Activity: Inactive (06/18/2017)   Exercise Vital Sign    Days of Exercise per Week: 0 days    Minutes of Exercise per Session: 0 min  Stress: Stress Concern Present (06/18/2017)   [employer] of Occupational Health - Occupational Stress Questionnaire    Feeling of Stress : To some extent  Social Connections: Moderately Isolated (06/18/2017)   Social Connection and Isolation Panel [NHANES]    Frequency of Communication with Friends and Family: Three times a week    Frequency of Social Gatherings with Friends and Family: Once a week    Attends Religious Services: Never    Database administrator or Organizations: No    Attends Banker Meetings: Never    Marital Status: Never married  Intimate Partner Violence: Not At Risk (06/18/2017)   Humiliation, Afraid, Rape, and Kick questionnaire    Fear of Current or Ex-Partner: No    Emotionally Abused: No    Physically Abused: No  Sexually Abused: No    FH:  Family History  Problem Relation Age of Onset   Cerebral aneurysm Brother        died   Hypertension Mother    Diabetes Mother    Heart attack Father     Past Medical History:  Diagnosis Date   Chronic systolic CHF (congestive heart failure) (HCC) 06/2017   a. TTE 11/18: EF of 20-25% , difuse hypokinesis, mild to moderate tricuspid regurgitation, and mild pulmonary hypertension   Coronary artery disease 06/2017   Non-ST elevation myocardial infarction.  Treated medically with no cardiac cath done due to active infection and renal failure.   Dermatitis    HIV (human immunodeficiency virus infection) (HCC)    Hypertension    Pulmonary hypertension (HCC)    a. TTE 11/18: EF of 20-25% , difuse  hypokinesis, mild to moderate tricuspid regurgitation, and mild pulmonary hypertension w/ PASP 40 mmHg    Current Outpatient Medications  Medication Sig Dispense Refill   aspirin 81 MG EC tablet Take 1 tablet (81 mg total) by mouth daily. 90 tablet 3   atorvastatin (LIPITOR) 40 MG tablet Take 1 tablet by mouth once daily 30 tablet 1   bictegravir-emtricitabine-tenofovir AF (BIKTARVY) 50-200-25 MG TABS tablet Take 1 tablet by mouth daily.     carvedilol (COREG) 6.25 MG tablet Take 1 tablet (6.25 mg total) by mouth 2 (two) times daily. 180 tablet 3   cetirizine (ZYRTEC) 10 MG tablet Take 10 mg by mouth daily. PRN     cyclobenzaprine (FLEXERIL) 5 MG tablet Take 1 tablet (5 mg total) by mouth 3 (three) times daily as needed. 15 tablet 0   ENTRESTO 24-26 MG Take 1 tablet by mouth twice daily 180 tablet 3   furosemide (LASIX) 20 MG tablet Take 20 mg by mouth every morning.     glipiZIDE (GLUCOTROL) 5 MG tablet Take 5 mg by mouth daily before breakfast.     ibuprofen (ADVIL) 800 MG tablet Take 1 tablet (800 mg total) by mouth every 8 (eight) hours as needed. 30 tablet 0   metFORMIN (GLUCOPHAGE) 1000 MG tablet Take 1,000 mg by mouth 2 (two) times daily with a meal.     nitroGLYCERIN (NITROSTAT) 0.4 MG SL tablet Place 1 tablet (0.4 mg total) under the tongue every 5 (five) minutes as needed for chest pain. 30 tablet 1   No current facility-administered medications for this visit.      PHYSICAL EXAM:  General:  Well appearing. No resp difficulty HEENT: normal Neck: supple. JVP flat. Carotids 2+ bilaterally; no bruits. No lymphadenopathy or thryomegaly appreciated. Cor: PMI normal. Regular rate & rhythm. No rubs, gallops or murmurs. Lungs: clear Abdomen: soft, nontender, nondistended. No hepatosplenomegaly. No bruits or masses. Good bowel sounds. Extremities: no cyanosis, clubbing, rash, edema Neuro: alert & orientedx3, cranial nerves grossly intact. Moves all 4 extremities w/o difficulty.  Affect pleasant.   ECG:   ASSESSMENT & PLAN:  1: Chronic heart failure with preserved ejection fraction- - NYHA class I - euvolemic today - not weighing daily; encouraged to resume so that he can call for an overnight weight gain of >2 pounds or a weekly weight gain of >5 pounds - weight up 1.4 pounds from last visit here 5 months ago - not adding salt and is using Mrs Sharilyn Sites for seasoning. Reminded to closely follow a 2000mg  sodium diet - saw cardiology ([facility]) 10/03/17 - NS for echo on 03/11/22; this has been r/s for 08/16/21  2: HTN- -  BP looks good (124/83) - BMP 08/24/20 reviewed and showed sodium 140, potassium 5.1, creatinine 1.29 and GFR 71  - follows at [facility] & has appt there next week - our fax number was written on his AVS so that his PCP can send results to [place]; if not, will plan to check them at next visit  3: Tobacco use- - smoking 1/2 ppd cigarettes daily - drinks 2-3 twelve ounce beers a few times/ week - complete cessation discussed for 3 minutes with patient   Medication bottles reviewed.   Return in 1 month, sooner if needed.

## 2023-02-26 ENCOUNTER — Ambulatory Visit: Payer: Medicaid Other | Attending: Family | Admitting: Family

## 2023-02-26 ENCOUNTER — Encounter: Payer: Self-pay | Admitting: Family

## 2023-02-26 VITALS — BP 143/85 | HR 74 | Ht 73.0 in | Wt 160.0 lb

## 2023-02-26 DIAGNOSIS — F17210 Nicotine dependence, cigarettes, uncomplicated: Secondary | ICD-10-CM | POA: Diagnosis not present

## 2023-02-26 DIAGNOSIS — Z21 Asymptomatic human immunodeficiency virus [HIV] infection status: Secondary | ICD-10-CM | POA: Diagnosis not present

## 2023-02-26 DIAGNOSIS — I5032 Chronic diastolic (congestive) heart failure: Secondary | ICD-10-CM | POA: Diagnosis not present

## 2023-02-26 DIAGNOSIS — I110 Hypertensive heart disease with heart failure: Secondary | ICD-10-CM | POA: Insufficient documentation

## 2023-02-26 DIAGNOSIS — Z79899 Other long term (current) drug therapy: Secondary | ICD-10-CM | POA: Diagnosis not present

## 2023-02-26 DIAGNOSIS — Z8249 Family history of ischemic heart disease and other diseases of the circulatory system: Secondary | ICD-10-CM | POA: Diagnosis not present

## 2023-02-26 DIAGNOSIS — Z720 Tobacco use: Secondary | ICD-10-CM | POA: Diagnosis not present

## 2023-02-26 DIAGNOSIS — I10 Essential (primary) hypertension: Secondary | ICD-10-CM | POA: Diagnosis not present

## 2023-02-26 MED ORDER — SACUBITRIL-VALSARTAN 49-51 MG PO TABS: 1.0000 | ORAL_TABLET | Freq: Two times a day (BID) | ORAL | 3 refills | Status: DC

## 2023-02-26 MED ORDER — CARVEDILOL 6.25 MG PO TABS: 6.2500 mg | ORAL_TABLET | Freq: Two times a day (BID) | ORAL | 1 refills | Status: DC

## 2023-02-26 MED ORDER — FUROSEMIDE 20 MG PO TABS: 20.0000 mg | ORAL_TABLET | Freq: Every morning | ORAL | 1 refills | Status: DC

## 2023-02-26 MED ORDER — ATORVASTATIN CALCIUM 40 MG PO TABS: 40.0000 mg | ORAL_TABLET | Freq: Every day | ORAL | 1 refills | Status: DC

## 2023-02-26 NOTE — Patient Instructions (Addendum)
[facility] and get your lab work drawn.   Finish your current entresto by taking 2 tablets in the morning and 2 tablets in the evening. When you get the new bottle, you will resume taking 1 tablet in the morning and 1 tablet in the evening.

## 2023-02-28 NOTE — Addendum Note (Signed)
Addended by: Electa Sniff on: 02/28/2023 02:49 PM   Modules accepted: Orders

## 2023-03-19 ENCOUNTER — Encounter: Payer: Medicaid Other | Admitting: Family

## 2023-03-24 ENCOUNTER — Encounter: Payer: Medicaid Other | Admitting: Family

## 2023-03-24 ENCOUNTER — Telehealth: Payer: Self-pay | Admitting: Family

## 2023-03-24 ENCOUNTER — Ambulatory Visit: Admission: RE | Admit: 2023-03-24 | Payer: No Typology Code available for payment source | Source: Ambulatory Visit

## 2023-03-24 NOTE — Telephone Encounter (Signed)
Patient did not show for his [facility] appointment on 03/24/23 and it doesn't appear that he showed up for his echo earlier today either.
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
## 2023-05-22 ENCOUNTER — Encounter: Payer: Medicaid Other | Admitting: Family

## 2023-05-22 ENCOUNTER — Ambulatory Visit: Admission: RE | Admit: 2023-05-22 | Payer: No Typology Code available for payment source | Source: Ambulatory Visit

## 2023-05-27 ENCOUNTER — Encounter: Payer: Medicaid Other | Admitting: Family

## 2023-06-24 ENCOUNTER — Other Ambulatory Visit (HOSPITAL_COMMUNITY): Payer: Self-pay

## 2023-06-25 ENCOUNTER — Ambulatory Visit: Admission: RE | Admit: 2023-06-25 | Payer: No Typology Code available for payment source | Source: Ambulatory Visit

## 2023-07-22 ENCOUNTER — Ambulatory Visit: Payer: Medicaid Other | Admitting: Urology

## 2023-09-01 ENCOUNTER — Ambulatory Visit: Payer: Medicaid Other | Admitting: Urology

## 2023-09-08 ENCOUNTER — Ambulatory Visit: Payer: Medicaid Other | Admitting: Urology

## 2023-09-19 ENCOUNTER — Other Ambulatory Visit: Payer: Self-pay | Admitting: Family

## 2023-09-26 ENCOUNTER — Ambulatory Visit: Payer: Self-pay | Admitting: Urology

## 2023-10-05 ENCOUNTER — Other Ambulatory Visit: Payer: Self-pay | Admitting: Family

## 2023-10-07 ENCOUNTER — Other Ambulatory Visit: Payer: Self-pay | Admitting: Family

## 2023-10-07 DIAGNOSIS — I5032 Chronic diastolic (congestive) heart failure: Secondary | ICD-10-CM

## 2023-10-08 ENCOUNTER — Other Ambulatory Visit: Payer: Self-pay

## 2023-10-08 DIAGNOSIS — I5032 Chronic diastolic (congestive) heart failure: Secondary | ICD-10-CM

## 2023-10-08 MED ORDER — CARVEDILOL 6.25 MG PO TABS
6.2500 mg | ORAL_TABLET | Freq: Two times a day (BID) | ORAL | 0 refills | Status: DC
Start: 2023-10-08 — End: 2023-11-12

## 2023-10-21 ENCOUNTER — Ambulatory Visit: Admission: RE | Admit: 2023-10-21 | Payer: No Typology Code available for payment source | Source: Ambulatory Visit

## 2023-10-29 ENCOUNTER — Telehealth: Payer: Self-pay | Admitting: Family
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
## 2023-10-29 NOTE — Telephone Encounter (Incomplete)
 Called to confirm/remind patient of their appointment at the [facility] Clinic on 10/30/23***.   Appointment:   [x] Confirmed  [] Left mess   [] No answer/No voice mail  [] Phone not in service  Patient reminded to bring all medications and/or complete list.  Confirmed patient has transportation. Gave directions, instructed to utilize valet parking.

## 2023-10-30 ENCOUNTER — Ambulatory Visit: Payer: Medicaid Other | Attending: Family | Admitting: Family

## 2023-10-30 ENCOUNTER — Encounter: Payer: Self-pay | Admitting: Family

## 2023-10-30 VITALS — BP 133/88 | HR 73 | Wt 163.4 lb

## 2023-10-30 DIAGNOSIS — I5032 Chronic diastolic (congestive) heart failure: Secondary | ICD-10-CM | POA: Diagnosis not present

## 2023-10-30 DIAGNOSIS — Z720 Tobacco use: Secondary | ICD-10-CM | POA: Diagnosis not present

## 2023-10-30 DIAGNOSIS — I2510 Atherosclerotic heart disease of native coronary artery without angina pectoris: Secondary | ICD-10-CM | POA: Diagnosis present

## 2023-10-30 DIAGNOSIS — F17210 Nicotine dependence, cigarettes, uncomplicated: Secondary | ICD-10-CM | POA: Insufficient documentation

## 2023-10-30 DIAGNOSIS — B20 Human immunodeficiency virus [HIV] disease: Secondary | ICD-10-CM | POA: Insufficient documentation

## 2023-10-30 DIAGNOSIS — Z79899 Other long term (current) drug therapy: Secondary | ICD-10-CM | POA: Diagnosis not present

## 2023-10-30 DIAGNOSIS — I10 Essential (primary) hypertension: Secondary | ICD-10-CM | POA: Diagnosis not present

## 2023-10-30 DIAGNOSIS — I252 Old myocardial infarction: Secondary | ICD-10-CM | POA: Insufficient documentation

## 2023-10-30 DIAGNOSIS — E782 Mixed hyperlipidemia: Secondary | ICD-10-CM | POA: Diagnosis not present

## 2023-10-30 DIAGNOSIS — E785 Hyperlipidemia, unspecified: Secondary | ICD-10-CM | POA: Insufficient documentation

## 2023-10-30 DIAGNOSIS — I110 Hypertensive heart disease with heart failure: Secondary | ICD-10-CM | POA: Diagnosis not present

## 2023-10-30 DIAGNOSIS — I272 Pulmonary hypertension, unspecified: Secondary | ICD-10-CM | POA: Insufficient documentation

## 2023-10-30 NOTE — Progress Notes (Signed)
 [facility] - HEART FAILURE CLINIC - PHARMACIST COUNSELING NOTE  Adherence Assessment  Do you ever forget to take your medication?  Entresto needs to be refilled   [] Yes [x] No  Do you ever skip doses due to side effects? [] Yes [x] No  Do you have trouble affording your medicines? Medicaid [] Yes [x] No  Are you ever unable to pick up your medication due to transportation difficulties? [] Yes [x] No  Do you ever stop taking your medications because you don't believe they are helping? [] Yes [x] No  Do you check your weight daily? Every once and a while  [] Yes [x] No  Do you check your blood pressure daily?  [] Yes [x] No  Adherence strategy: Needs a pill box and a bag    Barriers to obtaining medications: None reported     Vital signs: HR ***, BP ***, weight (pounds) *** ECHO: Date ***, EF ***, notes *** Cath: Date ***, EF ***, notes *** Renal function: Date ***, GFR ***  Current Guideline-Directed Medical Therapy/Evidence Based Medicine  ACE/ARB/ARNI: {AR_ARNI/ACEi/ARB:25599} Target dose:  Beta Blocker: {AR_Beta blocker:25598} Target dose:  Aldosterone Antagonist: {AR_Mineralocorticoid receptor antagonists:25602} Target dose:  SGLT2i: {AR_SGLT2i:25603} Target dose:   Diuretic: {AR_Diuretics:25604}  Others:   ASSESSMENT *** year old {Gender Description:210950033} who presents to the HF clinic ***. PMH is significant for ***.   Recent ED visit or hospitalization (past 6 months): Date - ***, CC - *** , Admission Dx (if applicable) - ***    COUNSELING POINTS/CLINICAL PEARLS  Can use the HFCMEDCOUNSELING dot phrase here   PLAN    Time spent: 20 minutes  Littie Deeds, PharmD Pharmacy Resident  10/30/2023 8:57 AM

## 2023-10-30 NOTE — Patient Instructions (Signed)
 Lab Work:  Go DOWN to LOWER LEVEL (LL) to have your blood work completed inside of [employer] office.  We will only call you if the results are abnormal or if the provider would like to make medication changes.   Follow-Up in: 3 months.  At the [facility] Clinic, you and your health needs are our priority. We have a designated team specialized in the treatment of Heart Failure. This Care Team includes your primary Heart Failure Specialized Cardiologist (physician), Advanced Practice Providers (APPs- Physician Assistants and Nurse Practitioners), and Pharmacist who all work together to provide you with the care you need, when you need it.   You may see any of the following providers on your designated Care Team at your next follow up:  Dr. Arvilla Meres Dr. Marca Ancona Dr. Dorthula Nettles Dr. Theresia Bough Clarisa Kindred, FNP Enos Fling, RPH-CPP  Please be sure to bring in all your medications bottles to every appointment.   Need to Contact us:  If you have any questions or concerns before your next appointment please send [place] a message through [place] or call our office at [phone].    TO LEAVE A MESSAGE FOR THE NURSE SELECT OPTION 2, PLEASE LEAVE A MESSAGE INCLUDING: YOUR NAME DATE OF BIRTH CALL BACK NUMBER REASON FOR CALL**this is important as we prioritize the call backs  YOU WILL RECEIVE A CALL BACK THE SAME DAY AS LONG AS YOU CALL BEFORE 4:00 PM

## 2023-11-11 ENCOUNTER — Other Ambulatory Visit: Payer: Self-pay | Admitting: Cardiology

## 2023-11-11 DIAGNOSIS — I5032 Chronic diastolic (congestive) heart failure: Secondary | ICD-10-CM

## 2023-11-12 ENCOUNTER — Other Ambulatory Visit: Payer: Self-pay

## 2023-11-12 DIAGNOSIS — I5032 Chronic diastolic (congestive) heart failure: Secondary | ICD-10-CM

## 2023-11-12 MED ORDER — CARVEDILOL 6.25 MG PO TABS: 6.2500 mg | ORAL_TABLET | Freq: Two times a day (BID) | ORAL | 1 refills | Status: DC

## 2023-11-12 NOTE — Progress Notes (Signed)
 Refill sent per Clarisa Kindred, FNP, at pt's request.

## 2023-11-14 LAB — LIPID PANEL
Chol/HDL Ratio: 1.6 ratio (ref 0.0–5.0)
Cholesterol, Total: 136 mg/dL (ref 100–199)
HDL: 83 mg/dL (ref >39)
LDL Chol Calc (NIH): 43 mg/dL (ref 0–99)
Triglycerides: 38 mg/dL (ref 0–149)
VLDL Cholesterol Cal: 10 mg/dL (ref 5–40)

## 2023-11-14 LAB — BASIC METABOLIC PANEL WITH GFR
BUN/Creatinine Ratio: 11 (ref 9–20)
BUN: 15 mg/dL (ref 6–24)
CO2: 24 mmol/L (ref 20–29)
Calcium: 9.7 mg/dL (ref 8.7–10.2)
Chloride: 102 mmol/L (ref 96–106)
Creatinine, Ser: 1.33 mg/dL — ABNORMAL HIGH (ref 0.76–1.27)
Potassium: 4.7 mmol/L (ref 3.5–5.2)
Sodium: 139 mmol/L (ref 134–144)
eGFR: 62 mL/min/{1.73_m2} (ref >59)

## 2023-12-02 ENCOUNTER — Other Ambulatory Visit: Payer: Self-pay | Admitting: Family

## 2023-12-18 ENCOUNTER — Other Ambulatory Visit: Payer: Self-pay | Admitting: Family

## 2023-12-18 ENCOUNTER — Telehealth: Payer: Self-pay | Admitting: Family

## 2023-12-18 DIAGNOSIS — I5032 Chronic diastolic (congestive) heart failure: Secondary | ICD-10-CM

## 2023-12-19 MED ORDER — CARVEDILOL 6.25 MG PO TABS: 6.2500 mg | ORAL_TABLET | Freq: Two times a day (BID) | ORAL | 5 refills | Status: DC

## 2023-12-19 NOTE — Progress Notes (Signed)
 Called and spoke with pt regarding medication refill to verify pharmacy. Pt requested we send his carvedilol  to [facility] on [address]. Medication sent in to pharmacy.

## 2024-01-21 ENCOUNTER — Other Ambulatory Visit: Payer: Self-pay | Admitting: Family

## 2024-01-23 ENCOUNTER — Other Ambulatory Visit: Payer: Self-pay

## 2024-01-23 MED ORDER — FUROSEMIDE 20 MG PO TABS: 20.0000 mg | ORAL_TABLET | Freq: Every day | ORAL | 0 refills | Status: DC

## 2024-01-23 NOTE — Telephone Encounter (Signed)
 Sent in 1 dose of furosemide  per pt's request. No refills. Made follow up appointment for med titration.

## 2024-01-29 ENCOUNTER — Encounter: Admitting: Family
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
## 2024-02-04 ENCOUNTER — Telehealth: Payer: Self-pay | Admitting: Family

## 2024-02-04 ENCOUNTER — Encounter: Admitting: Family

## 2024-02-04 NOTE — Telephone Encounter (Signed)
 Patient did not show for his Heart Failure Clinic appointment on 02/04/24. No further refills provided until seen.

## 2024-02-27 ENCOUNTER — Other Ambulatory Visit: Payer: Self-pay | Admitting: Family

## 2024-03-10 ENCOUNTER — Telehealth: Payer: Self-pay | Admitting: Family

## 2024-03-10 NOTE — Telephone Encounter (Signed)
 Called to confirm/remind patient of their appointment at the [facility] on 03/11/24.   Appointment:   [] Confirmed  [] Left mess   [] No answer/No voice mail  [x] VM Full/unable to leave message  [] Phone not in service  Patient reminded to bring all medications and/or complete list.  Confirmed patient has transportation. Gave directions, instructed to utilize valet parking.
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
## 2024-03-11 ENCOUNTER — Encounter: Admitting: Family

## 2024-03-16 ENCOUNTER — Emergency Department

## 2024-03-16 ENCOUNTER — Encounter: Payer: Self-pay | Admitting: Emergency Medicine

## 2024-03-16 ENCOUNTER — Observation Stay
Admission: EM | Admit: 2024-03-16 | Discharge: 2024-03-18 | Disposition: A | Discharge status: Home / Self Care | Attending: Student | Admitting: Student

## 2024-03-16 ENCOUNTER — Other Ambulatory Visit: Payer: Self-pay

## 2024-03-16 DIAGNOSIS — I2510 Atherosclerotic heart disease of native coronary artery without angina pectoris: Secondary | ICD-10-CM | POA: Insufficient documentation

## 2024-03-16 DIAGNOSIS — B20 Human immunodeficiency virus [HIV] disease: Secondary | ICD-10-CM | POA: Diagnosis present

## 2024-03-16 DIAGNOSIS — F17210 Nicotine dependence, cigarettes, uncomplicated: Secondary | ICD-10-CM | POA: Diagnosis not present

## 2024-03-16 DIAGNOSIS — R4701 Aphasia: Principal | ICD-10-CM

## 2024-03-16 DIAGNOSIS — F10920 Alcohol use, unspecified with intoxication, uncomplicated: Secondary | ICD-10-CM | POA: Diagnosis not present

## 2024-03-16 DIAGNOSIS — I5020 Unspecified systolic (congestive) heart failure: Secondary | ICD-10-CM | POA: Insufficient documentation

## 2024-03-16 DIAGNOSIS — I509 Heart failure, unspecified: Secondary | ICD-10-CM

## 2024-03-16 DIAGNOSIS — Z21 Asymptomatic human immunodeficiency virus [HIV] infection status: Secondary | ICD-10-CM | POA: Insufficient documentation

## 2024-03-16 DIAGNOSIS — R4182 Altered mental status, unspecified: Secondary | ICD-10-CM

## 2024-03-16 DIAGNOSIS — I110 Hypertensive heart disease with heart failure: Secondary | ICD-10-CM | POA: Diagnosis not present

## 2024-03-16 DIAGNOSIS — R29818 Other symptoms and signs involving the nervous system: Principal | ICD-10-CM

## 2024-03-16 DIAGNOSIS — F12920 Cannabis use, unspecified with intoxication, uncomplicated: Secondary | ICD-10-CM | POA: Insufficient documentation

## 2024-03-16 DIAGNOSIS — I10 Essential (primary) hypertension: Secondary | ICD-10-CM | POA: Diagnosis present

## 2024-03-16 DIAGNOSIS — Z7982 Long term (current) use of aspirin: Secondary | ICD-10-CM | POA: Insufficient documentation

## 2024-03-16 LAB — DIFFERENTIAL
Abs Immature Granulocytes: 0.02 K/uL (ref 0.00–0.07)
Basophils Absolute: 0.1 K/uL (ref 0.0–0.1)
Basophils Relative: 1 %
Eosinophils Absolute: 0 K/uL (ref 0.0–0.5)
Eosinophils Relative: 0 %
Immature Granulocytes: 0 %
Lymphocytes Relative: 14 %
Lymphs Abs: 1.1 K/uL (ref 0.7–4.0)
Monocytes Absolute: 0.5 K/uL (ref 0.1–1.0)
Monocytes Relative: 7 %
Neutro Abs: 6.1 K/uL (ref 1.7–7.7)
Neutrophils Relative %: 78 %

## 2024-03-16 LAB — TROPONIN I (HIGH SENSITIVITY): Troponin I (High Sensitivity): 11 ng/L (ref <18)

## 2024-03-16 LAB — COMPREHENSIVE METABOLIC PANEL WITH GFR
ALT: 18 U/L (ref 0–44)
AST: 43 U/L — ABNORMAL HIGH (ref 15–41)
Albumin: 3.9 g/dL (ref 3.5–5.0)
Alkaline Phosphatase: 53 U/L (ref 38–126)
Anion gap: 10 (ref 5–15)
BUN: 13 mg/dL (ref 6–20)
CO2: 27 mmol/L (ref 22–32)
Calcium: 9.2 mg/dL (ref 8.9–10.3)
Chloride: 104 mmol/L (ref 98–111)
Creatinine, Ser: 1.21 mg/dL (ref 0.61–1.24)
GFR, Estimated: >60 mL/min (ref >60)
Glucose, Bld: 148 mg/dL — ABNORMAL HIGH (ref 70–99)
Potassium: 3.3 mmol/L — ABNORMAL LOW (ref 3.5–5.1)
Sodium: 141 mmol/L (ref 135–145)
Total Bilirubin: 0.9 mg/dL (ref 0.0–1.2)
Total Protein: 7.6 g/dL (ref 6.5–8.1)

## 2024-03-16 LAB — APTT: aPTT: 23 s — ABNORMAL LOW (ref 24–36)

## 2024-03-16 LAB — CBC
HCT: 35.8 % — ABNORMAL LOW (ref 39.0–52.0)
Hemoglobin: 11.8 g/dL — ABNORMAL LOW (ref 13.0–17.0)
MCH: 32.9 pg (ref 26.0–34.0)
MCHC: 33 g/dL (ref 30.0–36.0)
MCV: 99.7 fL (ref 80.0–100.0)
Platelets: 366 K/uL (ref 150–400)
RBC: 3.59 MIL/uL — ABNORMAL LOW (ref 4.22–5.81)
RDW: 13.3 % (ref 11.5–15.5)
WBC: 7.8 K/uL (ref 4.0–10.5)
nRBC: 0 % (ref 0.0–0.2)

## 2024-03-16 LAB — ETHANOL: Alcohol, Ethyl (B): <15 mg/dL (ref <15)

## 2024-03-16 LAB — PROTIME-INR
INR: 1 (ref 0.8–1.2)
Prothrombin Time: 14.1 s (ref 11.4–15.2)

## 2024-03-16 MED ORDER — FUROSEMIDE 20 MG PO TABS
20.0000 mg | ORAL_TABLET | Freq: Every day | ORAL | Status: DC
  Administered 2024-03-17 – 2024-03-18 (×3): 20 mg via ORAL
  Filled 2024-03-16 (×2): qty 1

## 2024-03-16 MED ORDER — SODIUM CHLORIDE 0.9 % IV SOLN: INTRAVENOUS | Status: DC

## 2024-03-16 MED ORDER — ORAL CARE MOUTH RINSE: 15.0000 mL | OROMUCOSAL | Status: DC | PRN

## 2024-03-16 MED ORDER — ACETAMINOPHEN 650 MG RE SUPP: 650.0000 mg | RECTAL | Status: DC | PRN

## 2024-03-16 MED ORDER — BICTEGRAVIR-EMTRICITAB-TENOFOV 50-200-25 MG PO TABS
1.0000 | ORAL_TABLET | Freq: Every day | ORAL | Status: DC
  Administered 2024-03-17 – 2024-03-18 (×3): 1 via ORAL
  Filled 2024-03-16 (×2): qty 1

## 2024-03-16 MED ORDER — CARVEDILOL 6.25 MG PO TABS
6.2500 mg | ORAL_TABLET | Freq: Two times a day (BID) | ORAL | Status: DC
  Administered 2024-03-17 – 2024-03-18 (×6): 6.25 mg via ORAL
  Filled 2024-03-16 (×4): qty 1

## 2024-03-16 MED ORDER — ASPIRIN 81 MG PO TBEC
81.0000 mg | DELAYED_RELEASE_TABLET | Freq: Every day | ORAL | Status: DC
  Administered 2024-03-17 – 2024-03-18 (×3): 81 mg via ORAL
  Filled 2024-03-16 (×2): qty 1

## 2024-03-16 MED ORDER — ACETAMINOPHEN 325 MG RE SUPP
650.0000 mg | RECTAL | Status: DC | PRN
Start: 2024-03-16 — End: 2024-03-17

## 2024-03-16 MED ORDER — ATORVASTATIN CALCIUM 20 MG PO TABS
40.0000 mg | ORAL_TABLET | Freq: Every day | ORAL | Status: DC
  Administered 2024-03-17 – 2024-03-18 (×3): 40 mg via ORAL
  Filled 2024-03-16 (×2): qty 2

## 2024-03-16 MED ORDER — ONDANSETRON HCL 4 MG/2ML IJ SOLN: 4.0000 mg | Freq: Four times a day (QID) | INTRAMUSCULAR | Status: DC | PRN

## 2024-03-16 MED ORDER — ACETAMINOPHEN 325 MG PO TABS: 650.0000 mg | ORAL_TABLET | ORAL | Status: DC | PRN

## 2024-03-16 MED ORDER — ACETAMINOPHEN 325 MG PO TABS
650.0000 mg | ORAL_TABLET | ORAL | Status: DC | PRN
Start: 2024-03-16 — End: 2024-03-17

## 2024-03-16 MED ORDER — NITROGLYCERIN 0.4 MG SL SUBL: 0.4000 mg | SUBLINGUAL_TABLET | SUBLINGUAL | Status: DC | PRN

## 2024-03-16 MED ORDER — ONDANSETRON HCL 4 MG PO TABS: 4.0000 mg | ORAL_TABLET | Freq: Four times a day (QID) | ORAL | Status: DC | PRN

## 2024-03-16 MED ORDER — ORAL CARE MOUTH RINSE
15.0000 mL | OROMUCOSAL | Status: DC
  Administered 2024-03-17 – 2024-03-18 (×6): 15 mL via OROMUCOSAL
  Filled 2024-03-16 (×15): qty 15

## 2024-03-16 MED ORDER — ENOXAPARIN SODIUM 40 MG/0.4ML IJ SOSY
40.0000 mg | PREFILLED_SYRINGE | INTRAMUSCULAR | Status: DC
  Administered 2024-03-17 – 2024-03-18 (×3): 40 mg via SUBCUTANEOUS
  Filled 2024-03-16 (×2): qty 0.4

## 2024-03-16 MED ORDER — ACETAMINOPHEN 160 MG/5ML PO SOLN: 650.0000 mg | ORAL | Status: DC | PRN

## 2024-03-16 MED ORDER — STROKE: EARLY STAGES OF RECOVERY BOOK: Freq: Once | Status: DC

## 2024-03-16 MED ORDER — SODIUM CHLORIDE 0.9 % IV SOLN: 75.0000 mL/h | INTRAVENOUS | Status: DC

## 2024-03-16 NOTE — Assessment & Plan Note (Signed)
 No complaints of chest pain, troponin normal and EKG nonacute Continue atorvastatin  and aspirin 

## 2024-03-16 NOTE — ED Provider Notes (Signed)
 Manuel Rosales Provider Note    Event Date/Time   First MD Initiated Contact with Patient 03/16/24 2155     (approximate)   History   Altered Mental Status   HPI  Manuel Rosales is a 58 y.o. male with history of CHF, CAD, HIV, hypertension, presenting with altered mental status.  Patient states that he has some nausea earlier but no vomiting or diarrhea, he denies any headache or vision changes, no weakness or numbness.  No recent trauma or falls.  No urinary symptoms or cough.  States no prior history of stroke.  No history of seizure.  Patient states that he has been compliant with all his medications, goes to the HIV clinic regularly, last got his labs done 2 months ago and states that they were good.  Patient states that he gets seen at [place].  Per independent history from EMS, last known well by family was yesterday evening.  Unsure what time.  States that today, he has been intermittently confused, intermittently stops talking.  When they went to assess him, he was neuro intact without focal deficits.  Also on independent chart review, his last CD4 count was 100 in 2018.     Physical Exam   Triage Vital Signs: ED Triage Vitals  Encounter Vitals Group     BP --      Girls Systolic BP Percentile --      Girls Diastolic BP Percentile --      Boys Systolic BP Percentile --      Boys Diastolic BP Percentile --      Pulse --      Resp --      Temp --      Temp src --      SpO2 03/16/24 2151 99 %     Weight 03/16/24 2153 163 lb 5.8 oz (74.1 kg)     Height 03/16/24 2153 6' 1 (1.854 m)     Head Circumference --      Peak Flow --      Pain Score 03/16/24 2153 0     Pain Loc --      Pain Education --      Exclude from Growth Chart --     Most recent vital signs: Vitals:   03/16/24 2157 03/16/24 2320  BP: (!) 147/79 (!) 129/91  Pulse: 81 62  Resp: (!) 21 14  Temp: 98.6 F (37 C)   SpO2: 98% 100%     General: Awake, no distress.  He is  ANO x 4 CV:  Good peripheral perfusion.  Resp:  Normal effort.  No tachypnea or respiratory distress Abd:  No distention.  Soft nontender Other:  Pupils are equal and reactive, extraocular movements are intact, no focal weakness or numbness, no cranial nerve deficits.  He is answering questions appropriately without obvious altered mental status.  No nuchal rigidity.   ED Results / Procedures / Treatments   Labs (all labs ordered are listed, but only abnormal results are displayed) Labs Reviewed  APTT - Abnormal; Notable for the following components:      Result Value   aPTT 23 (*)    All other components within normal limits  CBC - Abnormal; Notable for the following components:   RBC 3.59 (*)    Hemoglobin 11.8 (*)    HCT 35.8 (*)    All other components within normal limits  COMPREHENSIVE METABOLIC PANEL WITH GFR - Abnormal; Notable for the following  components:   Potassium 3.3 (*)    Glucose, Bld 148 (*)    AST 43 (*)    All other components within normal limits  ETHANOL  PROTIME-INR  DIFFERENTIAL  URINE DRUG SCREEN, QUALITATIVE ([facility] ONLY)  URINALYSIS, W/ REFLEX TO CULTURE (INFECTION SUSPECTED)  T-HELPER CELLS CD4/CD8 %  TROPONIN I (HIGH SENSITIVITY)  TROPONIN I (HIGH SENSITIVITY)     EKG  EKG shows, sinus rhythm, rate 84, normal QRS, normal QTc, no obvious ischemic ST elevation, T wave flattening in aVL, T wave flattening is not new compared to prior   RADIOLOGY On my independent interpretation, CT head without obvious intracranial hemorrhage   PROCEDURES:  Critical Care performed: No  Procedures   MEDICATIONS ORDERED IN ED: Medications - No data to display   IMPRESSION / MDM / ASSESSMENT AND PLAN / ED COURSE  I reviewed the triage vital signs and the nursing notes.                              Differential diagnosis includes, but is not limited to, seizure, CVA, TIA, mass, no nuchal rigidity or fever to suggest meningitis.  He reports no new trauma  or falls, not on any blood thinners, considered but doubt intracranial hemorrhage.  Stroke alert was not activated since he has no focal deficits at this time.  Last normal was yesterday evening.  Will get labs, EKG, troponin, UA, CT head.  He will need to be admitted for further management, MRI, neurology evaluation.  Patient's presentation is most consistent with acute presentation with potential threat to life or bodily function.  Independent interpretation of labs and imaging below.  Patient was witnessed when he arrived having 2 brief aphasic spells that last for a couple seconds will spontaneously resolve.  It does appear that when this happens he is not as responsive.  Staring straight ahead without gaze deviation, no tonic-clonic activity.  No postictal state, he is back to his mental baseline after. He is back to his mental baseline at this time.  He will need to be admitted for further management, MRI and neurology evaluation.  Consult to hospitalist was agreeable with the plan for admission and will evaluate the patient.  He is admitted.  The patient is on the cardiac monitor to evaluate for evidence of arrhythmia and/or significant heart rate changes.   Clinical Course as of 03/16/24 2332  Tue Mar 16, 2024  2249 Independent review of labs, electrolytes not severely deranged, AST is mildly elevated, rest of LFTs are normal, ethanol levels and troponin are not elevated.  No leukocytosis. [TT]  2328 CT HEAD WO CONTRAST Chronic changes without acute abnormality.  [TT]    Clinical Course User Index [TT] Waymond, Lorelle Cummins, MD     FINAL CLINICAL IMPRESSION(S) / ED DIAGNOSES   Final diagnoses:  Aphasia  Altered mental status, unspecified altered mental status type     Rx / DC Orders   ED Discharge Orders     None        Note:  This document was prepared using Dragon voice recognition software and may include unintentional dictation errors.    Waymond Lorelle Cummins, MD 03/16/24  [phone]

## 2024-03-16 NOTE — Assessment & Plan Note (Signed)
 Clinically euvolemic Holding carvedilol , Entresto  for permissive hypertension

## 2024-03-16 NOTE — ED Triage Notes (Signed)
 Pt arrived via [facility] from home where family reported since this AM, pt has had intermittent episodes of slow response and unbalanced gait. Pt arrived to ED with similar presentation where he would be A&O x4 than stop and stare off and then start taking/responding after 10-15 seconds. Hx/o HIV, CHF, DM.  EDP in room on arrival for further evaluation.

## 2024-03-16 NOTE — Assessment & Plan Note (Signed)
 Follow-up CD4 Continue Biktarvy 

## 2024-03-16 NOTE — Assessment & Plan Note (Signed)
 Holding meds for permissive hypertension

## 2024-03-16 NOTE — Assessment & Plan Note (Addendum)
 TIA vs Seizure versus Encephalopathy Permissive hypertension for first 24-48 hrs post stroke onset: Prn Labetalol IV or Vasotec IV If BP greater than 220/120  Statins for LDL goal less than 70 ASA 81mg  daily, Plavix 75mg  daily x 3 weeks then monotherapy thereafter Telemetry, Echo, MRI and EEG Avoid dextrose  containing fluids, Maintain euglycemia, euthermia Neuro checks q4 hrs x 24 hrs and then per shift Seizure, fall and aspiration precautions Head of bed 30 degrees Physical therapy/Occupational therapy/Speech therapy if failed dysphagia screen Neurology consult to follow

## 2024-03-17 ENCOUNTER — Observation Stay

## 2024-03-17 ENCOUNTER — Observation Stay (HOSPITAL_BASED_OUTPATIENT_CLINIC_OR_DEPARTMENT_OTHER)
Admit: 2024-03-17 | Discharge: 2024-03-17 | Disposition: A | Discharge status: Home / Self Care | Attending: Internal Medicine | Admitting: Internal Medicine

## 2024-03-17 DIAGNOSIS — G4089 Other seizures: Secondary | ICD-10-CM

## 2024-03-17 DIAGNOSIS — I2510 Atherosclerotic heart disease of native coronary artery without angina pectoris: Secondary | ICD-10-CM | POA: Diagnosis not present

## 2024-03-17 DIAGNOSIS — R29818 Other symptoms and signs involving the nervous system: Principal | ICD-10-CM

## 2024-03-17 LAB — ECHOCARDIOGRAM COMPLETE BUBBLE STUDY
Area-P 1/2: 3.99 cm2
S' Lateral: 3.4 cm

## 2024-03-17 LAB — LIPID PANEL
Cholesterol: 120 mg/dL (ref 0–200)
HDL: 59 mg/dL (ref >40)
LDL Cholesterol: 40 mg/dL (ref 0–99)
Total CHOL/HDL Ratio: 2 ratio
Triglycerides: 107 mg/dL (ref <150)
VLDL: 21 mg/dL (ref 0–40)

## 2024-03-17 LAB — TROPONIN I (HIGH SENSITIVITY): Troponin I (High Sensitivity): 8 ng/L (ref <18)

## 2024-03-17 LAB — HEMOGLOBIN A1C
Hgb A1c MFr Bld: 5.1 % (ref 4.8–5.6)
Mean Plasma Glucose: 100 mg/dL

## 2024-03-17 MED ORDER — IOHEXOL 350 MG/ML SOLN
75.0000 mL | Freq: Once | INTRAVENOUS | Status: AC | PRN
  Administered 2024-03-17 (×2): 75 mL via INTRAVENOUS

## 2024-03-17 MED ORDER — GADOBUTROL 1 MMOL/ML IV SOLN
7.0000 mL | Freq: Once | INTRAVENOUS | Status: AC | PRN
  Administered 2024-03-17 (×2): 7 mL via INTRAVENOUS

## 2024-03-17 NOTE — Evaluation (Signed)
 Physical Therapy Evaluation Patient Details Name: Manuel Rosales MRN: [id number] DOB: [birth year] Today's Date: 03/17/2024  History of Present Illness  Manuel Rosales is a 58 y.o. male with medical history significant for HIV, CAD, CHF, HTN being admitted with concerns for TIA versus seizure.  Last known well was in the afternoon of 03/15/2024.  Family reports that during the course of the day he has had intermittent spells of word finding difficulties, unbalanced gait, and slow response.  While in the ED he had several episodes of word finding difficulty associated with staring lasting just a few seconds and then will be back to baseline. MRI negative for acute problem. Patient scheduled to have EEG.  Clinical Impression  Patient received in bed, he is agreeable to PT assessment. Patient is independent with bed mobility, donned socks, transfers with supervision and ambulated ~200 feet. Ambulated into bathroom and performed toileting independently. While walking patient suddenly had difficulty verbalizing. Continued walking back to room and sat down on bed. Was unable to speak for about 3 minutes then it spontaneously resolved. Patient does not have any skilled PT needs at this time. Independent with mobility. Signing off.            If plan is discharge home, recommend the following:  N/a   Can travel by private vehicle    yes    Equipment Recommendations None recommended by PT  Recommendations for Other Services       Functional Status Assessment Patient has not had a recent decline in their functional status     Precautions / Restrictions Precautions Precautions: Other (comment) Precaution/Restrictions Comments: seizures Restrictions Weight Bearing Restrictions Per Provider Order: No      Mobility  Bed Mobility Overal bed mobility: Independent                  Transfers Overall transfer level: Independent                       Ambulation/Gait Ambulation/Gait assistance: Supervision Gait Distance (Feet): 200 Feet Assistive device: None Gait Pattern/deviations: WFL(Within Functional Limits) Gait velocity: WNL     General Gait Details: while ambulating, patient had episode of aphasia. Lasted about 3 minutes. Prior to that he was talking fine. Then could not verbalize much at all for those 3 minutes. Once passed, he verbalized normally again. No difficulties with mobility.  Stairs            Wheelchair Mobility     Tilt Bed    Modified Rankin (Stroke Patients Only)       Balance Overall balance assessment: Independent                                           Pertinent Vitals/Pain Pain Assessment Pain Assessment: No/denies pain    Home Living Family/patient expects to be discharged to:: Private residence Living Arrangements: Other relatives Available Help at Discharge: Family;Available 24 hours/day Type of Home: House Home Access: Level entry       Home Layout: One level Home Equipment: None      Prior Function Prior Level of Function : Independent/Modified Independent;Driving             Mobility Comments: independent at baseline ADLs Comments: independent     Extremity/Trunk Assessment   Upper Extremity Assessment Upper Extremity Assessment: Overall WFL for tasks assessed  Lower Extremity Assessment Lower Extremity Assessment: Overall WFL for tasks assessed    Cervical / Trunk Assessment Cervical / Trunk Assessment: Normal  Communication   Communication Communication: Impaired Factors Affecting Communication: Difficulty expressing self;Reduced clarity of speech    Cognition Arousal: Alert Behavior During Therapy: WFL for tasks assessed/performed   PT - Cognitive impairments: No apparent impairments                         Following commands: Intact       Cueing Cueing Techniques: Verbal cues     General Comments       Exercises     Assessment/Plan    PT Assessment Patient does not need any further PT services  PT Problem List         PT Treatment Interventions      PT Goals (Current goals can be found in the Care Plan section)  Acute Rehab PT Goals Patient Stated Goal: return home PT Goal Formulation: With patient Time For Goal Achievement: 03/24/24 Potential to Achieve Goals: Good    Frequency       Co-evaluation               AM-PAC PT 6 Clicks Mobility  Outcome Measure Help needed turning from your back to your side while in a flat bed without using bedrails?: None Help needed moving from lying on your back to sitting on the side of a flat bed without using bedrails?: None Help needed moving to and from a bed to a chair (including a wheelchair)?: None Help needed standing up from a chair using your arms (e.g., wheelchair or bedside chair)?: None Help needed to walk in hospital room?: None Help needed climbing 3-5 steps with a railing? : None 6 Click Score: 24    End of Session   Activity Tolerance: Patient tolerated treatment well Patient left: in bed;with call bell/phone within reach;with bed alarm set Nurse Communication: Other (comment);Mobility status (episode of being unable to verbalize)      Time: 9062-9048 PT Time Calculation (min) (ACUTE ONLY): 14 min   Charges:   PT Evaluation $PT Eval Low Complexity: 1 Low PT Treatments $Gait Training: 8-22 mins PT General Charges $$ ACUTE PT VISIT: 1 Visit        Ouida Abeyta, PT, GCS 03/17/24,11:16 AM

## 2024-03-17 NOTE — Progress Notes (Signed)
  Echocardiogram 2D Echocardiogram has been performed.  Manuel Rosales 03/17/2024, 1:41 PM

## 2024-03-17 NOTE — Progress Notes (Signed)
 Patient arrieved from ED at 2130. Denies pain or discomfort at this time. Patient has tremors to arms and legs. Call light and personal items within reach

## 2024-03-17 NOTE — H&P (Signed)
 History and Physical    Patient: Manuel Rosales FMW:[id number] DOB: 02/17/1966 DOA: 03/16/2024 DOS: the patient was seen and examined on 03/17/2024 PCP: [facility]  Patient coming from: Home  Chief Complaint:  Chief Complaint  Patient presents with   Altered Mental Status    HPI: Manuel Rosales is a 58 y.o. male with medical history significant for HIV, CAD, CHF, HTN being admitted with concerns for TIA versus seizure.  Last known well was in the afternoon of 03/15/2024.  Family reports that during the course of the day he has had intermittent spells of word finding difficulties, unbalanced gait, and slow response.  While in the ED he had several episodes of word finding difficulty associated with staring lasting just a few seconds and then will be back to baseline.   In the ED, vitals within normal limits.  Labs (CBC, CMP, INR, troponin, EtOH) all unremarkable except for mild anemia of 11.8 and mild hypokalemia of 3.3. UDS pending.  CD4/CD8 pending. EKG with sinus rhythm at 84 chest x-ray nonacute Head CT with chronic changes without acute abnormality MRI brain with and without contrast pending No treatment administered in the ED Admission requested.     Past Medical History:  Diagnosis Date   Chronic systolic CHF (congestive heart failure) (HCC) 06/2017   a. TTE 11/18: EF of 20-25% , difuse hypokinesis, mild to moderate tricuspid regurgitation, and mild pulmonary hypertension   Coronary artery disease 06/2017   Non-ST elevation myocardial infarction.  Treated medically with no cardiac cath done due to active infection and renal failure.   Dermatitis    HIV (human immunodeficiency virus infection) (HCC)    Hypertension    Pulmonary hypertension (HCC)    a. TTE 11/18: EF of 20-25% , difuse hypokinesis, mild to moderate tricuspid regurgitation, and mild pulmonary hypertension w/ PASP 40 mmHg   Past Surgical History:  Procedure Laterality Date   APPENDECTOMY      IR RADIOLOGIST EVAL & MGMT  06/19/2017   Social History:  reports that he has been smoking cigarettes. He has never used smokeless tobacco. He reports current alcohol use of about 2.0 standard drinks of alcohol per week. He reports that he does not currently use drugs after having used the following drugs: Marijuana.  No Known Allergies  Family History  Problem Relation Age of Onset   Cerebral aneurysm Brother        died   Hypertension Mother    Diabetes Mother    Heart attack Father     Prior to Admission medications   Medication Sig Start Date End Date Taking? Authorizing Provider  aspirin  81 MG EC tablet Take 1 tablet (81 mg total) by mouth daily. 06/24/18   Donette Ellouise LABOR, FNP  atorvastatin  (LIPITOR) 40 MG tablet Take 1 tablet by mouth once daily 12/03/23   Donette Ellouise LABOR, FNP  bictegravir-emtricitabine -tenofovir  AF (BIKTARVY ) 50-200-25 MG TABS tablet Take 1 tablet by mouth daily.    [provider]  carvedilol  (COREG ) 6.25 MG tablet Take 1 tablet (6.25 mg total) by mouth 2 (two) times daily. Keep appt with [facility] for refills. 12/19/23   Donette Ellouise LABOR, FNP  cetirizine (ZYRTEC) 10 MG tablet Take 10 mg by mouth daily. PRN    [provider]  cyclobenzaprine  (FLEXERIL ) 5 MG tablet Take 1 tablet (5 mg total) by mouth 3 (three) times daily as needed. 07/31/22   Menshew, Candida LULLA Kings, PA-C  furosemide  (LASIX ) 20 MG tablet Take  1 tablet by mouth once daily 02/27/24   Hackney, Tina A, FNP  glipiZIDE (GLUCOTROL) 5 MG tablet Take 5 mg by mouth daily before breakfast.    [provider]  ibuprofen  (ADVIL ) 800 MG tablet Take 1 tablet (800 mg total) by mouth every 8 (eight) hours as needed. 07/31/22   Menshew, Candida LULLA Kings, PA-C  metFORMIN (GLUCOPHAGE) 1000 MG tablet Take 1,000 mg by mouth 2 (two) times daily with a meal.    [provider]  nitroGLYCERIN  (NITROSTAT ) 0.4 MG SL tablet Place 1 tablet (0.4 mg total) under the tongue every 5 (five)  minutes as needed for chest pain. 06/24/18   Donette Ellouise LABOR, FNP  sacubitril -valsartan  (ENTRESTO ) 49-51 MG Take 1 tablet by mouth 2 (two) times daily. 02/26/23   Donette Ellouise LABOR, FNP    Physical Exam: Vitals:   03/16/24 2151 03/16/24 2153 03/16/24 2157 03/16/24 2320  BP:   (!) 147/79 (!) 129/91  Pulse:   81 62  Resp:   (!) 21 14  Temp:   98.6 F (37 C)   TempSrc:   Oral   SpO2: 99%  98% 100%  Weight:  74.1 kg    Height:  6' 1 (1.854 m)     Physical Exam Vitals and nursing note reviewed.  Constitutional:      General: He is not in acute distress. HENT:     Head: Normocephalic and atraumatic.  Cardiovascular:     Rate and Rhythm: Normal rate and regular rhythm.     Heart sounds: Normal heart sounds.  Pulmonary:     Effort: Pulmonary effort is normal.     Breath sounds: Normal breath sounds.  Abdominal:     Palpations: Abdomen is soft.     Tenderness: There is no abdominal tenderness.  Neurological:     General: No focal deficit present.     Mental Status: Mental status is at baseline.     Labs on Admission: I have personally reviewed following labs and imaging studies  CBC: Recent Labs  Lab 03/16/24 2200  WBC 7.8  NEUTROABS 6.1  HGB 11.8*  HCT 35.8*  MCV 99.7  PLT 366   Basic Metabolic Panel: Recent Labs  Lab 03/16/24 2200  NA 141  K 3.3*  CL 104  CO2 27  GLUCOSE 148*  BUN 13  CREATININE 1.21  CALCIUM  9.2   GFR: Estimated Creatinine Clearance: 68.9 mL/min (by C-G formula based on SCr of 1.21 mg/dL). Liver Function Tests: Recent Labs  Lab 03/16/24 2200  AST 43*  ALT 18  ALKPHOS 53  BILITOT 0.9  PROT 7.6  ALBUMIN 3.9   No results for input(s): LIPASE, AMYLASE in the last 168 hours. No results for input(s): AMMONIA in the last 168 hours. Coagulation Profile: Recent Labs  Lab 03/16/24 2200  INR 1.0   Cardiac Enzymes: No results for input(s): CKTOTAL, CKMB, CKMBINDEX, TROPONINI in the last 168 hours. BNP (last 3  results) No results for input(s): PROBNP in the last 8760 hours. HbA1C: No results for input(s): HGBA1C in the last 72 hours. CBG: No results for input(s): GLUCAP in the last 168 hours. Lipid Profile: No results for input(s): CHOL, HDL, LDLCALC, TRIG, CHOLHDL, LDLDIRECT in the last 72 hours. Thyroid Function Tests: No results for input(s): TSH, T4TOTAL, FREET4, T3FREE, THYROIDAB in the last 72 hours. Anemia Panel: No results for input(s): VITAMINB12, FOLATE, FERRITIN, TIBC, IRON, RETICCTPCT in the last 72 hours. Urine analysis:    Component Value Date/Time  COLORURINE STRAW (A) 06/08/2017 1935   APPEARANCEUR CLEAR (A) 06/08/2017 1935   APPEARANCEUR Hazy 07/19/2013 2213   LABSPEC 1.016 06/08/2017 1935   LABSPEC 1.035 07/19/2013 2213   PHURINE 5.0 06/08/2017 1935   GLUCOSEU NEGATIVE 06/08/2017 1935   GLUCOSEU Negative 07/19/2013 2213   HGBUR NEGATIVE 06/08/2017 1935   BILIRUBINUR NEGATIVE 06/08/2017 1935   BILIRUBINUR Negative 07/19/2013 2213   KETONESUR NEGATIVE 06/08/2017 1935   PROTEINUR NEGATIVE 06/08/2017 1935   NITRITE NEGATIVE 06/08/2017 1935   LEUKOCYTESUR NEGATIVE 06/08/2017 1935   LEUKOCYTESUR Negative 07/19/2013 2213    Radiological Exams on Admission: DG Chest 1 View Result Date: 03/16/2024 CLINICAL DATA:  Altered mental status with unbalanced gait. EXAM: CHEST  1 VIEW COMPARISON:  June 29, 2017 FINDINGS: The heart size and mediastinal contours are within normal limits. Both lungs are clear. The visualized skeletal structures are unremarkable. IMPRESSION: No active disease. Electronically Signed   By: Suzen Dials M.D.   On: 03/16/2024 23:45   CT HEAD WO CONTRAST Result Date: 03/16/2024 CLINICAL DATA:  Intermittent aphasia EXAM: CT HEAD WITHOUT CONTRAST TECHNIQUE: Contiguous axial images were obtained from the base of the skull through the vertex without intravenous contrast. RADIATION DOSE REDUCTION: This exam was  performed according to the departmental dose-optimization program which includes automated exposure control, adjustment of the mA and/or kV according to patient size and/or use of iterative reconstruction technique. COMPARISON:  None Available. FINDINGS: Brain: No evidence of acute infarction, hemorrhage, hydrocephalus, extra-axial collection or mass lesion/mass effect. Mild atrophic changes are noted. Vascular: No hyperdense vessel or unexpected calcification. Skull: Normal. Negative for fracture or focal lesion. Sinuses/Orbits: Opacification of the left frontal sinus is noted. Other: None. IMPRESSION: Chronic changes without acute abnormality. Electronically Signed   By: Oneil Devonshire M.D.   On: 03/16/2024 23:19   Data Reviewed for HPI: Relevant notes from primary care and specialist visits, past discharge summaries as available in EHR, including Care Everywhere. Prior diagnostic testing as pertinent to current admission diagnoses Updated medications and problem lists for reconciliation ED course, including vitals, labs, imaging, treatment and response to treatment Triage notes, nursing and pharmacy notes and ED provider's notes Notable results as noted above in HPI      Assessment and Plan: * Focal neurological deficit TIA vs Seizure versus Encephalopathy Permissive hypertension for first 24-48 hrs post stroke onset: Prn Labetalol IV or Vasotec IV If BP greater than 220/120  Statins for LDL goal less than 70 ASA 81mg  daily, Plavix 75mg  daily x 3 weeks then monotherapy thereafter Telemetry, Echo, MRI and EEG Avoid dextrose  containing fluids, Maintain euglycemia, euthermia Neuro checks q4 hrs x 24 hrs and then per shift Seizure, fall and aspiration precautions Head of bed 30 degrees Physical therapy/Occupational therapy/Speech therapy if failed dysphagia screen Neurology consult to follow   Coronary artery disease No complaints of chest pain, troponin normal and EKG nonacute Continue  atorvastatin  and aspirin   HTN (hypertension) Holding meds for permissive hypertension  HIV disease (HCC) Follow-up CD4 Continue Biktarvy   CHF (congestive heart failure) (HCC) Clinically euvolemic Holding carvedilol , Entresto  for permissive hypertension        DVT prophylaxis: Lovenox   Consults: none  Advance Care Planning:   Code Status: Full Code   Family Communication: none  Disposition Plan: Back to previous home environment  Severity of Illness: The appropriate patient status for this patient is OBSERVATION. Observation status is judged to be reasonable and necessary in order to provide the required intensity of service to ensure the  patient's safety. The patient's presenting symptoms, physical exam findings, and initial radiographic and laboratory data in the context of their medical condition is felt to place them at decreased risk for further clinical deterioration. Furthermore, it is anticipated that the patient will be medically stable for discharge from the hospital within 2 midnights of admission.   Author: Delayne LULLA Solian, MD 03/17/2024 12:00 AM  For on call review [email or web address].

## 2024-03-17 NOTE — ED Notes (Signed)
Pt in mri 

## 2024-03-17 NOTE — Procedures (Signed)
 History: 58 year old with intermittent episodes of aphemia, evaluate for seizures  EEG Duration: 23 minutes  Sedation: None  Patient State: Awake and drowsy  Technique: This EEG was acquired with electrodes placed according to the International 10-20 electrode system (including Fp1, Fp2, F3, F4, C3, C4, P3, P4, O1, O2, T3, T4, T5, T6, A1, A2, Fz, Cz, Pz). The following electrodes were missing or displaced: none.   Background: The background consists of intermixed alpha and beta activities. There is a well defined posterior dominant rhythm of 9-10 Hz that attenuates with eye opening.  During the recording he had a single episode characterized by psychomotor agitation and refusal to speak.  During the episode, there is no significant change to the underlying rhythms until the end at which point there is a suggestion of a left-sided rhythmic activity that appears artifactual in nature without any evolution.  He is moving his arm at the time, and I wonder if this is movement related.  The same pattern is seen subsequently when the patient is asymptomatic as well.  There was no definite epileptiform discharge.  Photic stimulation: Physiologic driving is not performed  EEG Abnormalities: No definite abnormalities  Clinical Interpretation: This normal EEG is recorded in the waking and drowsy state. There was no seizure or seizure predisposition recorded on this study. Please note that lack of epileptiform activity on EEG does not preclude the possibility of epilepsy.   Though there was no definite evidence of epileptic activity, if the patient continues to have episodes, could consider transfer for longer-term monitoring but at the current time I do not think that this study supports an epileptic nature to his events.  Aisha Seals, MD [facility]   If 7pm- 7am, please page neurology on call as listed in AMION.

## 2024-03-17 NOTE — Progress Notes (Signed)
 No family at bedside. Patient seen earlier.  Patient was brought in because of intermittent episodes of word finding difficulty on balance gait and slow response to verbal commands. Patient has been having the symptoms for last couple days. MRI brain negative for acute CVA. Neurology Dr. Michaela aware. Await recommendations. EEG pending head CT with chronic changes without acute abnormality MRI brain with and without contrast no acute intracranial abnormality. 2. Mildly age advanced cerebral white matter signal changes, nonspecific but most commonly due to chronic small vessel disease. 3. 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  Continue home meds including aspirin .  No charge

## 2024-03-17 NOTE — Progress Notes (Signed)
 Assisted patient to restroom as bed alarm was alarming.  Patient noted to have been incontinent of stool.  Linens changed.  Patient ambulated to restroom with steady gait.

## 2024-03-17 NOTE — ED Notes (Signed)
 Met Pt at 15:15 while getting report from prior nurse.  Pt was standing, unhooked from monitor stating he needed to go to the bathroom now.  Stated he could walk.  Patient walked to the bathroom.  I finished getting report on this patient with the concerns that he is having episodes more frequently of not being able to speak.  He understands the words, he is able to walk and he is able to shake his head yes and no to answer the questions, however he is not able to speak the words.    Patient came out of the bathroom and I walked in there and he was not able to speak words again.  He answered by nodding appropriately. I asked him to write his name and he did.  At 1530 he was able to speak again and we had a conversation.   Sending secure message to provider with update to re-evaluate the patient since it is happening faster than before.

## 2024-03-17 NOTE — Evaluation (Signed)
 Occupational Therapy Evaluation Patient Details Name: Manuel Rosales MRN: [id number] DOB: Jan 02, 1966 Today's Date: 03/17/2024   History of Present Illness   Manuel Rosales is a 58 y.o. male with medical history significant for HIV, CAD, CHF, HTN being admitted with concerns for TIA versus seizure.  Last known well was in the afternoon of 03/15/2024.  Family reports that during the course of the day he has had intermittent spells of word finding difficulties, unbalanced gait, and slow response.  While in the ED he had several episodes of word finding difficulty associated with staring lasting just a few seconds and then will be back to baseline. MRI negative for acute problem. Patient scheduled to have EEG.     Clinical Impressions During today's OT evaluation, Manuel Rosales was initially doing well, demonstrating IND in fxl mobility and BADL performance. After he returned to bed, he suddenly became unable to speak; he began to flail his arms, kick his feet, and he had a panicked look on his face and began to cry. Asked nurse to come into the room, and she explained that pt has been having numerous such episodes throughout the day and stated that they were becoming more frequent and lasting longer. The episode lasted ~ 4 minutes, after which pt then began speaking normally and was again alert and oriented x 3. Approximately 5 minutes later, pt then had another short episode, during which he was unable to speak for ~ 2 minutes, but did not have the flailing or panic this time around. RN informed. From a fxl mobility standpoint, pt is IND and at his baseline, so will DC from OT at present. Should his condition change, please re-consult.      If plan is discharge home, recommend the following:         Functional Status Assessment    (intermittent decline in functional status)     Equipment Recommendations         Recommendations for Other Services          Precautions/Restrictions   Precautions Precautions: Other (comment) Precaution/Restrictions Comments: seizures Restrictions Weight Bearing Restrictions Per Provider Order: No     Mobility Bed Mobility Overal bed mobility: Independent                  Transfers Overall transfer level: Independent                        Balance Overall balance assessment: Independent                                         ADL either performed or assessed with clinical judgement   ADL Overall ADL's : Modified independent                                       General ADL Comments: IND in UB and LB dressing, grooming, feeding     Vision Baseline Vision/History: 0 No visual deficits       Perception         Praxis         Pertinent Vitals/Pain Pain Assessment Pain Assessment: No/denies pain     Extremity/Trunk Assessment Upper Extremity Assessment Upper Extremity Assessment: Overall WFL for tasks assessed   Lower Extremity Assessment Lower Extremity  Assessment: Overall WFL for tasks assessed   Cervical / Trunk Assessment Cervical / Trunk Assessment: Normal   Communication Communication Communication: Impaired (has periods of inability to speak clearly) Factors Affecting Communication: Difficulty expressing self;Reduced clarity of speech   Cognition Arousal: Alert Behavior During Therapy: [facility] for tasks assessed/performed                                 Following commands: Intact       Cueing  General Comments          Exercises     Shoulder Instructions      Home Living Family/patient expects to be discharged to:: Private residence Living Arrangements: Other relatives Available Help at Discharge: Family;Available 24 hours/day Type of Home: House Home Access: Level entry     Home Layout: One level               Home Equipment: None   Additional Comments: lives with sister and  brother      Prior Functioning/Environment Prior Level of Function : Independent/Modified Independent;Driving             Mobility Comments: independent at baseline ADLs Comments: independent    OT Problem List: Decreased coordination;Other (comment) (periods of impaired speech, coordination)   OT Treatment/Interventions:        OT Goals(Current goals can be found in the care plan section)   Acute Rehab OT Goals Patient Stated Goal: to know what is happening to me, and why OT Goal Formulation: With patient Time For Goal Achievement: 03/31/24 Potential to Achieve Goals: Good   OT Frequency:       Co-evaluation              AM-PAC OT 6 Clicks Daily Activity     Outcome Measure Help from another person eating meals?: None Help from another person taking care of personal grooming?: None Help from another person toileting, which includes using toliet, bedpan, or urinal?: None Help from another person bathing (including washing, rinsing, drying)?: None Help from another person to put on and taking off regular upper body clothing?: None Help from another person to put on and taking off regular lower body clothing?: None 6 Click Score: 24   End of Session    Activity Tolerance: Patient tolerated treatment well Patient left: in bed;with call bell/phone within reach;with bed alarm set  OT Visit Diagnosis: Other abnormalities of gait and mobility (R26.89);Other symptoms and signs involving cognitive function                Time: 1419-1443 OT Time Calculation (min): 24 min Charges:  OT General Charges $OT Visit: 1 Visit OT Evaluation $OT Eval Low Complexity: 1 Low Suzen Hock, PhD, MS, OTR/L 03/17/24, 3:11 PM

## 2024-03-17 NOTE — ED Notes (Signed)
 This RN entered patient's room and informed him she was placing a BP cuff on his arm, prior to informing him pt was on the phone and speaking normally. For roughly 2 minutes pt was not responsive to verbal or painful stimuli, staring off into space with minimal motor activity. Pupils were fixed and roughly 3 mm. VS unchanged. Patient suddenly blinked and came back. On reassessment--pt is immediately A&O x4 and unaware of altered event, unlabored respirations, no post ictal lethargy apparent.

## 2024-03-17 NOTE — ED Notes (Signed)
Pt moved to room 37  

## 2024-03-17 NOTE — Group Note (Deleted)
 Date:  03/17/2024 Time:  2:21 PM  Group Topic/Focus:  Wellness Toolbox:   The focus of this group is to discuss various aspects of wellness, balancing those aspects and exploring ways to increase the ability to experience wellness.  Patients will create a wellness toolbox for use upon discharge.     Participation Level:  {BHH PARTICIPATION OZCZO:77735}  Participation Quality:  {BHH PARTICIPATION QUALITY:22265}  Affect:  {BHH AFFECT:22266}  Cognitive:  {BHH COGNITIVE:22267}  Insight: {BHH Insight2:20797}  Engagement in Group:  {BHH ENGAGEMENT IN HMNLE:77731}  Modes of Intervention:  {BHH MODES OF INTERVENTION:22269}  Additional Comments:  ***  Myra Curtistine BROCKS 03/17/2024, 2:21 PM

## 2024-03-17 NOTE — Progress Notes (Signed)
 SLP Cancellation Note  Patient Details Name: Manuel Rosales MRN: [id number] DOB: 1965-11-30   Cancelled treatment:       Reason Eval/Treat Not Completed: SLP screened, no needs identified, will sign off (Spoke with Charity fundraiser. Per RN, pt passed Nursing Dysphagia Screening this morning and has NIHSS = 0. No concerns re: speech, language, cognition. SLP to sign off at this time.)  Delon Bangs, M.S., CCC-SLP Speech-Language Pathologist [facility] [phone] [place])  [name] 03/17/2024, 1:13 PM

## 2024-03-17 NOTE — Progress Notes (Signed)
 Eeg done

## 2024-03-17 NOTE — Consult Note (Signed)
 NEUROLOGY CONSULT NOTE   Date of service: March 17, 2024 Patient Name: Manuel Rosales MRN:  [id number] DOB:  1965/08/10 Chief Complaint: speech problems Requesting Provider: Tobie Calix, MD  History of Present Illness  Manuel Rosales is a 58 y.o. male with hx of CHF, hypertension, HIV who presents with recurrent episodes of difficulty speaking.  He states that it comes on for a few minutes at a time, and then goes away for few minutes then comes back.  They initially were further apart, but been getting closer together over the course of the day.  He denies any weakness, numbness, or other changes other than not being able to speak.  He is able to understand, and able to communicate throughout the Means while the episode is ongoing.  He has never had anything like this before.  Past History   Past Medical History:  Diagnosis Date   Chronic systolic CHF (congestive heart failure) (HCC) 06/2017   a. TTE 11/18: EF of 20-25% , difuse hypokinesis, mild to moderate tricuspid regurgitation, and mild pulmonary hypertension   Coronary artery disease 06/2017   Non-ST elevation myocardial infarction.  Treated medically with no cardiac cath done due to active infection and renal failure.   Dermatitis    HIV (human immunodeficiency virus infection) (HCC)    Hypertension    Pulmonary hypertension (HCC)    a. TTE 11/18: EF of 20-25% , difuse hypokinesis, mild to moderate tricuspid regurgitation, and mild pulmonary hypertension w/ PASP 40 mmHg    Past Surgical History:  Procedure Laterality Date   APPENDECTOMY     IR RADIOLOGIST EVAL & MGMT  06/19/2017    Family History: Family History  Problem Relation Age of Onset   Cerebral aneurysm Brother        died   Hypertension Mother    Diabetes Mother    Heart attack Father     Social History  reports that he has been smoking cigarettes. He has never used smokeless tobacco. He reports current alcohol use of about 2.0 standard drinks of  alcohol per week. He reports that he does not currently use drugs after having used the following drugs: Marijuana.  No Known Allergies  Medications   Current Facility-Administered Medications:     stroke: early stages of recovery book, , Does not apply, Once, Manuel Delayne GAILS, MD   acetaminophen  (TYLENOL ) tablet 650 mg, 650 mg, Oral, Q4H PRN **OR** acetaminophen  (TYLENOL ) 160 MG/5ML solution 650 mg, 650 mg, Per Tube, Q4H PRN **OR** acetaminophen  (TYLENOL ) suppository 650 mg, 650 mg, Rectal, Q4H PRN, Manuel Delayne GAILS, MD   aspirin  EC tablet 81 mg, 81 mg, Oral, Daily, Manuel Delayne V, MD, 81 mg at 03/17/24 0855   atorvastatin  (LIPITOR) tablet 40 mg, 40 mg, Oral, Daily, Manuel Delayne V, MD, 40 mg at 03/17/24 9144   bictegravir-emtricitabine -tenofovir  AF (BIKTARVY ) 50-200-25 MG per tablet 1 tablet, 1 tablet, Oral, Daily, Manuel Delayne V, MD, 1 tablet at 03/17/24 1058   carvedilol  (COREG ) tablet 6.25 mg, 6.25 mg, Oral, BID WC, Duncan, Hazel V, MD, 6.25 mg at 03/17/24 0854   enoxaparin  (LOVENOX ) injection 40 mg, 40 mg, Subcutaneous, Q24H, Manuel Delayne V, MD, 40 mg at 03/17/24 9144   furosemide  (LASIX ) tablet 20 mg, 20 mg, Oral, Daily, Manuel Delayne V, MD, 20 mg at 03/17/24 9144   nitroGLYCERIN  (NITROSTAT ) SL tablet 0.4 mg, 0.4 mg, Sublingual, Q5 min PRN, Manuel Delayne GAILS, MD   ondansetron  (ZOFRAN ) tablet 4 mg, 4 mg, Oral, Q6H PRN **  OR** ondansetron  (ZOFRAN ) injection 4 mg, 4 mg, Intravenous, Q6H PRN, Manuel Delayne GAILS, MD   Oral care mouth rinse, 15 mL, Mouth Rinse, Q2H, Manuel Delayne GAILS, MD   Oral care mouth rinse, 15 mL, Mouth Rinse, PRN, Manuel Delayne GAILS, MD  Current Outpatient Medications:    aspirin  81 MG EC tablet, Take 1 tablet (81 mg total) by mouth daily., Disp: 90 tablet, Rfl: 3   atorvastatin  (LIPITOR) 40 MG tablet, Take 1 tablet by mouth once daily, Disp: 90 tablet, Rfl: 1   bictegravir-emtricitabine -tenofovir  AF (BIKTARVY ) 50-200-25 MG TABS tablet, Take 1 tablet by mouth daily., Disp: , Rfl:     carvedilol  (COREG ) 6.25 MG tablet, Take 1 tablet (6.25 mg total) by mouth 2 (two) times daily. [facility] appt with [facility] for refills., Disp: 60 tablet, Rfl: 5   furosemide  (LASIX ) 20 MG tablet, Take 1 tablet by mouth once daily, Disp: 30 tablet, Rfl: 0   glipiZIDE (GLUCOTROL) 5 MG tablet, Take 5 mg by mouth daily before breakfast., Disp: , Rfl:    metFORMIN (GLUCOPHAGE) 1000 MG tablet, Take 1,000 mg by mouth 2 (two) times daily with a meal., Disp: , Rfl:    nitroGLYCERIN  (NITROSTAT ) 0.4 MG SL tablet, Place 1 tablet (0.4 mg total) under the tongue every 5 (five) minutes as needed for chest pain., Disp: 30 tablet, Rfl: 1   sacubitril -valsartan  (ENTRESTO ) 49-51 MG, Take 1 tablet by mouth 2 (two) times daily., Disp: 180 tablet, Rfl: 3   cetirizine (ZYRTEC) 10 MG tablet, Take 10 mg by mouth daily. PRN (Patient not taking: Reported on 03/17/2024), Disp: , Rfl:    cyclobenzaprine  (FLEXERIL ) 5 MG tablet, Take 1 tablet (5 mg total) by mouth 3 (three) times daily as needed. (Patient not taking: Reported on 03/17/2024), Disp: 15 tablet, Rfl: 0   ibuprofen  (ADVIL ) 800 MG tablet, Take 1 tablet (800 mg total) by mouth every 8 (eight) hours as needed. (Patient not taking: Reported on 03/17/2024), Disp: 30 tablet, Rfl: 0  Vitals   Vitals:   03/17/24 0945 03/17/24 0953 03/17/24 1330 03/17/24 1345  BP: (!) 141/103  132/84   Pulse: (!) 118  (!) 59   Resp: (!) 25  13   Temp:  98.1 F (36.7 C)  98.1 F (36.7 C)  TempSrc:  Oral  Oral  SpO2: 96%  100%   Weight:      Height:        Body mass index is 21.55 kg/m.   Physical Exam   Constitutional: Appears well-developed and well-nourished.   Neurologic Examination    Neuro: Mental Status: Patient is awake, alert, oriented to person, place, month, year, and situation. Patient is able to give a clear and coherent history. No signs of aphasia or neglect Cranial Nerves: II: Visual Fields are full. Pupils are equal, round, and reactive to light.    III,IV, VI: EOMI without ptosis or diploplia.  Rosales: Facial sensation is symmetric to temperature VII: Facial movement is symmetric.  VIII: hearing is intact to voice X: Uvula elevates symmetrically XII: tongue is midline without atrophy or fasciculations.  Motor: Tone is normal. Bulk is normal. 5/5 strength was present in all four extremities.  Sensory: Sensation is symmetric to light touch and temperature in the arms and legs. Cerebellar: FNF and HKS are intact bilaterally  I was able to witness one of the episodes of difficulty speaking.  During the episode, he was still able to communicate with pen and paper, but had complete lack of vocalization.  As I continued to ask him questions, he began to be able to make intermittent sounds and then abruptly began speaking fluently once again.       Labs/Imaging/Neurodiagnostic studies   CBC:  Recent Labs  Lab 03-25-2024 2200  WBC 7.8  NEUTROABS 6.1  HGB 11.8*  HCT 35.8*  MCV 99.7  PLT 366   Basic Metabolic Panel:  Lab Results  Component Value Date   NA 141 2024-03-25   K 3.3 (L) 2024-03-25   CO2 27 2024/03/25   GLUCOSE 148 (H) 25-Mar-2024   BUN 13 2024/03/25   CREATININE 1.21 03/25/2024   CALCIUM  9.2 2024-03-25   GFRNONAA >60 2024-03-25   GFRAA 39 (L) 10/03/2017   Lipid Panel:  Lab Results  Component Value Date   LDLCALC 40 03/17/2024   HgbA1c:  Lab Results  Component Value Date   HGBA1C 5.8 (H) 06/09/2017   Urine Drug Screen: No results found for: LABOPIA, COCAINSCRNUR, LABBENZ, AMPHETMU, THCU, LABBARB  Alcohol Level     Component Value Date/Time   ETH <15 2024-03-25 2200   INR  Lab Results  Component Value Date   INR 1.0 March 25, 2024   APTT  Lab Results  Component Value Date   APTT 23 (L) 2024/03/25    MRI Brain(Personally reviewed): Negative for acute finding ASSESSMENT   MAKOTO SELLITTO is a 58 y.o. male with recurrent episodes of aphemia.  Given how many times it is, gone, with  negative MRI I think that the likelihood of this being vascular is very low.  I will order a CTA head and neck, but unless there is a very tight intracranial stenosis, I would not favor a vascular etiology of the spells.  One possibility could be focal seizure and will try to capture a spell.  RECOMMENDATIONS  EEG CTA head and neck Will follow.  ______________________________________________________________________    Bonney Aisha Seals, MD [facility]

## 2024-03-17 NOTE — Hospital Course (Signed)
 SABRA

## 2024-03-17 NOTE — ED Notes (Signed)
 CCMD called to initiate monitoring @ this time

## 2024-03-18 ENCOUNTER — Encounter (HOSPITAL_COMMUNITY): Payer: Self-pay

## 2024-03-18 ENCOUNTER — Encounter (HOSPITAL_COMMUNITY): Payer: Self-pay | Admitting: Student

## 2024-03-18 ENCOUNTER — Observation Stay (HOSPITAL_COMMUNITY)
Admission: RE | Admit: 2024-03-18 | Discharge: 2024-03-21 | Disposition: A | Discharge status: Home / Self Care | Source: Other Acute Inpatient Hospital | Attending: Internal Medicine | Admitting: Internal Medicine

## 2024-03-18 DIAGNOSIS — D649 Anemia, unspecified: Secondary | ICD-10-CM | POA: Insufficient documentation

## 2024-03-18 DIAGNOSIS — F17210 Nicotine dependence, cigarettes, uncomplicated: Secondary | ICD-10-CM | POA: Diagnosis present

## 2024-03-18 DIAGNOSIS — I130 Hypertensive heart and chronic kidney disease with heart failure and stage 1 through stage 4 chronic kidney disease, or unspecified chronic kidney disease: Secondary | ICD-10-CM | POA: Diagnosis present

## 2024-03-18 DIAGNOSIS — E876 Hypokalemia: Secondary | ICD-10-CM | POA: Diagnosis present

## 2024-03-18 DIAGNOSIS — Z833 Family history of diabetes mellitus: Secondary | ICD-10-CM | POA: Diagnosis not present

## 2024-03-18 DIAGNOSIS — Z7401 Bed confinement status: Secondary | ICD-10-CM | POA: Diagnosis not present

## 2024-03-18 DIAGNOSIS — F1010 Alcohol abuse, uncomplicated: Secondary | ICD-10-CM | POA: Insufficient documentation

## 2024-03-18 DIAGNOSIS — Z7982 Long term (current) use of aspirin: Secondary | ICD-10-CM | POA: Diagnosis not present

## 2024-03-18 DIAGNOSIS — I5022 Chronic systolic (congestive) heart failure: Secondary | ICD-10-CM | POA: Diagnosis present

## 2024-03-18 DIAGNOSIS — E1122 Type 2 diabetes mellitus with diabetic chronic kidney disease: Secondary | ICD-10-CM | POA: Diagnosis present

## 2024-03-18 DIAGNOSIS — I10 Essential (primary) hypertension: Secondary | ICD-10-CM | POA: Diagnosis present

## 2024-03-18 DIAGNOSIS — I071 Rheumatic tricuspid insufficiency: Secondary | ICD-10-CM | POA: Diagnosis present

## 2024-03-18 DIAGNOSIS — I2510 Atherosclerotic heart disease of native coronary artery without angina pectoris: Secondary | ICD-10-CM | POA: Diagnosis present

## 2024-03-18 DIAGNOSIS — D631 Anemia in chronic kidney disease: Secondary | ICD-10-CM | POA: Diagnosis present

## 2024-03-18 DIAGNOSIS — I272 Pulmonary hypertension, unspecified: Secondary | ICD-10-CM | POA: Diagnosis present

## 2024-03-18 DIAGNOSIS — Z79899 Other long term (current) drug therapy: Secondary | ICD-10-CM | POA: Diagnosis not present

## 2024-03-18 DIAGNOSIS — Z7984 Long term (current) use of oral hypoglycemic drugs: Secondary | ICD-10-CM | POA: Insufficient documentation

## 2024-03-18 DIAGNOSIS — R4789 Other speech disturbances: Secondary | ICD-10-CM | POA: Diagnosis present

## 2024-03-18 DIAGNOSIS — Z720 Tobacco use: Secondary | ICD-10-CM | POA: Diagnosis present

## 2024-03-18 DIAGNOSIS — B20 Human immunodeficiency virus [HIV] disease: Secondary | ICD-10-CM | POA: Insufficient documentation

## 2024-03-18 DIAGNOSIS — R479 Unspecified speech disturbances: Secondary | ICD-10-CM | POA: Diagnosis present

## 2024-03-18 DIAGNOSIS — R29818 Other symptoms and signs involving the nervous system: Secondary | ICD-10-CM | POA: Diagnosis not present

## 2024-03-18 DIAGNOSIS — Z8249 Family history of ischemic heart disease and other diseases of the circulatory system: Secondary | ICD-10-CM

## 2024-03-18 DIAGNOSIS — R569 Unspecified convulsions: Secondary | ICD-10-CM | POA: Diagnosis present

## 2024-03-18 DIAGNOSIS — N1831 Chronic kidney disease, stage 3a: Secondary | ICD-10-CM | POA: Diagnosis present

## 2024-03-18 DIAGNOSIS — E119 Type 2 diabetes mellitus without complications: Secondary | ICD-10-CM

## 2024-03-18 DIAGNOSIS — I252 Old myocardial infarction: Secondary | ICD-10-CM | POA: Diagnosis not present

## 2024-03-18 DIAGNOSIS — Z21 Asymptomatic human immunodeficiency virus [HIV] infection status: Secondary | ICD-10-CM | POA: Diagnosis present

## 2024-03-18 DIAGNOSIS — F4320 Adjustment disorder, unspecified: Secondary | ICD-10-CM | POA: Diagnosis not present

## 2024-03-18 LAB — T-HELPER CELLS CD4/CD8 %
% CD 4 Pos. Lymph.: 32.1 % (ref 30.8–58.5)
Absolute CD 4 Helper: 417 /uL (ref 359–1519)
Basophils Absolute: 0.1 x10E3/uL (ref 0.0–0.2)
Basos: 1 %
CD3+CD4+ Cells/CD3+CD8+ Cells Bld: 0.56 — ABNORMAL LOW (ref 0.92–3.72)
CD3+CD8+ Cells # Bld: 744 /uL (ref 109–897)
CD3+CD8+ Cells NFr Bld: 57.2 % — ABNORMAL HIGH (ref 12.0–35.5)
EOS (ABSOLUTE): 0 x10E3/uL (ref 0.0–0.4)
Eos: 1 %
Hematocrit: 36.1 % — ABNORMAL LOW (ref 37.5–51.0)
Hemoglobin: 11.6 g/dL — ABNORMAL LOW (ref 13.0–17.7)
Immature Grans (Abs): 0 x10E3/uL (ref 0.0–0.1)
Immature Granulocytes: 0 %
Lymphocytes Absolute: 1.3 x10E3/uL (ref 0.7–3.1)
Lymphs: 17 %
MCH: 33.5 pg — ABNORMAL HIGH (ref 26.6–33.0)
MCHC: 32.1 g/dL (ref 31.5–35.7)
MCV: 104 fL — ABNORMAL HIGH (ref 79–97)
Monocytes Absolute: 0.6 x10E3/uL (ref 0.1–0.9)
Monocytes: 8 %
Neutrophils Absolute: 5.3 x10E3/uL (ref 1.4–7.0)
Neutrophils: 73 %
Platelets: 353 x10E3/uL (ref 150–450)
RBC: 3.46 x10E6/uL — ABNORMAL LOW (ref 4.14–5.80)
RDW: 12.7 % (ref 11.6–15.4)
WBC: 7.3 x10E3/uL (ref 3.4–10.8)

## 2024-03-18 LAB — CBC WITH DIFFERENTIAL/PLATELET
Abs Immature Granulocytes: 0.02 K/uL (ref 0.00–0.07)
Basophils Absolute: 0.1 K/uL (ref 0.0–0.1)
Basophils Relative: 1 %
Eosinophils Absolute: 0.2 K/uL (ref 0.0–0.5)
Eosinophils Relative: 4 %
HCT: 34.5 % — ABNORMAL LOW (ref 39.0–52.0)
Hemoglobin: 11.4 g/dL — ABNORMAL LOW (ref 13.0–17.0)
Immature Granulocytes: 0 %
Lymphocytes Relative: 18 %
Lymphs Abs: 1.1 K/uL (ref 0.7–4.0)
MCH: 32.4 pg (ref 26.0–34.0)
MCHC: 33 g/dL (ref 30.0–36.0)
MCV: 98 fL (ref 80.0–100.0)
Monocytes Absolute: 0.6 K/uL (ref 0.1–1.0)
Monocytes Relative: 10 %
Neutro Abs: 4.2 K/uL (ref 1.7–7.7)
Neutrophils Relative %: 67 %
Platelets: 321 K/uL (ref 150–400)
RBC: 3.52 MIL/uL — ABNORMAL LOW (ref 4.22–5.81)
RDW: 13 % (ref 11.5–15.5)
WBC: 6.3 K/uL (ref 4.0–10.5)
nRBC: 0 % (ref 0.0–0.2)

## 2024-03-18 LAB — BASIC METABOLIC PANEL WITH GFR
Anion gap: 10 (ref 5–15)
BUN: 14 mg/dL (ref 6–20)
CO2: 28 mmol/L (ref 22–32)
Calcium: 9 mg/dL (ref 8.9–10.3)
Chloride: 102 mmol/L (ref 98–111)
Creatinine, Ser: 1.33 mg/dL — ABNORMAL HIGH (ref 0.61–1.24)
GFR, Estimated: >60 mL/min (ref >60)
Glucose, Bld: 123 mg/dL — ABNORMAL HIGH (ref 70–99)
Potassium: 3.4 mmol/L — ABNORMAL LOW (ref 3.5–5.1)
Sodium: 140 mmol/L (ref 135–145)

## 2024-03-18 LAB — IRON AND TIBC
Iron: 81 ug/dL (ref 45–182)
Saturation Ratios: 22 % (ref 17.9–39.5)
TIBC: 371 ug/dL (ref 250–450)
UIBC: 290 ug/dL

## 2024-03-18 LAB — PHOSPHORUS: Phosphorus: 4.2 mg/dL (ref 2.5–4.6)

## 2024-03-18 LAB — FOLATE: Folate: 17.6 ng/mL (ref >5.9)

## 2024-03-18 LAB — VITAMIN B12: Vitamin B-12: 225 pg/mL (ref 180–914)

## 2024-03-18 LAB — MAGNESIUM: Magnesium: 2 mg/dL (ref 1.7–2.4)

## 2024-03-18 MED ORDER — CYANOCOBALAMIN 1000 MCG/ML IJ SOLN
1000.0000 ug | Freq: Every day | INTRAMUSCULAR | Status: DC
Start: 2024-03-18 — End: 2024-03-21

## 2024-03-18 MED ORDER — CYANOCOBALAMIN 1000 MCG/ML IJ SOLN
1000.0000 ug | Freq: Once | INTRAMUSCULAR | Status: DC
  Filled 2024-03-18: qty 1

## 2024-03-18 MED ORDER — ASPIRIN 81 MG PO TBEC
81.0000 mg | DELAYED_RELEASE_TABLET | Freq: Every day | ORAL | Status: DC
  Administered 2024-03-19 – 2024-03-21 (×3): 81 mg via ORAL
  Filled 2024-03-18 (×3): qty 1

## 2024-03-18 MED ORDER — POTASSIUM CHLORIDE CRYS ER 20 MEQ PO TBCR
40.0000 meq | EXTENDED_RELEASE_TABLET | Freq: Once | ORAL | Status: AC
  Administered 2024-03-18: 40 meq via ORAL
  Filled 2024-03-18: qty 2

## 2024-03-18 MED ORDER — SACUBITRIL-VALSARTAN 49-51 MG PO TABS: 1.0000 | ORAL_TABLET | Freq: Two times a day (BID) | ORAL | Status: DC

## 2024-03-18 MED ORDER — BICTEGRAVIR-EMTRICITAB-TENOFOV 50-200-25 MG PO TABS
1.0000 | ORAL_TABLET | Freq: Every day | ORAL | Status: DC
  Administered 2024-03-19 – 2024-03-21 (×3): 1 via ORAL
  Filled 2024-03-18 (×3): qty 1

## 2024-03-18 MED ORDER — THIAMINE MONONITRATE 100 MG PO TABS
100.0000 mg | ORAL_TABLET | Freq: Every day | ORAL | Status: DC
  Administered 2024-03-19 – 2024-03-21 (×3): 100 mg via ORAL
  Filled 2024-03-18 (×3): qty 1

## 2024-03-18 MED ORDER — POTASSIUM CHLORIDE 20 MEQ PO PACK
20.0000 meq | PACK | Freq: Once | ORAL | Status: AC
  Administered 2024-03-18: 20 meq via ORAL
  Filled 2024-03-18: qty 1

## 2024-03-18 MED ORDER — ENOXAPARIN SODIUM 40 MG/0.4ML IJ SOSY
40.0000 mg | PREFILLED_SYRINGE | Freq: Every day | INTRAMUSCULAR | Status: DC
  Administered 2024-03-18 – 2024-03-20 (×3): 40 mg via SUBCUTANEOUS
  Filled 2024-03-18 (×3): qty 0.4

## 2024-03-18 MED ORDER — VITAMIN B-12 1000 MCG PO TABS
1000.0000 ug | ORAL_TABLET | Freq: Every day | ORAL | Status: DC
  Administered 2024-03-19 – 2024-03-21 (×3): 1000 ug via ORAL
  Filled 2024-03-18 (×3): qty 1

## 2024-03-18 MED ORDER — CARVEDILOL 6.25 MG PO TABS
6.2500 mg | ORAL_TABLET | Freq: Two times a day (BID) | ORAL | Status: DC
  Administered 2024-03-19 – 2024-03-20 (×4): 6.25 mg via ORAL
  Filled 2024-03-18 (×5): qty 1

## 2024-03-18 MED ORDER — FOLIC ACID 1 MG PO TABS
1.0000 mg | ORAL_TABLET | Freq: Every day | ORAL | Status: DC
  Administered 2024-03-19 – 2024-03-21 (×3): 1 mg via ORAL
  Filled 2024-03-18 (×3): qty 1

## 2024-03-18 MED ORDER — ATORVASTATIN CALCIUM 40 MG PO TABS
40.0000 mg | ORAL_TABLET | Freq: Every day | ORAL | Status: DC
  Administered 2024-03-19 – 2024-03-21 (×3): 40 mg via ORAL
  Filled 2024-03-18 (×3): qty 1

## 2024-03-18 MED ORDER — VITAMIN B-12 1000 MCG PO TABS: 1000.0000 ug | ORAL_TABLET | Freq: Every day | ORAL | Status: AC

## 2024-03-18 MED ORDER — THIAMINE HCL 100 MG/ML IJ SOLN: 100.0000 mg | Freq: Every day | INTRAMUSCULAR | Status: DC

## 2024-03-18 MED ORDER — ADULT MULTIVITAMIN W/MINERALS CH
1.0000 | ORAL_TABLET | Freq: Every day | ORAL | Status: DC
  Administered 2024-03-19: 1 via ORAL
  Filled 2024-03-18: qty 1

## 2024-03-18 MED ORDER — INSULIN ASPART 100 UNIT/ML IJ SOLN
0.0000 [IU] | Freq: Three times a day (TID) | INTRAMUSCULAR | Status: DC
  Administered 2024-03-19: 1 [IU] via SUBCUTANEOUS
  Administered 2024-03-19: 2 [IU] via SUBCUTANEOUS
  Administered 2024-03-19: 1 [IU] via SUBCUTANEOUS
  Administered 2024-03-20 – 2024-03-21 (×2): 3 [IU] via SUBCUTANEOUS

## 2024-03-18 NOTE — Plan of Care (Signed)
 D/C

## 2024-03-18 NOTE — Progress Notes (Signed)
 S: Patient continues to have multiple episodes of not speaking lasting for a few minutes. No other neurologic deficits.  O:  Exam Neuro: Mental Status: Patient is awake, alert, oriented to person, place, month, year, and situation. Patient is able to give a clear and coherent history. No signs of aphasia or neglect Cranial Nerves: II: Visual Fields are full. Pupils are equal, round, and reactive to light.   III,IV, VI: EOMI without ptosis or diploplia.  V: Facial sensation is symmetric to temperature VII: Facial movement is symmetric.  VIII: hearing is intact to voice X: Uvula elevates symmetrically XII: tongue is midline without atrophy or fasciculations.  Motor: Tone is normal. Bulk is normal. 5/5 strength was present in all four extremities.  Sensory: Sensation is symmetric to light touch and temperature in the arms and legs. Cerebellar: FNF and HKS are intact bilaterally   MRI Brain(Personally reviewed): Negative for acute finding  CTA H&N 1. No large vessel occlusion or significant stenosis in the head or neck. 2. No acute intracranial abnormality. 3. 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  EEG: mild diffuse slowing  A/P: Manuel Rosales is a 58 y.o. male with recurrent episodes of aphemia with normal MRI brain and rEEG which showed just diffuse slowing.   Recommend transfer to [facility] for cEEG for spell characterization.  Elida Ross, MD [facility] [phone]  If 7pm- 7am, please page neurology on call as listed in [facility].

## 2024-03-18 NOTE — Progress Notes (Signed)
 Patient picked up by care link and transported to [facility] for 24 hour EEG monitoring. Patient has contacted his brother and sister and are aware that he is being transferred.

## 2024-03-18 NOTE — Discharge Instructions (Signed)
 Transfer to [facility] for continuous EEG

## 2024-03-18 NOTE — Plan of Care (Signed)
   Problem: Ischemic Stroke/TIA Tissue Perfusion: Goal: Complications of ischemic stroke/TIA will be minimized Outcome: Progressing

## 2024-03-18 NOTE — Discharge Summary (Signed)
 [facility] Discharge Summary   Patient: Manuel Rosales FMW:[id number]  PCP: [facility]  Date of admission: 03/16/2024   Date of discharge:  03/18/2024     Discharge Diagnoses:  Principal Problem:   Focal neurological deficit Active Problems:   CHF (congestive heart failure) (HCC)   HIV disease (HCC)   HTN (hypertension)   Coronary artery disease   Admitted From: Home Disposition: Transfer to [facility] For continuous EEG monitoring to characterize Aphemia spells/rule out seizures.  Recommendations for Outpatient Follow-up:  PCP: As per discharge recommendation from [facility] Follow-up with neurology Follow up LABS/TEST:     Diet recommendation: Cardiac diet  Activity: The patient is advised to gradually reintroduce usual activities, as tolerated  Discharge Condition: stable  Code Status: Full code   History of present illness: As per the H and P dictated on admission  Hospital Course:  Manuel Rosales is a 58 y.o. male with medical history significant for HIV, CAD, CHF, HTN being admitted with concerns for TIA versus seizure.  Last known well was in the afternoon of 03/15/2024.  Family reports that during the course of the day he has had intermittent spells of word finding difficulties, unbalanced gait, and slow response.  While in the ED he had several episodes of word finding difficulty associated with staring lasting just a few seconds and then will be back to baseline.   In the ED, vitals within normal limits.  Labs (CBC, CMP, INR, troponin, EtOH) all unremarkable except for mild anemia of 11.8 and mild hypokalemia of 3.3. UDS pending.  CD4/CD8 pending. EKG with sinus rhythm at 84 chest x-ray nonacute Head CT with chronic changes without acute abnormality MRI brain with and without contrast pending No treatment administered in the ED Admission requested.    Assessment and plan:  # Aphemia spells, unknown cause could be  due to seizures versus psychological CT head with chronic changes without acute abnormality MRI brain with and without contrast no acute intracranial abnormality. 2. Mildly age advanced cerebral white matter signal changes, nonspecific but most commonly due to chronic small vessel disease. 3. 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 CTA head/neck: 1. No large vessel occlusion or significant stenosis in the head or neck. 2. No acute intracranial abnormality. 3. 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 EEG: Normal EEG recording, no seizures. Neurology consulted, recommended continuous EEG monitoring and transferred to [facility]. Patient will be followed with neurology over there Continue seizure precautions    Coronary artery disease No complaints of chest pain, troponin normal and EKG nonacute Continue atorvastatin  and aspirin    HTN (hypertension) Holding meds for permissive hypertension   HIV disease (HCC) Follow-up CD4 Continue Biktarvy    CHF (congestive heart failure) (HCC) Clinically euvolemic Holding carvedilol , Entresto  for permissive hypertension      Body mass index is 19.95 kg/m.  Nutrition Interventions:  - Patient was instructed, not to drive, operate heavy machinery, perform activities at heights, swimming or participation in water activities or provide baby sitting services while on Pain, Sleep and Anxiety Medications; until his outpatient Physician has advised to do so again.  - Also recommended to not to take more than prescribed Pain, Sleep and Anxiety Medications.  Vital signs stable, patient is being transferred to [facility] for continuous EEG monitoring as per neurology recommendation.   Consultants: Neurology Procedures: EEG  Discharge Exam: General: Appear in no distress, no Rash; Oral Mucosa Clear, moist. Cardiovascular:  S1 and S2 Present, no Murmur, Respiratory: normal respiratory effort, Bilateral Air  entry present and no Crackles, no wheezes Abdomen: Bowel Sound present, Soft and no tenderness, no hernia Extremities: no Pedal edema, no calf tenderness Neurology: Aphemia spells, unable to produce a speech off-and-on.  No any focal deficits. affect appropriate.  Filed Weights   03/16/24 2153 03/17/24 2245  Weight: 74.1 kg 68.6 kg   Vitals:   03/18/24 0848 03/18/24 1212  BP: (!) 142/98 119/70  Pulse: 69 84  Resp:    Temp: 98.2 F (36.8 C) 98.5 F (36.9 C)  SpO2: 100% 100%    DISCHARGE MEDICATION: Allergies as of 03/18/2024   No Known Allergies      Medication List     TAKE these medications    aspirin  EC 81 MG tablet Take 1 tablet (81 mg total) by mouth daily.   atorvastatin  40 MG tablet Commonly known as: LIPITOR Take 1 tablet by mouth once daily   bictegravir-emtricitabine -tenofovir  AF 50-200-25 MG Tabs tablet Commonly known as: BIKTARVY  Take 1 tablet by mouth daily.   carvedilol  6.25 MG tablet Commonly known as: COREG  Take 1 tablet (6.25 mg total) by mouth 2 (two) times daily. Keep appt with [facility] for refills.   cetirizine 10 MG tablet Commonly known as: ZYRTEC Take 10 mg by mouth daily. PRN   cyanocobalamin  1000 MCG/ML injection Commonly known as: VITAMIN B12 Inject 1 mL (1,000 mcg total) into the muscle daily at 12 noon for 7 doses.   cyanocobalamin  1000 MCG tablet Commonly known as: VITAMIN B12 Take 1 tablet (1,000 mcg total) by mouth daily.   cyclobenzaprine  5 MG tablet Commonly known as: FLEXERIL  Take 1 tablet (5 mg total) by mouth 3 (three) times daily as needed.   furosemide  20 MG tablet Commonly known as: LASIX  Take 1 tablet by mouth once daily   glipiZIDE 5 MG tablet Commonly known as: GLUCOTROL Take 5 mg by mouth daily before breakfast.   ibuprofen  800 MG tablet Commonly known as: ADVIL  Take 1 tablet (800 mg total) by mouth every 8 (eight) hours as needed.   metFORMIN 1000 MG tablet Commonly known as: GLUCOPHAGE Take 1,000  mg by mouth 2 (two) times daily with a meal.   nitroGLYCERIN  0.4 MG SL tablet Commonly known as: NITROSTAT  Place 1 tablet (0.4 mg total) under the tongue every 5 (five) minutes as needed for chest pain.   sacubitril -valsartan  49-51 MG Commonly known as: ENTRESTO  Take 1 tablet by mouth 2 (two) times daily.       No Known Allergies Discharge Instructions     Diet - low sodium heart healthy   Complete by: As directed    Discharge instructions   Complete by: As directed    Transferred to [facility] for continuous EEG monitoring.  Patient need to follow with neurology.   Increase activity slowly   Complete by: As directed        The results of significant diagnostics from this hospitalization (including imaging, microbiology, ancillary and laboratory) are listed below for reference.    Significant Diagnostic Studies: CT ANGIO HEAD NECK W WO CM Result Date: 03/17/2024 EXAM: CTA Head and Neck with Intravenous Contrast. CT Head without Contrast. CLINICAL HISTORY: Stroke, follow up. Patient was brought in because of intermittent episodes of word finding difficulty on balance gait and slow response to verbal commands. TECHNIQUE: Axial CTA images of the head and neck performed with intravenous contrast. MIP reconstructed images were created and reviewed. Axial computed tomography  images of the head/brain performed without intravenous contrast. Note: Per PQRS, the description of internal carotid artery percent stenosis, including 0 percent or normal exam, is based on [place] American Symptomatic Carotid Endarterectomy Trial (NASCET) criteria. Dose reduction technique was used including one or more of the following: automated exposure control, adjustment of mA and kV according to patient size, and/or iterative reconstruction. CONTRAST: 75 ml omnipaque  350 COMPARISON: Head CT 03/16/2024 and MRI 03/17/2024 FINDINGS: CT HEAD: BRAIN: Scattered small hypodensities in the cerebral white matter,  nonspecific but compatible with mild chronic small vessel ischemic disease. Mild generalized cerebral atrophy. No evidence of an acute infarct, intracranial hemorrhage, mass, midline shift, or extra-axial fluid collection. VENTRICLES: No hydrocephalus. ORBITS: The orbits are unremarkable. SINUSES AND MASTOIDS: Chronic sinusitis with complete opacification of the left frontal, anterior ethmoid, and maxillary sinuses. Small volume petrous apex fluid bilaterally. CTA NECK: AORTIC ARCH: Common origin of the brachiocephalic and left common carotid arteries, a normal variant. Widely patent arch vessel origins. COMMON CAROTID ARTERIES: Mild atheromatous irregularity of both common carotid arteries without stenosis or dissection. INTERNAL CAROTID ARTERIES: Small amount of atherosclerotic plaque in the carotid bulbs without significant stenosis or dissection. VERTEBRAL ARTERIES: No significant stenosis. No dissection or occlusion. CTA HEAD: ANTERIOR CEREBRAL ARTERIES: No significant stenosis. No occlusion. No aneurysm. MIDDLE CEREBRAL ARTERIES: No significant stenosis. No occlusion. No aneurysm. POSTERIOR CEREBRAL ARTERIES: No significant stenosis. No occlusion. No aneurysm. BASILAR ARTERY: No significant stenosis. No occlusion. No aneurysm. OTHER: SOFT TISSUES: No acute finding. No masses or lymphadenopathy. BONES: Cervical spondylosis. Focally severe facet arthrosis on the right at C3-4 and C2-3. IMPRESSION: 1. No large vessel occlusion or significant stenosis in the head or neck. 2. No acute intracranial abnormality. 3. 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 Electronically signed by: Dasie Hamburg MD 03/17/2024 07:06 PM EDT RP Workstation: [id number]   ECHOCARDIOGRAM COMPLETE BUBBLE STUDY Result Date: 03/17/2024    ECHOCARDIOGRAM REPORT   Patient Name:   Manuel Rosales Date of Exam: 03/17/2024 Medical Rec #:  [id number]       Height:       73.0 in Accession #:    [id number]      Weight:       163.4 lb Date of  Birth:  12/18/1965      BSA:          1.974 m Patient Age:    57 years        BP:           141/103 mmHg Patient Gender: M               HR:           67 bpm. Exam Location:  Inpatient Procedure: 2D Echo (Both Spectral and Color Flow Doppler were utilized during            procedure). Indications:     stroke  History:         Patient has prior history of Echocardiogram examinations, most                  recent 08/25/2018. CHF, CAD, HIV; Risk Factors:Current Smoker                  and Hypertension.  Sonographer:     Tinnie Barefoot RDCS Referring Phys:  [id number] DELAYNE LULLA SOLIAN Diagnosing Phys: Evalene Lunger MD  Sonographer Comments: Uncooperative due to stroke. IMPRESSIONS  1. Left ventricular ejection fraction, by estimation, is 55 to 60%.  Left ventricular ejection fraction by PLAX is 54 %. The left ventricle has normal function. The left ventricle has no regional wall motion abnormalities. Left ventricular diastolic parameters are consistent with Grade I diastolic dysfunction (impaired relaxation).  2. Right ventricular systolic function is normal. The right ventricular size is normal. There is normal pulmonary artery systolic pressure. The estimated right ventricular systolic pressure is 20.1 mmHg.  3. The mitral valve is normal in structure. No evidence of mitral valve regurgitation. No evidence of mitral stenosis.  4. The aortic valve is tricuspid. Aortic valve regurgitation is not visualized. No aortic stenosis is present.  5. The inferior vena cava is normal in size with greater than 50% respiratory variability, suggesting right atrial pressure of 3 mmHg. FINDINGS  Left Ventricle: Left ventricular ejection fraction, by estimation, is 55 to 60%. Left ventricular ejection fraction by PLAX is 54 %. The left ventricle has normal function. The left ventricle has no regional wall motion abnormalities. Strain was performed and the global longitudinal strain is indeterminate. The left ventricular internal cavity  size was normal in size. There is no left ventricular hypertrophy. Left ventricular diastolic parameters are consistent with Grade I diastolic dysfunction  (impaired relaxation). Right Ventricle: The right ventricular size is normal. No increase in right ventricular wall thickness. Right ventricular systolic function is normal. There is normal pulmonary artery systolic pressure. The tricuspid regurgitant velocity is 2.07 m/s, and  with an assumed right atrial pressure of 3 mmHg, the estimated right ventricular systolic pressure is 20.1 mmHg. Left Atrium: Left atrial size was normal in size. Right Atrium: Right atrial size was normal in size. Pericardium: There is no evidence of pericardial effusion. Mitral Valve: The mitral valve is normal in structure. No evidence of mitral valve regurgitation. No evidence of mitral valve stenosis. Tricuspid Valve: The tricuspid valve is normal in structure. Tricuspid valve regurgitation is mild . No evidence of tricuspid stenosis. Aortic Valve: The aortic valve is tricuspid. Aortic valve regurgitation is not visualized. No aortic stenosis is present. Pulmonic Valve: The pulmonic valve was normal in structure. Pulmonic valve regurgitation is not visualized. No evidence of pulmonic stenosis. Aorta: The aortic root is normal in size and structure. Venous: The inferior vena cava is normal in size with greater than 50% respiratory variability, suggesting right atrial pressure of 3 mmHg. IAS/Shunts: No atrial level shunt detected by color flow Doppler. Agitated saline contrast was given intravenously to evaluate for intracardiac shunting. Additional Comments: 3D was performed not requiring image post processing on an independent workstation and was indeterminate.  LEFT VENTRICLE PLAX 2D LV EF:         Left            Diastology                ventricular     LV e' medial:    8.59 cm/s                ejection        LV E/e' medial:  9.0                fraction by     LV e' lateral:    10.90 cm/s                PLAX is 54      LV E/e' lateral: 7.1                %. LVIDd:  4.70 cm LVIDs:         3.40 cm LV PW:         1.10 cm LV IVS:        1.00 cm LVOT diam:     1.90 cm LV SV:         55 LV SV Index:   28 LVOT Area:     2.84 cm  RIGHT VENTRICLE             IVC RV Basal diam:  3.40 cm     IVC diam: 1.30 cm RV S prime:     14.00 cm/s TAPSE (M-mode): 2.2 cm LEFT ATRIUM             Index        RIGHT ATRIUM           Index LA diam:        2.80 cm 1.42 cm/m   RA Area:     14.20 cm LA Vol (A2C):   39.5 ml 20.01 ml/m  RA Volume:   37.90 ml  19.20 ml/m LA Vol (A4C):   42.8 ml 21.68 ml/m LA Biplane Vol: 41.9 ml 21.22 ml/m  AORTIC VALVE LVOT Vmax:   89.60 cm/s LVOT Vmean:  56.100 cm/s LVOT VTI:    0.193 m  AORTA Ao Root diam: 2.90 cm Ao Asc diam:  3.00 cm MITRAL VALVE               TRICUSPID VALVE MV Area (PHT): 3.99 cm    TR Peak grad:   17.1 mmHg MV Decel Time: 190 msec    TR Vmax:        207.00 cm/s MV E velocity: 77.00 cm/s MV A velocity: 91.20 cm/s  SHUNTS MV E/A ratio:  0.84        Systemic VTI:  0.19 m                            Systemic Diam: 1.90 cm Evalene Lunger MD Electronically signed by Evalene Lunger MD Signature Date/Time: 03/17/2024/5:05:35 PM    Final    EEG adult Result Date: 03/17/2024 Michaela Aisha SQUIBB, MD     03/17/2024  5:06 PM History: 58 year old with intermittent episodes of aphemia EEG Duration: 23 minutes Sedation: None Patient State: Awake and drowsy Technique: This EEG was acquired with electrodes placed according to the International 10-20 electrode system (including Fp1, Fp2, F3, F4, C3, C4, P3, P4, O1, O2, T3, T4, T5, T6, A1, A2, Fz, Cz, Pz). The following electrodes were missing or displaced: none. Background: The background consists of intermixed alpha and beta activities. There is a well defined posterior dominant rhythm of 9-10 Hz that attenuates with eye opening.  During the recording he had a single episode characterized by psychomotor agitation  and refusal to speak.  During the episode, there is no significant change to the underlying rhythms until the end at which point there is a suggestion of a left-sided rhythmic activity that appears artifactual in nature without any evolution.  He is moving his arm at the time, and I wonder if this is movement related.  The same pattern is seen subsequently when the patient is asymptomatic as well.  There was no definite epileptiform discharge. Photic stimulation: Physiologic driving is not performed EEG Abnormalities: No definite abnormalities Clinical Interpretation: This normal EEG is recorded in the waking and drowsy state. There was no seizure or seizure predisposition recorded  on this study. Please note that lack of epileptiform activity on EEG does not preclude the possibility of epilepsy. Though there was no definite evidence of epileptic activity, if the patient continues to have episodes, could consider transfer for longer-term monitoring but at the current time I do not think that this study supports an epileptic nature to his events. Aisha Seals, MD [facility] If 7pm- 7am, please page neurology on call as listed in AMION.  MR Brain W and Wo Contrast Result Date: 03/17/2024 CLINICAL DATA:  58 year old male with altered mental status. CHF, hypertension, TIA. EXAM: MRI HEAD WITHOUT AND WITH CONTRAST TECHNIQUE: Multiplanar, multiecho pulse sequences of the brain and surrounding structures were obtained without and with intravenous contrast. CONTRAST:  7mL GADAVIST  GADOBUTROL  1 MMOL/ML IV SOLN COMPARISON:  Head CT yesterday. FINDINGS: Brain: No restricted diffusion to suggest acute infarction. No midline shift, mass effect, evidence of mass lesion, ventriculomegaly, extra-axial collection or acute intracranial hemorrhage. Cervicomedullary junction and pituitary are within normal limits. Scattered cerebral white matter T2 and FLAIR hyperintensity, mildly advanced for age in most notable  in the right centrum semiovale (series 8, image 36). No cortical encephalomalacia identified. No chronic cerebral blood products on SWI. Cerebral volume is within normal limits for age. Deep gray nuclei, brainstem and cerebellum are within normal limits for age. Vascular: Major intracranial vascular flow voids are preserved. And following contrast the major dural venous sinuses are enhancing and appear to be patent. Skull and upper cervical spine: Visualized bone marrow signal is within normal limits. Negative visible cervical spine. Sinuses/Orbits: Negative orbits. Heterogeneous complete opacification of the left frontal, anterior ethmoid, and maxillary sinuses. Periosteal thickening demonstrated on CT. Minor paranasal sinus mucosal thickening otherwise. Small volume retained secretions in the nasopharynx. Other: Trace bilateral petrous apex air cell fluid, mastoids generally well aerated. Grossly normal visible internal auditory structures. IMPRESSION: 1. No acute intracranial abnormality. 2. Mildly age advanced cerebral white matter signal changes, nonspecific but most commonly due to chronic small vessel disease. 3. 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 Electronically Signed   By: VEAR Hurst M.D.   On: 03/17/2024 04:13   DG Chest 1 View Result Date: 03/16/2024 CLINICAL DATA:  Altered mental status with unbalanced gait. EXAM: CHEST  1 VIEW COMPARISON:  June 29, 2017 FINDINGS: The heart size and mediastinal contours are within normal limits. Both lungs are clear. The visualized skeletal structures are unremarkable. IMPRESSION: No active disease. Electronically Signed   By: Suzen Dials M.D.   On: 03/16/2024 23:45   CT HEAD WO CONTRAST Result Date: 03/16/2024 CLINICAL DATA:  Intermittent aphasia EXAM: CT HEAD WITHOUT CONTRAST TECHNIQUE: Contiguous axial images were obtained from the base of the skull through the vertex without intravenous contrast. RADIATION DOSE  REDUCTION: This exam was performed according to the departmental dose-optimization program which includes automated exposure control, adjustment of the mA and/or kV according to patient size and/or use of iterative reconstruction technique. COMPARISON:  None Available. FINDINGS: Brain: No evidence of acute infarction, hemorrhage, hydrocephalus, extra-axial collection or mass lesion/mass effect. Mild atrophic changes are noted. Vascular: No hyperdense vessel or unexpected calcification. Skull: Normal. Negative for fracture or focal lesion. Sinuses/Orbits: Opacification of the left frontal sinus is noted. Other: None. IMPRESSION: Chronic changes without acute abnormality. Electronically Signed   By: Oneil Devonshire M.D.   On: 03/16/2024 23:19    Microbiology: No results found for this or any previous visit (from the past 240 hours).   Labs: CBC: Recent  Labs  Lab 03/16/24 2200 03/18/24 0903  WBC 7.8 6.3  NEUTROABS 6.1 4.2  HGB 11.8* 11.4*  HCT 35.8* 34.5*  MCV 99.7 98.0  PLT 366 321   Basic Metabolic Panel: Recent Labs  Lab 03/16/24 2200 03/18/24 0903  NA 141 140  K 3.3* 3.4*  CL 104 102  CO2 27 28  GLUCOSE 148* 123*  BUN 13 14  CREATININE 1.21 1.33*  CALCIUM  9.2 9.0  MG  --  2.0  PHOS  --  4.2   Liver Function Tests: Recent Labs  Lab 03/16/24 2200  AST 43*  ALT 18  ALKPHOS 53  BILITOT 0.9  PROT 7.6  ALBUMIN 3.9   No results for input(s): LIPASE, AMYLASE in the last 168 hours. No results for input(s): AMMONIA in the last 168 hours. Cardiac Enzymes: No results for input(s): CKTOTAL, CKMB, CKMBINDEX, TROPONINI in the last 168 hours. BNP (last 3 results) No results for input(s): BNP in the last 8760 hours. CBG: No results for input(s): GLUCAP in the last 168 hours.  Time spent: 35 minutes  Signed:  Elvan Sor  [facility] 03/18/2024 3:39 PM

## 2024-03-18 NOTE — Progress Notes (Signed)
 Speaking with patient re: his medication. Pt stopped speaking, motioned that he could not talk. Pt was able to push/pull hands and feet, had motor control, did not lose control of his bowel or bladder. Pt was able to follow commands, open his eyes and lift all limbs. Dining entered the room after about 3 mins, and patient was able to talk and ask for his tray. Pt was then mute for another minute and then back to talking, directing staff to sit him up in the bed. Pt in NAD. No deficits noted during either episode. Will notify MD via epic chat of above.

## 2024-03-18 NOTE — Plan of Care (Signed)
  Problem: Coping: Goal: Level of anxiety will decrease Outcome: Progressing   Problem: Pain Managment: Goal: General experience of comfort will improve and/or be controlled Outcome: Progressing   Problem: Safety: Goal: Ability to remain free from injury will improve Outcome: Progressing   Problem: Skin Integrity: Goal: Risk for impaired skin integrity will decrease Outcome: Progressing

## 2024-03-18 NOTE — H&P (Signed)
 History and Physical    Manuel Rosales FMW:[id number] DOB: 1966/05/21 DOA: 03/18/2024  Patient coming from: Patient was transferred from [facility].  Chief Complaint: Speech difficulties.  HPI: Manuel Rosales is a 58 y.o. male with history of HIV, chronic HFrEF with improved EF, CAD, anemia, had presented to the ER at [facility] with complaints of difficulty speaking.  Patient was admitted to [facility] with concern for possible seizures versus TIA.  MRI brain and CT angiogram head and neck were unremarkable.  Given the intermittent episodes of patient having difficulty bringing out words neurologist wanted patient to be having continuous EEG monitoring and was transferred to [facility].  Labs show mild hypokalemia and anemia.  2D echo done showed EF of 55 to 60%.  At the time of my exam patient has some difficulty speaking at times.  Moving all extremities.  Patient denies any headache chest pain or shortness of breath.  States he drinks alcohol and smokes cigarettes every day.  ED Course: Patient is a direct admit from [facility].  Review of Systems: As per HPI, rest all negative.   Past Medical History:  Diagnosis Date   Chronic systolic CHF (congestive heart failure) (HCC) 06/2017   a. TTE 11/18: EF of 20-25% , difuse hypokinesis, mild to moderate tricuspid regurgitation, and mild pulmonary hypertension   Coronary artery disease 06/2017   Non-ST elevation myocardial infarction.  Treated medically with no cardiac cath done due to active infection and renal failure.   Dermatitis    HIV (human immunodeficiency virus infection) (HCC)    Hypertension    Pulmonary hypertension (HCC)    a. TTE 11/18: EF of 20-25% , difuse hypokinesis, mild to moderate tricuspid regurgitation, and mild pulmonary hypertension w/ PASP 40 mmHg    Past Surgical History:  Procedure Laterality Date   APPENDECTOMY     IR RADIOLOGIST EVAL & MGMT  06/19/2017     reports that he has been smoking cigarettes. He has never  used smokeless tobacco. He reports current alcohol use of about 2.0 standard drinks of alcohol per week. He reports that he does not currently use drugs after having used the following drugs: Marijuana.  No Known Allergies  Family History  Problem Relation Age of Onset   Cerebral aneurysm Brother        died   Hypertension Mother    Diabetes Mother    Heart attack Father     Prior to Admission medications   Medication Sig Start Date End Date Taking? Authorizing Provider  aspirin  81 MG EC tablet Take 1 tablet (81 mg total) by mouth daily. 06/24/18   Donette Ellouise LABOR, FNP  atorvastatin  (LIPITOR) 40 MG tablet Take 1 tablet by mouth once daily 12/03/23   Donette Ellouise A, FNP  bictegravir-emtricitabine -tenofovir  AF (BIKTARVY ) 50-200-25 MG TABS tablet Take 1 tablet by mouth daily.    [provider]  carvedilol  (COREG ) 6.25 MG tablet Take 1 tablet (6.25 mg total) by mouth 2 (two) times daily. Keep appt with [facility] for refills. 12/19/23   Donette Ellouise LABOR, FNP  cetirizine (ZYRTEC) 10 MG tablet Take 10 mg by mouth daily. PRN Patient not taking: Reported on 03/17/2024    [provider]  cyanocobalamin  (VITAMIN B12) 1000 MCG tablet Take 1 tablet (1,000 mcg total) by mouth daily. 03/18/24 06/16/24  Von Bellis, MD  cyanocobalamin  (VITAMIN B12) 1000 MCG/ML injection Inject 1 mL (1,000 mcg total) into the muscle daily at 12 noon for 7 doses. 03/18/24 03/25/24  Von Bellis, MD  cyclobenzaprine  (FLEXERIL ) 5 MG tablet Take 1 tablet (5 mg total) by mouth 3 (three) times daily as needed. Patient not taking: Reported on 03/17/2024 07/31/22   Menshew, Candida LULLA Kings, PA-C  furosemide  (LASIX ) 20 MG tablet Take 1 tablet by mouth once daily 02/27/24   Hackney, Tina A, FNP  glipiZIDE (GLUCOTROL) 5 MG tablet Take 5 mg by mouth daily before breakfast.    [provider]  ibuprofen  (ADVIL ) 800 MG tablet Take 1 tablet (800 mg total) by mouth every 8 (eight) hours as needed. Patient not  taking: Reported on 03/17/2024 07/31/22   Menshew, Candida LULLA Kings, PA-C  metFORMIN (GLUCOPHAGE) 1000 MG tablet Take 1,000 mg by mouth 2 (two) times daily with a meal.    [provider]  nitroGLYCERIN  (NITROSTAT ) 0.4 MG SL tablet Place 1 tablet (0.4 mg total) under the tongue every 5 (five) minutes as needed for chest pain. 06/24/18   Donette Ellouise LABOR, FNP  sacubitril -valsartan  (ENTRESTO ) 49-51 MG Take 1 tablet by mouth 2 (two) times daily. 02/26/23   Donette Ellouise LABOR, FNP    Physical Exam: Constitutional: Moderately built and nourished. Vitals:   03/18/24 2055  BP: 132/87  Pulse: 79  Resp: 18  Temp: 98.4 F (36.9 C)  TempSrc: Oral  SpO2: 96%   Eyes: Anicteric no pallor. ENMT: No discharge from the ears eyes nose or mouth. Neck: No mass felt.  No neck rigidity. Respiratory: No rhonchi or crepitations. Cardiovascular: S1-S2 heard. Abdomen: Soft nontender bowel sound present. Musculoskeletal: No edema. Skin: No rash. Neurologic: Alert awake oriented time place and person.  Moves all extremities 5 x 5.  No facial asymmetry tongue is midline pupils equal and reacting to light. Psychiatric: Appears normal.  Normal affect.   Labs on Admission: I have personally reviewed following labs and imaging studies  CBC: Recent Labs  Lab 03/16/24 2200 03/16/24 2205 03/18/24 0903  WBC 7.8 7.3 6.3  NEUTROABS 6.1 5.3 4.2  HGB 11.8* 11.6* 11.4*  HCT 35.8* 36.1* 34.5*  MCV 99.7 104* 98.0  PLT 366 353 321   Basic Metabolic Panel: Recent Labs  Lab 03/16/24 2200 03/18/24 0903  NA 141 140  K 3.3* 3.4*  CL 104 102  CO2 27 28  GLUCOSE 148* 123*  BUN 13 14  CREATININE 1.21 1.33*  CALCIUM  9.2 9.0  MG  --  2.0  PHOS  --  4.2   GFR: Estimated Creatinine Clearance: 58 mL/min (A) (by C-G formula based on SCr of 1.33 mg/dL (H)). Liver Function Tests: Recent Labs  Lab 03/16/24 2200  AST 43*  ALT 18  ALKPHOS 53  BILITOT 0.9  PROT 7.6  ALBUMIN 3.9   No results for input(s):  LIPASE, AMYLASE in the last 168 hours. No results for input(s): AMMONIA in the last 168 hours. Coagulation Profile: Recent Labs  Lab 03/16/24 2200  INR 1.0   Cardiac Enzymes: No results for input(s): CKTOTAL, CKMB, CKMBINDEX, TROPONINI in the last 168 hours. BNP (last 3 results) No results for input(s): PROBNP in the last 8760 hours. HbA1C: Recent Labs    03/16/24 2204  HGBA1C 5.1   CBG: No results for input(s): GLUCAP in the last 168 hours. Lipid Profile: Recent Labs    03/17/24 0859  CHOL 120  HDL 59  LDLCALC 40  TRIG 107  CHOLHDL 2.0   Thyroid Function Tests: No results for input(s): TSH, T4TOTAL, FREET4, T3FREE, THYROIDAB in the last 72 hours. Anemia Panel: Recent Labs  03/18/24 0903  VITAMINB12 225  FOLATE 17.6  TIBC 371  IRON 81   Urine analysis:    Component Value Date/Time   COLORURINE STRAW (A) 06/08/2017 1935   APPEARANCEUR CLEAR (A) 06/08/2017 1935   APPEARANCEUR Hazy 07/19/2013 2213   LABSPEC 1.016 06/08/2017 1935   LABSPEC 1.035 07/19/2013 2213   PHURINE 5.0 06/08/2017 1935   GLUCOSEU NEGATIVE 06/08/2017 1935   GLUCOSEU Negative 07/19/2013 2213   HGBUR NEGATIVE 06/08/2017 1935   BILIRUBINUR NEGATIVE 06/08/2017 1935   BILIRUBINUR Negative 07/19/2013 2213   KETONESUR NEGATIVE 06/08/2017 1935   PROTEINUR NEGATIVE 06/08/2017 1935   NITRITE NEGATIVE 06/08/2017 1935   LEUKOCYTESUR NEGATIVE 06/08/2017 1935   LEUKOCYTESUR Negative 07/19/2013 2213   Sepsis Labs: @LABRCNTIP (procalcitonin:4,lacticidven:4) )No results found for this or any previous visit (from the past 240 hours).   Radiological Exams on Admission: CT ANGIO HEAD NECK W WO CM Result Date: 03/17/2024 EXAM: CTA Head and Neck with Intravenous Contrast. CT Head without Contrast. CLINICAL HISTORY: Stroke, follow up. Patient was brought in because of intermittent episodes of word finding difficulty on balance gait and slow response to verbal commands.  TECHNIQUE: Axial CTA images of the head and neck performed with intravenous contrast. MIP reconstructed images were created and reviewed. Axial computed tomography images of the head/brain performed without intravenous contrast. Note: Per PQRS, the description of internal carotid artery percent stenosis, including 0 percent or normal exam, is based on [place] American Symptomatic Carotid Endarterectomy Trial (NASCET) criteria. Dose reduction technique was used including one or more of the following: automated exposure control, adjustment of mA and kV according to patient size, and/or iterative reconstruction. CONTRAST: 75 ml omnipaque  350 COMPARISON: Head CT 03/16/2024 and MRI 03/17/2024 FINDINGS: CT HEAD: BRAIN: Scattered small hypodensities in the cerebral white matter, nonspecific but compatible with mild chronic small vessel ischemic disease. Mild generalized cerebral atrophy. No evidence of an acute infarct, intracranial hemorrhage, mass, midline shift, or extra-axial fluid collection. VENTRICLES: No hydrocephalus. ORBITS: The orbits are unremarkable. SINUSES AND MASTOIDS: Chronic sinusitis with complete opacification of the left frontal, anterior ethmoid, and maxillary sinuses. Small volume petrous apex fluid bilaterally. CTA NECK: AORTIC ARCH: Common origin of the brachiocephalic and left common carotid arteries, a normal variant. Widely patent arch vessel origins. COMMON CAROTID ARTERIES: Mild atheromatous irregularity of both common carotid arteries without stenosis or dissection. INTERNAL CAROTID ARTERIES: Small amount of atherosclerotic plaque in the carotid bulbs without significant stenosis or dissection. VERTEBRAL ARTERIES: No significant stenosis. No dissection or occlusion. CTA HEAD: ANTERIOR CEREBRAL ARTERIES: No significant stenosis. No occlusion. No aneurysm. MIDDLE CEREBRAL ARTERIES: No significant stenosis. No occlusion. No aneurysm. POSTERIOR CEREBRAL ARTERIES: No significant stenosis. No  occlusion. No aneurysm. BASILAR ARTERY: No significant stenosis. No occlusion. No aneurysm. OTHER: SOFT TISSUES: No acute finding. No masses or lymphadenopathy. BONES: Cervical spondylosis. Focally severe facet arthrosis on the right at C3-4 and C2-3. IMPRESSION: 1. No large vessel occlusion or significant stenosis in the head or neck. 2. No acute intracranial abnormality. 3. 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 Electronically signed by: Dasie Hamburg MD 03/17/2024 07:06 PM EDT RP Workstation: [id number]   ECHOCARDIOGRAM COMPLETE BUBBLE STUDY Result Date: 03/17/2024    ECHOCARDIOGRAM REPORT   Patient Name:   AVIK LEONI Date of Exam: 03/17/2024 Medical Rec #:  [id number]       Height:       73.0 in Accession #:    [id number]      Weight:  163.4 lb Date of Birth:  Jan 17, 1966      BSA:          1.974 m Patient Age:    57 years        BP:           141/103 mmHg Patient Gender: M               HR:           67 bpm. Exam Location:  Inpatient Procedure: 2D Echo (Both Spectral and Color Flow Doppler were utilized during            procedure). Indications:     stroke  History:         Patient has prior history of Echocardiogram examinations, most                  recent 08/25/2018. CHF, CAD, HIV; Risk Factors:Current Smoker                  and Hypertension.  Sonographer:     Tinnie Barefoot RDCS Referring Phys:  [id number] DELAYNE LULLA SOLIAN Diagnosing Phys: Evalene Lunger MD  Sonographer Comments: Uncooperative due to stroke. IMPRESSIONS  1. Left ventricular ejection fraction, by estimation, is 55 to 60%. Left ventricular ejection fraction by PLAX is 54 %. The left ventricle has normal function. The left ventricle has no regional wall motion abnormalities. Left ventricular diastolic parameters are consistent with Grade I diastolic dysfunction (impaired relaxation).  2. Right ventricular systolic function is normal. The right ventricular size is normal. There is normal pulmonary artery systolic pressure.  The estimated right ventricular systolic pressure is 20.1 mmHg.  3. The mitral valve is normal in structure. No evidence of mitral valve regurgitation. No evidence of mitral stenosis.  4. The aortic valve is tricuspid. Aortic valve regurgitation is not visualized. No aortic stenosis is present.  5. The inferior vena cava is normal in size with greater than 50% respiratory variability, suggesting right atrial pressure of 3 mmHg. FINDINGS  Left Ventricle: Left ventricular ejection fraction, by estimation, is 55 to 60%. Left ventricular ejection fraction by PLAX is 54 %. The left ventricle has normal function. The left ventricle has no regional wall motion abnormalities. Strain was performed and the global longitudinal strain is indeterminate. The left ventricular internal cavity size was normal in size. There is no left ventricular hypertrophy. Left ventricular diastolic parameters are consistent with Grade I diastolic dysfunction  (impaired relaxation). Right Ventricle: The right ventricular size is normal. No increase in right ventricular wall thickness. Right ventricular systolic function is normal. There is normal pulmonary artery systolic pressure. The tricuspid regurgitant velocity is 2.07 m/s, and  with an assumed right atrial pressure of 3 mmHg, the estimated right ventricular systolic pressure is 20.1 mmHg. Left Atrium: Left atrial size was normal in size. Right Atrium: Right atrial size was normal in size. Pericardium: There is no evidence of pericardial effusion. Mitral Valve: The mitral valve is normal in structure. No evidence of mitral valve regurgitation. No evidence of mitral valve stenosis. Tricuspid Valve: The tricuspid valve is normal in structure. Tricuspid valve regurgitation is mild . No evidence of tricuspid stenosis. Aortic Valve: The aortic valve is tricuspid. Aortic valve regurgitation is not visualized. No aortic stenosis is present. Pulmonic Valve: The pulmonic valve was normal in  structure. Pulmonic valve regurgitation is not visualized. No evidence of pulmonic stenosis. Aorta: The aortic root is normal in size and structure. Venous: The inferior vena  cava is normal in size with greater than 50% respiratory variability, suggesting right atrial pressure of 3 mmHg. IAS/Shunts: No atrial level shunt detected by color flow Doppler. Agitated saline contrast was given intravenously to evaluate for intracardiac shunting. Additional Comments: 3D was performed not requiring image post processing on an independent workstation and was indeterminate.  LEFT VENTRICLE PLAX 2D LV EF:         Left            Diastology                ventricular     LV e' medial:    8.59 cm/s                ejection        LV E/e' medial:  9.0                fraction by     LV e' lateral:   10.90 cm/s                PLAX is 54      LV E/e' lateral: 7.1                %. LVIDd:         4.70 cm LVIDs:         3.40 cm LV PW:         1.10 cm LV IVS:        1.00 cm LVOT diam:     1.90 cm LV SV:         55 LV SV Index:   28 LVOT Area:     2.84 cm  RIGHT VENTRICLE             IVC RV Basal diam:  3.40 cm     IVC diam: 1.30 cm RV S prime:     14.00 cm/s TAPSE (M-mode): 2.2 cm LEFT ATRIUM             Index        RIGHT ATRIUM           Index LA diam:        2.80 cm 1.42 cm/m   RA Area:     14.20 cm LA Vol (A2C):   39.5 ml 20.01 ml/m  RA Volume:   37.90 ml  19.20 ml/m LA Vol (A4C):   42.8 ml 21.68 ml/m LA Biplane Vol: 41.9 ml 21.22 ml/m  AORTIC VALVE LVOT Vmax:   89.60 cm/s LVOT Vmean:  56.100 cm/s LVOT VTI:    0.193 m  AORTA Ao Root diam: 2.90 cm Ao Asc diam:  3.00 cm MITRAL VALVE               TRICUSPID VALVE MV Area (PHT): 3.99 cm    TR Peak grad:   17.1 mmHg MV Decel Time: 190 msec    TR Vmax:        207.00 cm/s MV E velocity: 77.00 cm/s MV A velocity: 91.20 cm/s  SHUNTS MV E/A ratio:  0.84        Systemic VTI:  0.19 m                            Systemic Diam: 1.90 cm Evalene Lunger MD Electronically signed by Evalene Lunger MD Signature Date/Time: 03/17/2024/5:05:35 PM    Final    EEG adult Result Date: 03/17/2024 Michaela Aisha SQUIBB,  MD     03/17/2024  5:06 PM History: 58 year old with intermittent episodes of aphemia EEG Duration: 23 minutes Sedation: None Patient State: Awake and drowsy Technique: This EEG was acquired with electrodes placed according to the International 10-20 electrode system (including Fp1, Fp2, F3, F4, C3, C4, P3, P4, O1, O2, T3, T4, T5, T6, A1, A2, Fz, Cz, Pz). The following electrodes were missing or displaced: none. Background: The background consists of intermixed alpha and beta activities. There is a well defined posterior dominant rhythm of 9-10 Hz that attenuates with eye opening.  During the recording he had a single episode characterized by psychomotor agitation and refusal to speak.  During the episode, there is no significant change to the underlying rhythms until the end at which point there is a suggestion of a left-sided rhythmic activity that appears artifactual in nature without any evolution.  He is moving his arm at the time, and I wonder if this is movement related.  The same pattern is seen subsequently when the patient is asymptomatic as well.  There was no definite epileptiform discharge. Photic stimulation: Physiologic driving is not performed EEG Abnormalities: No definite abnormalities Clinical Interpretation: This normal EEG is recorded in the waking and drowsy state. There was no seizure or seizure predisposition recorded on this study. Please note that lack of epileptiform activity on EEG does not preclude the possibility of epilepsy. Though there was no definite evidence of epileptic activity, if the patient continues to have episodes, could consider transfer for longer-term monitoring but at the current time I do not think that this study supports an epileptic nature to his events. Aisha Seals, MD [facility] If 7pm- 7am, please page neurology on call  as listed in AMION.  MR Brain W and Wo Contrast Result Date: 03/17/2024 CLINICAL DATA:  58 year old male with altered mental status. CHF, hypertension, TIA. EXAM: MRI HEAD WITHOUT AND WITH CONTRAST TECHNIQUE: Multiplanar, multiecho pulse sequences of the brain and surrounding structures were obtained without and with intravenous contrast. CONTRAST:  7mL GADAVIST  GADOBUTROL  1 MMOL/ML IV SOLN COMPARISON:  Head CT yesterday. FINDINGS: Brain: No restricted diffusion to suggest acute infarction. No midline shift, mass effect, evidence of mass lesion, ventriculomegaly, extra-axial collection or acute intracranial hemorrhage. Cervicomedullary junction and pituitary are within normal limits. Scattered cerebral white matter T2 and FLAIR hyperintensity, mildly advanced for age in most notable in the right centrum semiovale (series 8, image 36). No cortical encephalomalacia identified. No chronic cerebral blood products on SWI. Cerebral volume is within normal limits for age. Deep gray nuclei, brainstem and cerebellum are within normal limits for age. Vascular: Major intracranial vascular flow voids are preserved. And following contrast the major dural venous sinuses are enhancing and appear to be patent. Skull and upper cervical spine: Visualized bone marrow signal is within normal limits. Negative visible cervical spine. Sinuses/Orbits: Negative orbits. Heterogeneous complete opacification of the left frontal, anterior ethmoid, and maxillary sinuses. Periosteal thickening demonstrated on CT. Minor paranasal sinus mucosal thickening otherwise. Small volume retained secretions in the nasopharynx. Other: Trace bilateral petrous apex air cell fluid, mastoids generally well aerated. Grossly normal visible internal auditory structures. IMPRESSION: 1. No acute intracranial abnormality. 2. Mildly age advanced cerebral white matter signal changes, nonspecific but most commonly due to chronic small vessel disease. 3. Chronic  appearing left OMC obstructive pattern of paranasal sinus disease. Electronically Signed   By: VEAR Hurst M.D.   On: 03/17/2024 04:13   DG Chest 1 View Result Date: 03/16/2024 CLINICAL DATA:  Altered mental status with unbalanced gait. EXAM: CHEST  1 VIEW COMPARISON:  June 29, 2017 FINDINGS: The heart size and mediastinal contours are within normal limits. Both lungs are clear. The visualized skeletal structures are unremarkable. IMPRESSION: No active disease. Electronically Signed   By: Suzen Dials M.D.   On: 03/16/2024 23:45    EKG: Independently reviewed.  Normal sinus rhythm.  Assessment/Plan Principal Problem:   Difficulty with speech Active Problems:   HTN (hypertension)   Tobacco use   Difficulty speaking   Chronic HFrEF (heart failure with reduced ejection fraction) (HCC)   Alcohol abuse   Anemia    Difficulty speaking -      neurologist at this time want to rule out seizures and patient has been placed on continuous EEG monitoring.  Discussed with on-call neurologist Dr. Vanessa. History of chronic HFrEF with improved EF last EF measured yesterday was 55 to 60% with grade 1 diastolic dysfunction.  Over the last 2 days at [facility] patient was only on Coreg .  Which we will continue. History of CAD denies any chest pain.  Continue Coreg  statins and aspirin . Mild hypokalemia replace and recheck. Anemia macrocytic will check anemia panel.  Follow CBC. HIV on Biktarvy .  No recent CD4 count and viral load in the chart.  Will order. Diabetes mellitus type 2 last hemoglobin A1c was 5.1 yesterday.  On sliding scale coverage. Hypertension on Coreg . Tobacco abuse and alcohol use advised about quitting.  Has not been off alcohol last 3 days.  On CIWA.  Thiamine .  Since patient has concern for possible seizures on continuous EEG monitoring will need close monitoring and more than 2 midnight stay.   DVT prophylaxis: Lovenox . Code Status: Full code. Family Communication: [place]  with patient. Disposition Plan: Monitored bed. Consults called: Neurologist. Admission status: Inpatient.

## 2024-03-19 ENCOUNTER — Encounter (HOSPITAL_COMMUNITY)

## 2024-03-19 ENCOUNTER — Other Ambulatory Visit: Payer: Self-pay

## 2024-03-19 DIAGNOSIS — R29818 Other symptoms and signs involving the nervous system: Secondary | ICD-10-CM | POA: Diagnosis not present

## 2024-03-19 DIAGNOSIS — R569 Unspecified convulsions: Secondary | ICD-10-CM | POA: Diagnosis not present

## 2024-03-19 DIAGNOSIS — R479 Unspecified speech disturbances: Secondary | ICD-10-CM | POA: Diagnosis not present

## 2024-03-19 LAB — GLUCOSE, CAPILLARY
Glucose-Capillary: 129 mg/dL — ABNORMAL HIGH (ref 70–99)
Glucose-Capillary: 131 mg/dL — ABNORMAL HIGH (ref 70–99)
Glucose-Capillary: 200 mg/dL — ABNORMAL HIGH (ref 70–99)
Glucose-Capillary: 159 mg/dL — ABNORMAL HIGH (ref 70–99)

## 2024-03-19 LAB — CREATININE, SERUM
Creatinine, Ser: 1.3 mg/dL — ABNORMAL HIGH (ref 0.61–1.24)
GFR, Estimated: >60 mL/min (ref >60)

## 2024-03-19 LAB — RETICULOCYTES
Immature Retic Fract: 7.5 % (ref 2.3–15.9)
RBC.: 3.34 MIL/uL — ABNORMAL LOW (ref 4.22–5.81)
Retic Count, Absolute: 39.2 K/uL (ref 19.0–186.0)
Retic Ct Pct: 1.2 % (ref 0.4–3.1)

## 2024-03-19 LAB — CBC
HCT: 32.8 % — ABNORMAL LOW (ref 39.0–52.0)
Hemoglobin: 11.2 g/dL — ABNORMAL LOW (ref 13.0–17.0)
MCH: 33.4 pg (ref 26.0–34.0)
MCHC: 34.1 g/dL (ref 30.0–36.0)
MCV: 97.9 fL (ref 80.0–100.0)
Platelets: 324 K/uL (ref 150–400)
RBC: 3.35 MIL/uL — ABNORMAL LOW (ref 4.22–5.81)
RDW: 13 % (ref 11.5–15.5)
WBC: 4.8 K/uL (ref 4.0–10.5)
nRBC: 0 % (ref 0.0–0.2)

## 2024-03-19 LAB — IRON AND TIBC
Iron: 40 ug/dL — ABNORMAL LOW (ref 45–182)
Saturation Ratios: 11 % — ABNORMAL LOW (ref 17.9–39.5)
TIBC: 370 ug/dL (ref 250–450)
UIBC: 330 ug/dL

## 2024-03-19 LAB — TSH: TSH: 4.651 u[IU]/mL — ABNORMAL HIGH (ref 0.350–4.500)

## 2024-03-19 LAB — BASIC METABOLIC PANEL WITH GFR
Anion gap: 10 (ref 5–15)
BUN: 19 mg/dL (ref 6–20)
CO2: 23 mmol/L (ref 22–32)
Calcium: 9.1 mg/dL (ref 8.9–10.3)
Chloride: 107 mmol/L (ref 98–111)
Creatinine, Ser: 1.32 mg/dL — ABNORMAL HIGH (ref 0.61–1.24)
GFR, Estimated: >60 mL/min (ref >60)
Glucose, Bld: 118 mg/dL — ABNORMAL HIGH (ref 70–99)
Potassium: 3.9 mmol/L (ref 3.5–5.1)
Sodium: 140 mmol/L (ref 135–145)

## 2024-03-19 LAB — FOLATE: Folate: 11.7 ng/mL (ref >5.9)

## 2024-03-19 LAB — T-HELPER CELLS (CD4) COUNT (NOT AT ARMC)
CD4 % Helper T Cell: 29 % — ABNORMAL LOW (ref 33–65)
CD4 T Cell Abs: 416 /uL (ref 400–1790)

## 2024-03-19 LAB — FERRITIN: Ferritin: 24 ng/mL (ref 24–336)

## 2024-03-19 LAB — MAGNESIUM: Magnesium: 2 mg/dL (ref 1.7–2.4)

## 2024-03-19 LAB — VITAMIN B12: Vitamin B-12: 240 pg/mL (ref 180–914)

## 2024-03-19 MED ORDER — AMLODIPINE BESYLATE 5 MG PO TABS
5.0000 mg | ORAL_TABLET | Freq: Every day | ORAL | Status: DC
  Administered 2024-03-19 – 2024-03-21 (×3): 5 mg via ORAL
  Filled 2024-03-19 (×3): qty 1

## 2024-03-19 MED ORDER — ADULT MULTIVITAMIN W/MINERALS CH
1.0000 | ORAL_TABLET | Freq: Every day | ORAL | Status: DC
  Administered 2024-03-20: 1 via ORAL
  Filled 2024-03-19: qty 1

## 2024-03-19 NOTE — Progress Notes (Signed)
 PT Cancellation Note  Patient Details Name: Manuel Rosales MRN: [id number] DOB: 05/24/1966   Cancelled Treatment:    Reason Eval/Treat Not Completed: PT screened, no needs identified, will sign off (Pt was evaluated by PT at [facility] 2 days ago and deemed to be independent. PT will sign off at this time.)   Gerardo Territo 03/19/2024, 8:39 AM

## 2024-03-19 NOTE — Plan of Care (Signed)
  Problem: Education: Goal: Knowledge of General Education information will improve Description: Including pain rating scale, medication(s)/side effects and non-pharmacologic comfort measures Outcome: Progressing   Problem: Health Behavior/Discharge Planning: Goal: Ability to manage health-related needs will improve Outcome: Progressing   Problem: Clinical Measurements: Goal: Ability to maintain clinical measurements within normal limits will improve Outcome: Progressing Goal: Will remain free from infection Outcome: Progressing Goal: Diagnostic test results will improve Outcome: Progressing Goal: Respiratory complications will improve Outcome: Progressing Goal: Cardiovascular complication will be avoided Outcome: Progressing   Problem: Activity: Goal: Risk for activity intolerance will decrease Outcome: Progressing   Problem: Nutrition: Goal: Adequate nutrition will be maintained Outcome: Progressing   Problem: Coping: Goal: Level of anxiety will decrease Outcome: Progressing   Problem: Elimination: Goal: Will not experience complications related to bowel motility Outcome: Progressing Goal: Will not experience complications related to urinary retention Outcome: Progressing   Problem: Pain Managment: Goal: General experience of comfort will improve and/or be controlled Outcome: Progressing   Problem: Safety: Goal: Ability to remain free from injury will improve Outcome: Progressing   Problem: Skin Integrity: Goal: Risk for impaired skin integrity will decrease Outcome: Progressing   Problem: Fluid Volume: Goal: Ability to maintain a balanced intake and output will improve Outcome: Progressing   Problem: Metabolic: Goal: Ability to maintain appropriate glucose levels will improve Outcome: Progressing   Problem: Nutritional: Goal: Maintenance of adequate nutrition will improve Outcome: Progressing

## 2024-03-19 NOTE — Evaluation (Signed)
 Speech Language Pathology Evaluation Patient Details Name: Manuel Rosales MRN: [id number] DOB: Dec 06, 1965 Today's Date: 03/19/2024 Time: 8669-8644 SLP Time Calculation (min) (ACUTE ONLY): 25 min  Problem List:  Patient Active Problem List   Diagnosis Date Noted   Difficulty speaking 03/18/2024   Chronic HFrEF (heart failure with reduced ejection fraction) (HCC) 03/18/2024   Alcohol abuse 03/18/2024   Anemia 03/18/2024   Difficulty with speech 03/18/2024   Diabetes mellitus type 2 in nonobese (HCC) 03/18/2024   Hypokalemia 03/18/2024   Coronary artery disease 03/16/2024   Focal neurological deficit 03/16/2024   Tobacco use 02/17/2018   Hypotension 07/24/2017   Acute on chronic heart failure (HCC) 07/18/2017   HTN (hypertension) 06/18/2017   NSTEMI (non-ST elevated myocardial infarction) [facility])    CHF (congestive heart failure) (HCC) 06/08/2017   HIV disease (HCC) 06/08/2017   Past Medical History:  Past Medical History:  Diagnosis Date   Chronic systolic CHF (congestive heart failure) (HCC) 06/2017   a. TTE 11/18: EF of 20-25% , difuse hypokinesis, mild to moderate tricuspid regurgitation, and mild pulmonary hypertension   Coronary artery disease 06/2017   Non-ST elevation myocardial infarction.  Treated medically with no cardiac cath done due to active infection and renal failure.   Dermatitis    HIV (human immunodeficiency virus infection) (HCC)    Hypertension    Pulmonary hypertension (HCC)    a. TTE 11/18: EF of 20-25% , difuse hypokinesis, mild to moderate tricuspid regurgitation, and mild pulmonary hypertension w/ PASP 40 mmHg   Past Surgical History:  Past Surgical History:  Procedure Laterality Date   APPENDECTOMY     IR RADIOLOGIST EVAL & MGMT  06/19/2017   HPI:  58yo male admitted 03/18/24 with speech difficulty. MH: HIV, chronic HFrEF, improved EF, CAD, NSTEMI (2018), anemia. Presented to [facility] 03/17/24 with difficulty speaking. MRI - no acute intracranial  abnormality.   Assessment / Plan / Recommendation Clinical Impression  Pt seen at bedside for cognitive linguistic assessment. Initial visit with pt this morning was during the Bedside Swallow Assessment. At that time, pt exhibited apparent difficulty with initiation of verbalizations and frequent dysfluencies. He was visibly frustrated and slightly tearful during that session.   Upon SLP arrival this afternoon, pt stated without difficulty I didn't get no lemonade. I asked for lemonade and I ain't got it yet. Other verbal interactions were sentence length with full fluency and no obvious difficulty with initiation. Pt requested his blinds be lowered, and recalled breakfast items from this morning without verbal hesitation or dysfluency. Pt is Ox4. He is able to follow multistep verbal commands and answer complex yes/no questions. He was able to name 13 animals in 60 seconds (n=15+). There is no dysarthria or apraxia. Pt was able to indicate fluently that he needed to go to the bathroom, but required cues to stay in bed until assistance arrived (EEG leads in place).   When asked if his communication difficulties were variable, improving, or worsening, pt responses changed abruptly to 1 word answers with significant delay. SLP will follow up once EEG results are available to provide education. If difficulty with communication continues, recommend referral to outpatient or home health speech therapy at discharge.    SLP Assessment  SLP Recommendation/Assessment: Patient needs continued Speech Language Pathology Services SLP Visit Diagnosis: Cognitive communication deficit (R41.841)     Assistance Recommended at Discharge  Other (comment) (TBD)  Functional Status Assessment Patient has had a recent decline in their functional status and demonstrates the ability  to make significant improvements in function in a reasonable and predictable amount of time.  Frequency and Duration min 1 x/week  1 week       SLP Evaluation Cognition  Overall Cognitive Status: No family/caregiver present to determine baseline cognitive functioning Arousal/Alertness: Awake/alert Orientation Level: Oriented X4       Comprehension  Auditory Comprehension Overall Auditory Comprehension: Appears within functional limits for tasks assessed    Expression Expression Primary Mode of Expression: Verbal Verbal Expression Overall Verbal Expression: Impaired Initiation: Impaired Level of Generative/Spontaneous Verbalization: Sentence;Conversation Repetition: No impairment Naming: No impairment Pragmatics: No impairment Written Expression Dominant Hand: Right   Oral / Motor  Oral Motor/Sensory Function Overall Oral Motor/Sensory Function: Within functional limits Motor Speech Overall Motor Speech: Appears within functional limits for tasks assessed Respiration: Within functional limits Resonance: Within functional limits Articulation: Within functional limitis Intelligibility: Intelligible Motor Planning: Within functional limits           Keisuke Hollabaugh B. Dory, MSP, CCC-SLP Speech Language Pathologist Office: [phone]  Dory Caprice Daring 03/19/2024, 2:04 PM

## 2024-03-19 NOTE — Plan of Care (Signed)

## 2024-03-19 NOTE — Progress Notes (Signed)
 LTM EEG hooked up and recording with CT compatible leads. [facility] is monitoring.

## 2024-03-19 NOTE — Consult Note (Signed)
 NEUROLOGY CONSULT NOTE   Date of service: March 19, 2024 Patient Name: Manuel Rosales MRN:  [id number] DOB:  1966-01-14 Chief Complaint: Episodes of aphemia Requesting Provider: Dennise Lavada POUR, MD  History of Present Illness  JONN CHAIKIN is a 58 y.o. male with hx of HIV, CHF, CAD and anemia who initially presented to [facility] with difficulty speaking.  Starting on Wednesday, patient had episodes during which he could not speak the words that he wanted to speak but could understand what was being said to him during those times. He was able to communicate through alternate ways such as gesturing and writing.  MRI was negative for acute infarct, and CT angiogram demonstrated no hemodynamically significant stenosis.  Episodes are suspicious for seizure activity. Although patient does not have a history of seizures, he does have 2 family members with seizures.  He has no history of brain surgery, meningitis, encephalitis or traumatic brain injury.  He had no problems with his birth or early development.  He complains of no other recent symptoms.   ROS  Comprehensive ROS performed and pertinent positives documented in HPI   Past History   Past Medical History:  Diagnosis Date   Chronic systolic CHF (congestive heart failure) (HCC) 06/2017   a. TTE 11/18: EF of 20-25% , difuse hypokinesis, mild to moderate tricuspid regurgitation, and mild pulmonary hypertension   Coronary artery disease 06/2017   Non-ST elevation myocardial infarction.  Treated medically with no cardiac cath done due to active infection and renal failure.   Dermatitis    HIV (human immunodeficiency virus infection) (HCC)    Hypertension    Pulmonary hypertension (HCC)    a. TTE 11/18: EF of 20-25% , difuse hypokinesis, mild to moderate tricuspid regurgitation, and mild pulmonary hypertension w/ PASP 40 mmHg    Past Surgical History:  Procedure Laterality Date   APPENDECTOMY     IR RADIOLOGIST EVAL & MGMT  06/19/2017     Family History: Family History  Problem Relation Age of Onset   Cerebral aneurysm Brother        died   Hypertension Mother    Diabetes Mother    Heart attack Father     Social History  reports that he has been smoking cigarettes. He has never used smokeless tobacco. He reports current alcohol use of about 2.0 standard drinks of alcohol per week. He reports that he does not currently use drugs after having used the following drugs: Marijuana.  No Known Allergies  Medications   Current Facility-Administered Medications:    aspirin  EC tablet 81 mg, 81 mg, Oral, Daily, Kakrakandy, Arshad N, MD   atorvastatin  (LIPITOR) tablet 40 mg, 40 mg, Oral, Daily, Kakrakandy, Arshad N, MD   bictegravir-emtricitabine -tenofovir  AF (BIKTARVY ) 50-200-25 MG per tablet 1 tablet, 1 tablet, Oral, Daily, Franky Redia SAILOR, MD   carvedilol  (COREG ) tablet 6.25 mg, 6.25 mg, Oral, BID WC, Kakrakandy, Arshad N, MD   cyanocobalamin  (VITAMIN B12) tablet 1,000 mcg, 1,000 mcg, Oral, Daily, Kakrakandy, Arshad N, MD   enoxaparin  (LOVENOX ) injection 40 mg, 40 mg, Subcutaneous, QHS, Franky Redia SAILOR, MD, 40 mg at 03/18/24 2345   folic acid  (FOLVITE ) tablet 1 mg, 1 mg, Oral, Daily, Kakrakandy, Arshad N, MD   insulin  aspart (novoLOG ) injection 0-9 Units, 0-9 Units, Subcutaneous, TID WC, Kakrakandy, Arshad N, MD   multivitamin with minerals tablet 1 tablet, 1 tablet, Oral, Daily, Franky Redia SAILOR, MD   thiamine  (VITAMIN B1) tablet 100 mg, 100 mg, Oral, Daily **OR**  thiamine  (VITAMIN B1) injection 100 mg, 100 mg, Intravenous, Daily, Franky Redia SAILOR, MD  Vitals   Vitals:   03/19/24 0000 03/19/24 0400 03/19/24 0500 03/19/24 0838  BP: 137/89 114/61  122/87  Pulse: 85 (!) 57  70  Resp: 16 20 20 18   Temp: 98 F (36.7 C) 98.1 F (36.7 C)  97.7 F (36.5 C)  TempSrc: Oral Oral  Oral  SpO2: 96% 99%    Height:        Body mass index is 19.95 kg/m.   Physical Exam   Constitutional: Appears  well-developed and well-nourished.  Psych: Affect appropriate to situation.  Eyes: No scleral injection.  HENT: No OP obstruction.  Head: Normocephalic.  Cardiovascular: Normal rate and regular rhythm.  Respiratory: Effort normal, non-labored breathing.  Skin: WDI.   Neurologic Examination    NEURO:  Mental Status: AA&Ox3  Speech/Language: speech is without dysarthria or aphasia.  Naming, repetition, fluency, and comprehension intact. Cranial Nerves:  II: PERRL.  III, IV, VI: EOMI. Eyelids elevate symmetrically.  V: Sensation is intact to light touch and symmetrical to face.  VII: Smile is symmetrical. Able to puff cheeks and raise eyebrows.  VIII: hearing intact to voice. IX, X:  Phonation is normal.  KP:Dynloizm shrug 5/5. XII: tongue is midline without fasciculations. Motor: Able to move all 4 extremities with good antigravity strength Tone is normal and bulk is normal Sensation- Intact to light touch bilaterally.  Coordination: FTN intact bilaterally Gait- deferred  During exam, patient had an episode where he looked down towards his lips but was unable to speak words.  He could not understand what was being said to him during that time and could not gesture.  Speech recovered quickly, but in a halting pattern.  Labs/Imaging/Neurodiagnostic studies   CBC:  Recent Labs  Lab 04-02-2024 2205 03/18/24 0903 03/19/24 0556  WBC 7.3 6.3 4.8  NEUTROABS 5.3 4.2  --   HGB 11.6* 11.4* 11.2*  HCT 36.1* 34.5* 32.8*  MCV 104* 98.0 97.9  PLT 353 321 324   Basic Metabolic Panel:  Lab Results  Component Value Date   NA 140 03/19/2024   K 3.9 03/19/2024   CO2 23 03/19/2024   GLUCOSE 118 (H) 03/19/2024   BUN 19 03/19/2024   CREATININE 1.32 (H) 03/19/2024   CALCIUM  9.1 03/19/2024   GFRNONAA >60 03/19/2024   GFRAA 39 (L) 10/03/2017   Lipid Panel:  Lab Results  Component Value Date   LDLCALC 40 03/17/2024   HgbA1c:  Lab Results  Component Value Date   HGBA1C 5.1  Apr 02, 2024   Urine Drug Screen: No results found for: LABOPIA, COCAINSCRNUR, LABBENZ, AMPHETMU, THCU, LABBARB  Alcohol Level     Component Value Date/Time   ETH <15 04/02/2024 2200   INR  Lab Results  Component Value Date   INR 1.0 04/02/24   APTT  Lab Results  Component Value Date   APTT 23 (L) April 02, 2024    CT Head without contrast(Personally reviewed): No acute abnormality  CT angio Head and Neck with contrast(Personally reviewed): No LVO or hemodynamically significant stenosis  MRI Brain(Personally reviewed): No acute abnormality  Neurodiagnostics Continuous EEG:  pending  ASSESSMENT   BEVIN MAYALL is a 58 y.o. male with hx of HIV, CHF, CAD and anemia who presents with recurrent episodes of aphemia.  Episode started on Wednesday, and during the episodes, patient is able to understand what is being said to him and can communicate through alternate means.  MRI brain was  negative for acute stroke, and episodes are suspicious for seizure activity.  Patient does have 2 immediate family members with seizures, so he is at high risk for seizure activity himself.  Will connect to LTM EEG for spell capture.  RECOMMENDATIONS  - LTM EEG - Will hold off on AEDs until LTM captures a spell - Neurology will continue to follow ______________________________________________________________________  Patient seen by NP. Signed, Cortney E Everitt Clint Kill, NP [facility]   Electronically signed: Dr. Pam Vanalstine

## 2024-03-19 NOTE — Evaluation (Signed)
 Clinical/Bedside Swallow Evaluation Patient Details  Name: Manuel Rosales MRN: [id number] Date of Birth: 06-20-1966  Today's Date: 03/19/2024 Time: SLP Start Time (ACUTE ONLY): 0935 SLP Stop Time (ACUTE ONLY): 0947 SLP Time Calculation (min) (ACUTE ONLY): 12 min  Past Medical History:  Past Medical History:  Diagnosis Date   Chronic systolic CHF (congestive heart failure) (HCC) 06/2017   a. TTE 11/18: EF of 20-25% , difuse hypokinesis, mild to moderate tricuspid regurgitation, and mild pulmonary hypertension   Coronary artery disease 06/2017   Non-ST elevation myocardial infarction.  Treated medically with no cardiac cath done due to active infection and renal failure.   Dermatitis    HIV (human immunodeficiency virus infection) (HCC)    Hypertension    Pulmonary hypertension (HCC)    a. TTE 11/18: EF of 20-25% , difuse hypokinesis, mild to moderate tricuspid regurgitation, and mild pulmonary hypertension w/ PASP 40 mmHg   Past Surgical History:  Past Surgical History:  Procedure Laterality Date   APPENDECTOMY     IR RADIOLOGIST EVAL & MGMT  06/19/2017   HPI:  58yo male admitted 03/18/24 with speech difficulty. MH: HIV, chronic HFrEF, improved EF, CAD, NSTEMI (2018), anemia. Presented to [facility] 03/17/24 with difficulty speaking. MRI - no acute intracranial abnormality.    Assessment / Plan / Recommendation  Clinical Impression  Pt presents with functional oropharyngeal swallow, as best can be determined at bedside. CN exam unremarkable. He is missing dentition, but reports tolerance of regular solids and thin liquids without difficulty. Pt was observed self feeding items from regular breakfast tray. Adequate and timely oral prep and clearing without overt s/s aspiration across textures. RN reports no difficulty observed with PO or medications.   Recommend continuing with current diet. No further ST intervention for dysphagia is recommended at this time, however, cognitive linguistic  evaluation may be beneficial to assesss pt's difficulty effective communication. RN/MD informed.  SLP Visit Diagnosis: Dysphagia, unspecified (R13.10)    Aspiration Risk  Mild aspiration risk    Diet Recommendation Regular;Thin liquid    Liquid Administration via: Straw;Cup Medication Administration: Other (Comment) (as tolerated - per pt preference) Supervision: Patient able to self feed Compensations: Slow rate;Small sips/bites Postural Changes: Seated upright at 90 degrees    Other  Recommendations Oral Care Recommendations: Oral care BID     Assistance Recommended at Discharge  none  Functional Status Assessment Patient has not had a recent decline in their functional status      Prognosis Prognosis for improved oropharyngeal function: Good      Swallow Study   General Date of Onset: 03/18/24 HPI: 58yo male admitted 03/18/24 with speech difficulty. MH: HIV, chronic HFrEF, improved EF, CAD, NSTEMI (2018), anemia. Presented to [facility] 03/17/24 with difficulty speaking. MRI - no acute intracranial abnormality. Type of Study: Bedside Swallow Evaluation Previous Swallow Assessment: none Diet Prior to this Study: Regular;Thin liquids (Level 0) Temperature Spikes Noted: No Respiratory Status: Room air History of Recent Intubation: No Behavior/Cognition: Pleasant mood;Cooperative;Alert Oral Cavity Assessment: Within Functional Limits Oral Care Completed by SLP: No (Pt eating breakfast upon arrival of SLP) Oral Cavity - Dentition: Poor condition;Missing dentition Vision: Functional for self-feeding Self-Feeding Abilities: Able to feed self Patient Positioning: Upright in bed Baseline Vocal Quality: Normal Volitional Cough: Strong Volitional Swallow: Able to elicit    Oral/Motor/Sensory Function Overall Oral Motor/Sensory Function: Within functional limits   Ice Chips Ice chips: Not tested   Thin Liquid Thin Liquid: Within functional limits Presentation: Self Fed;Cup  Nectar  Thick Nectar Thick Liquid: Not tested   Honey Thick Honey Thick Liquid: Not tested   Puree Puree: Within functional limits Presentation: Spoon;Self Fed   Solid     Solid: Within functional limits Presentation: Self Fed     [name] B. Dory, MSP, CCC-SLP Speech Language Pathologist Office: [phone]  Dory Caprice Daring 03/19/2024,10:25 AM

## 2024-03-19 NOTE — Progress Notes (Addendum)
 PROGRESS NOTE                                                                                                                                                                                                             Patient Demographics:    Manuel Rosales, is a 58 y.o. male, DOB - Sep 24, 1965, FMW:[id number]  Outpatient Primary MD for the patient is [facility]    LOS - 1  Admit date - 03/18/2024    No chief complaint on file.      Brief Narrative (HPI from H&P)   58 y.o. male with history of HIV, chronic HFrEF with improved EF, CAD, anemia, had presented to the ER at [facility] with complaints of difficulty speaking.  Patient was admitted to [facility] with concern for possible seizures versus TIA.  MRI brain and CT angiogram head and neck were unremarkable.  Given the intermittent episodes of patient having difficulty bringing out words neurologist wanted patient to be having continuous EEG monitoring and was transferred to [facility].     Subjective:    D[place] Silversmith today has, No headache, No chest pain, No abdominal pain - No Nausea, No new weakness tingling or numbness, no SOB   Assessment  & Plan :    Difficulty speaking - question underlying seizure activity.  Patient has no headache or focal deficits, MRI brain, CTA head and neck and spot EEG stable.  Await LTM EEG, PT OT and speech consult, further management per neurology. History of chronic HFrEF with improved EF last EF measured yesterday was 55 to 60% with grade 1 diastolic dysfunction.  Over the last 2 days at [facility] patient was only on Coreg .  Which we will continue. History of CAD denies any chest pain.  Continue Coreg  statins and aspirin . Mild hypokalemia replace and recheck. Anemia macrocytic will check anemia panel.  Follow CBC. HIV on Biktarvy .  No recent CD4 count and viral load in the chart.  Will order. Diabetes mellitus type 2 last  hemoglobin A1c was 5.1 yesterday.  On sliding scale coverage. Hypertension on Coreg . Tobacco abuse and alcohol use advised about quitting.  Has not been off alcohol last 3 days.  On CIWA.  Thiamine . CKD 3A.  Baseline creatinine around 1.3.  Monitor.    Lab Results  Component Value Date   HGBA1C  5.1 03/16/2024   CBG (last 3)  No results for input(s): GLUCAP in the last 72 hours.      Condition - Fair  Family Communication  :  None  Code Status :  Full  Consults  :  Neuro  PUD Prophylaxis :     Procedures  :     MRI -   1. No acute intracranial abnormality. 2. Mildly age advanced cerebral white matter signal changes, nonspecific but most commonly due to chronic small vessel disease. 3. 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  EEG -  This normal EEG is recorded in the waking and drowsy state. There was no seizure or seizure predisposition recorded on this study. Please note that lack of epileptiform activity on EEG does not preclude the possibility of epilepsy. Though there was no definite evidence of epileptic activity, if the patient continues to have episodes, could consider transfer for longer-term monitoring but at the current time I do not think that this study supports an epileptic nature to his events.  TTE - 1. Left ventricular ejection fraction, by estimation, is 55 to 60%. Left ventricular ejection fraction by PLAX is 54 %. The left ventricle has normal function. The left ventricle has no regional wall motion abnormalities. Left ventricular diastolic parameters are consistent with Grade I diastolic dysfunction (impaired relaxation).  2. Right ventricular systolic function is normal. The right ventricular size is normal. There is normal pulmonary artery systolic pressure. The estimated right ventricular systolic pressure is 20.1 mmHg.  3. The mitral valve is normal in structure. No evidence of mitral valve regurgitation. No evidence of  mitral stenosis.  4. The aortic valve is tricuspid. Aortic valve regurgitation is not visualized. No aortic stenosis is present.  5. The inferior vena cava is normal in size with greater than 50% respiratory variability, suggesting right atrial pressure of 3 mmHg.  CTA - 1. No large vessel occlusion or significant stenosis in the head or neck. 2. No acute intracranial abnormality. 3. 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       Disposition Plan  :    Status is: Inpatient  DVT Prophylaxis  :    enoxaparin  (LOVENOX ) injection 40 mg Start: 03/18/24 2245    Lab Results  Component Value Date   PLT 324 03/19/2024    Diet :  Diet Order             Diet heart healthy/carb modified Room service appropriate? Yes; Fluid consistency: Thin  Diet effective now                    Inpatient Medications  Scheduled Meds:  aspirin  EC  81 mg Oral Daily   atorvastatin   40 mg Oral Daily   bictegravir-emtricitabine -tenofovir  AF  1 tablet Oral Daily   carvedilol   6.25 mg Oral BID WC   cyanocobalamin   1,000 mcg Oral Daily   enoxaparin  (LOVENOX ) injection  40 mg Subcutaneous QHS   folic acid   1 mg Oral Daily   insulin  aspart  0-9 Units Subcutaneous TID WC   multivitamin with minerals  1 tablet Oral Daily   thiamine   100 mg Oral Daily   Or   thiamine   100 mg Intravenous Daily   Continuous Infusions: PRN Meds:.  Antibiotics  :    Anti-infectives (From admission, onward)    Start     Dose/Rate Route Frequency Ordered Stop   03/19/24 0800  bictegravir-emtricitabine -tenofovir  AF (BIKTARVY ) 50-200-25 MG per  tablet 1 tablet        1 tablet Oral Daily 03/18/24 2232           Objective:   Vitals:   03/18/24 2100 03/19/24 0000 03/19/24 0400 03/19/24 0500  BP:  137/89 114/61   Pulse:  85 (!) 57   Resp:  16 20 20   Temp:  98 F (36.7 C) 98.1 F (36.7 C)   TempSrc:  Oral Oral   SpO2:  96% 99%   Height: 6' 1 (1.854 m)       Wt Readings from Last 3 Encounters:   03/17/24 68.6 kg  10/30/23 74.1 kg  02/26/23 72.6 kg     Intake/Output Summary (Last 24 hours) at 03/19/2024 9187 Last data filed at 03/18/2024 2300 Gross per 24 hour  Intake 240 ml  Output --  Net 240 ml     Physical Exam  Awake Alert, stuttering speech [place].AT,PERRAL Supple Neck, No JVD,   Symmetrical Chest wall movement, Good air movement bilaterally, CTAB RRR,No Gallops,Rubs or new Murmurs,  +ve B.Sounds, Abd Soft, No tenderness,   No Cyanosis, Clubbing or edema       Data Review:    Recent Labs  Lab 03/16/24 2200 03/16/24 2205 03/18/24 0903 03/19/24 0556  WBC 7.8 7.3 6.3 4.8  HGB 11.8* 11.6* 11.4* 11.2*  HCT 35.8* 36.1* 34.5* 32.8*  PLT 366 353 321 324  MCV 99.7 104* 98.0 97.9  MCH 32.9 33.5* 32.4 33.4  MCHC 33.0 32.1 33.0 34.1  RDW 13.3 12.7 13.0 13.0  LYMPHSABS 1.1 1.3 1.1  --   MONOABS 0.5  --  0.6  --   EOSABS 0.0 0.0 0.2  --   BASOSABS 0.1 0.1 0.1  --     Recent Labs  Lab 03/16/24 2200 03/16/24 2204 03/18/24 0903 03/19/24 0556 03/19/24 0557  NA 141  --  140  --  140  K 3.3*  --  3.4*  --  3.9  CL 104  --  102  --  107  CO2 27  --  28  --  23  ANIONGAP 10  --  10  --  10  GLUCOSE 148*  --  123*  --  118*  BUN 13  --  14  --  19  CREATININE 1.21  --  1.33* 1.30* 1.32*  AST 43*  --   --   --   --   ALT 18  --   --   --   --   ALKPHOS 53  --   --   --   --   BILITOT 0.9  --   --   --   --   ALBUMIN 3.9  --   --   --   --   INR 1.0  --   --   --   --   HGBA1C  --  5.1  --   --   --   MG  --   --  2.0  --  2.0  PHOS  --   --  4.2  --   --   CALCIUM  9.2  --  9.0  --  9.1      Recent Labs  Lab 03/16/24 2200 03/16/24 2204 03/18/24 0903 03/19/24 0557  INR 1.0  --   --   --   HGBA1C  --  5.1  --   --   MG  --   --  2.0 2.0  CALCIUM  9.2  --  9.0 9.1    --------------------------------------------------------------------------------------------------------------- Lab Results  Component Value Date   CHOL 120 03/17/2024   HDL 59  03/17/2024   LDLCALC 40 03/17/2024   TRIG 107 03/17/2024   CHOLHDL 2.0 03/17/2024    Lab Results  Component Value Date   HGBA1C 5.1 03/16/2024   No results for input(s): TSH, T4TOTAL, FREET4, T3FREE, THYROIDAB in the last 72 hours. Recent Labs    03/18/24 0903 03/19/24 0556 03/19/24 0557  VITAMINB12 225 240  --   FOLATE 17.6  --  11.7  FERRITIN  --  24  --   TIBC 371 370  --   IRON 81 40*  --    ------------------------------------------------------------------------------------------------------------------ Cardiac Enzymes No results for input(s): CKMB, TROPONINI, MYOGLOBIN in the last 168 hours.  Invalid input(s): CK  Micro Results No results found for this or any previous visit (from the past 240 hours).  Radiology Report CT ANGIO HEAD NECK W WO CM Result Date: 03/17/2024 EXAM: CTA Head and Neck with Intravenous Contrast. CT Head without Contrast. CLINICAL HISTORY: Stroke, follow up. Patient was brought in because of intermittent episodes of word finding difficulty on balance gait and slow response to verbal commands. TECHNIQUE: Axial CTA images of the head and neck performed with intravenous contrast. MIP reconstructed images were created and reviewed. Axial computed tomography images of the head/brain performed without intravenous contrast. Note: Per PQRS, the description of internal carotid artery percent stenosis, including 0 percent or normal exam, is based on [place] American Symptomatic Carotid Endarterectomy Trial (NASCET) criteria. Dose reduction technique was used including one or more of the following: automated exposure control, adjustment of mA and kV according to patient size, and/or iterative reconstruction. CONTRAST: 75 ml omnipaque  350 COMPARISON: Head CT 03/16/2024 and MRI 03/17/2024 FINDINGS: CT HEAD: BRAIN: Scattered small hypodensities in the cerebral white matter, nonspecific but compatible with mild chronic small vessel ischemic disease.  Mild generalized cerebral atrophy. No evidence of an acute infarct, intracranial hemorrhage, mass, midline shift, or extra-axial fluid collection. VENTRICLES: No hydrocephalus. ORBITS: The orbits are unremarkable. SINUSES AND MASTOIDS: Chronic sinusitis with complete opacification of the left frontal, anterior ethmoid, and maxillary sinuses. Small volume petrous apex fluid bilaterally. CTA NECK: AORTIC ARCH: Common origin of the brachiocephalic and left common carotid arteries, a normal variant. Widely patent arch vessel origins. COMMON CAROTID ARTERIES: Mild atheromatous irregularity of both common carotid arteries without stenosis or dissection. INTERNAL CAROTID ARTERIES: Small amount of atherosclerotic plaque in the carotid bulbs without significant stenosis or dissection. VERTEBRAL ARTERIES: No significant stenosis. No dissection or occlusion. CTA HEAD: ANTERIOR CEREBRAL ARTERIES: No significant stenosis. No occlusion. No aneurysm. MIDDLE CEREBRAL ARTERIES: No significant stenosis. No occlusion. No aneurysm. POSTERIOR CEREBRAL ARTERIES: No significant stenosis. No occlusion. No aneurysm. BASILAR ARTERY: No significant stenosis. No occlusion. No aneurysm. OTHER: SOFT TISSUES: No acute finding. No masses or lymphadenopathy. BONES: Cervical spondylosis. Focally severe facet arthrosis on the right at C3-4 and C2-3. IMPRESSION: 1. No large vessel occlusion or significant stenosis in the head or neck. 2. No acute intracranial abnormality. 3. 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 Electronically signed by: Dasie Hamburg MD 03/17/2024 07:06 PM EDT RP Workstation: [id number]   ECHOCARDIOGRAM COMPLETE BUBBLE STUDY Result Date: 03/17/2024    ECHOCARDIOGRAM REPORT   Patient Name:   GORDON VANDUNK Date of Exam: 03/17/2024 Medical Rec #:  [id number]       Height:       73.0 in Accession #:    [id number]  Weight:       163.4 lb Date of Birth:  1966/04/28      BSA:          1.974 m Patient Age:    57 years         BP:           141/103 mmHg Patient Gender: M               HR:           67 bpm. Exam Location:  Inpatient Procedure: 2D Echo (Both Spectral and Color Flow Doppler were utilized during            procedure). Indications:     stroke  History:         Patient has prior history of Echocardiogram examinations, most                  recent 08/25/2018. CHF, CAD, HIV; Risk Factors:Current Smoker                  and Hypertension.  Sonographer:     Tinnie Barefoot RDCS Referring Phys:  [id number] DELAYNE LULLA SOLIAN Diagnosing Phys: Evalene Lunger MD  Sonographer Comments: Uncooperative due to stroke. IMPRESSIONS  1. Left ventricular ejection fraction, by estimation, is 55 to 60%. Left ventricular ejection fraction by PLAX is 54 %. The left ventricle has normal function. The left ventricle has no regional wall motion abnormalities. Left ventricular diastolic parameters are consistent with Grade I diastolic dysfunction (impaired relaxation).  2. Right ventricular systolic function is normal. The right ventricular size is normal. There is normal pulmonary artery systolic pressure. The estimated right ventricular systolic pressure is 20.1 mmHg.  3. The mitral valve is normal in structure. No evidence of mitral valve regurgitation. No evidence of mitral stenosis.  4. The aortic valve is tricuspid. Aortic valve regurgitation is not visualized. No aortic stenosis is present.  5. The inferior vena cava is normal in size with greater than 50% respiratory variability, suggesting right atrial pressure of 3 mmHg. FINDINGS  Left Ventricle: Left ventricular ejection fraction, by estimation, is 55 to 60%. Left ventricular ejection fraction by PLAX is 54 %. The left ventricle has normal function. The left ventricle has no regional wall motion abnormalities. Strain was performed and the global longitudinal strain is indeterminate. The left ventricular internal cavity size was normal in size. There is no left ventricular hypertrophy. Left  ventricular diastolic parameters are consistent with Grade I diastolic dysfunction  (impaired relaxation). Right Ventricle: The right ventricular size is normal. No increase in right ventricular wall thickness. Right ventricular systolic function is normal. There is normal pulmonary artery systolic pressure. The tricuspid regurgitant velocity is 2.07 m/s, and  with an assumed right atrial pressure of 3 mmHg, the estimated right ventricular systolic pressure is 20.1 mmHg. Left Atrium: Left atrial size was normal in size. Right Atrium: Right atrial size was normal in size. Pericardium: There is no evidence of pericardial effusion. Mitral Valve: The mitral valve is normal in structure. No evidence of mitral valve regurgitation. No evidence of mitral valve stenosis. Tricuspid Valve: The tricuspid valve is normal in structure. Tricuspid valve regurgitation is mild . No evidence of tricuspid stenosis. Aortic Valve: The aortic valve is tricuspid. Aortic valve regurgitation is not visualized. No aortic stenosis is present. Pulmonic Valve: The pulmonic valve was normal in structure. Pulmonic valve regurgitation is not visualized. No evidence of pulmonic stenosis. Aorta: The aortic root is normal in  size and structure. Venous: The inferior vena cava is normal in size with greater than 50% respiratory variability, suggesting right atrial pressure of 3 mmHg. IAS/Shunts: No atrial level shunt detected by color flow Doppler. Agitated saline contrast was given intravenously to evaluate for intracardiac shunting. Additional Comments: 3D was performed not requiring image post processing on an independent workstation and was indeterminate.  LEFT VENTRICLE PLAX 2D LV EF:         Left            Diastology                ventricular     LV e' medial:    8.59 cm/s                ejection        LV E/e' medial:  9.0                fraction by     LV e' lateral:   10.90 cm/s                PLAX is 54      LV E/e' lateral: 7.1                 %. LVIDd:         4.70 cm LVIDs:         3.40 cm LV PW:         1.10 cm LV IVS:        1.00 cm LVOT diam:     1.90 cm LV SV:         55 LV SV Index:   28 LVOT Area:     2.84 cm  RIGHT VENTRICLE             IVC RV Basal diam:  3.40 cm     IVC diam: 1.30 cm RV S prime:     14.00 cm/s TAPSE (M-mode): 2.2 cm LEFT ATRIUM             Index        RIGHT ATRIUM           Index LA diam:        2.80 cm 1.42 cm/m   RA Area:     14.20 cm LA Vol (A2C):   39.5 ml 20.01 ml/m  RA Volume:   37.90 ml  19.20 ml/m LA Vol (A4C):   42.8 ml 21.68 ml/m LA Biplane Vol: 41.9 ml 21.22 ml/m  AORTIC VALVE LVOT Vmax:   89.60 cm/s LVOT Vmean:  56.100 cm/s LVOT VTI:    0.193 m  AORTA Ao Root diam: 2.90 cm Ao Asc diam:  3.00 cm MITRAL VALVE               TRICUSPID VALVE MV Area (PHT): 3.99 cm    TR Peak grad:   17.1 mmHg MV Decel Time: 190 msec    TR Vmax:        207.00 cm/s MV E velocity: 77.00 cm/s MV A velocity: 91.20 cm/s  SHUNTS MV E/A ratio:  0.84        Systemic VTI:  0.19 m                            Systemic Diam: 1.90 cm Evalene Lunger MD Electronically signed by Evalene Lunger MD Signature Date/Time: 03/17/2024/5:05:35 PM    Final    EEG  adult Result Date: 03/17/2024 Michaela Aisha SQUIBB, MD     03/17/2024  5:06 PM History: 58 year old with intermittent episodes of aphemia EEG Duration: 23 minutes Sedation: None Patient State: Awake and drowsy Technique: This EEG was acquired with electrodes placed according to the International 10-20 electrode system (including Fp1, Fp2, F3, F4, C3, C4, P3, P4, O1, O2, T3, T4, T5, T6, A1, A2, Fz, Cz, Pz). The following electrodes were missing or displaced: none. Background: The background consists of intermixed alpha and beta activities. There is a well defined posterior dominant rhythm of 9-10 Hz that attenuates with eye opening.  During the recording he had a single episode characterized by psychomotor agitation and refusal to speak.  During the episode, there is no significant change to  the underlying rhythms until the end at which point there is a suggestion of a left-sided rhythmic activity that appears artifactual in nature without any evolution.  He is moving his arm at the time, and I wonder if this is movement related.  The same pattern is seen subsequently when the patient is asymptomatic as well.  There was no definite epileptiform discharge. Photic stimulation: Physiologic driving is not performed EEG Abnormalities: No definite abnormalities Clinical Interpretation: This normal EEG is recorded in the waking and drowsy state. There was no seizure or seizure predisposition recorded on this study. Please note that lack of epileptiform activity on EEG does not preclude the possibility of epilepsy. Though there was no definite evidence of epileptic activity, if the patient continues to have episodes, could consider transfer for longer-term monitoring but at the current time I do not think that this study supports an epileptic nature to his events. Aisha Michaela, MD [facility] If 7pm- 7am, please page neurology on call as listed in AMION.    Signature  -   Lavada Stank M.D on 03/19/2024 at 8:12 AM   -  To page go to [email or web address]

## 2024-03-20 ENCOUNTER — Inpatient Hospital Stay (HOSPITAL_COMMUNITY)

## 2024-03-20 DIAGNOSIS — R479 Unspecified speech disturbances: Secondary | ICD-10-CM | POA: Diagnosis not present

## 2024-03-20 DIAGNOSIS — F4320 Adjustment disorder, unspecified: Secondary | ICD-10-CM

## 2024-03-20 DIAGNOSIS — R29818 Other symptoms and signs involving the nervous system: Secondary | ICD-10-CM | POA: Diagnosis not present

## 2024-03-20 DIAGNOSIS — R569 Unspecified convulsions: Secondary | ICD-10-CM

## 2024-03-20 LAB — HIV-1 RNA QUANT-NO REFLEX-BLD
HIV 1 RNA Quant: <20 {copies}/mL
LOG10 HIV-1 RNA: UNDETERMINED {Log_copies}/mL

## 2024-03-20 LAB — BASIC METABOLIC PANEL WITH GFR
Anion gap: 10 (ref 5–15)
BUN: 18 mg/dL (ref 6–20)
CO2: 23 mmol/L (ref 22–32)
Calcium: 9 mg/dL (ref 8.9–10.3)
Chloride: 105 mmol/L (ref 98–111)
Creatinine, Ser: 1.14 mg/dL (ref 0.61–1.24)
GFR, Estimated: >60 mL/min (ref >60)
Glucose, Bld: 127 mg/dL — ABNORMAL HIGH (ref 70–99)
Potassium: 3.7 mmol/L (ref 3.5–5.1)
Sodium: 138 mmol/L (ref 135–145)

## 2024-03-20 LAB — GLUCOSE, CAPILLARY
Glucose-Capillary: 112 mg/dL — ABNORMAL HIGH (ref 70–99)
Glucose-Capillary: 116 mg/dL — ABNORMAL HIGH (ref 70–99)
Glucose-Capillary: 130 mg/dL — ABNORMAL HIGH (ref 70–99)
Glucose-Capillary: 143 mg/dL — ABNORMAL HIGH (ref 70–99)
Glucose-Capillary: 155 mg/dL — ABNORMAL HIGH (ref 70–99)
Glucose-Capillary: 205 mg/dL — ABNORMAL HIGH (ref 70–99)
Glucose-Capillary: 224 mg/dL — ABNORMAL HIGH (ref 70–99)

## 2024-03-20 LAB — MAGNESIUM: Magnesium: 2 mg/dL (ref 1.7–2.4)

## 2024-03-20 NOTE — Progress Notes (Signed)
 No EEG Machines available for LTM at this time.

## 2024-03-20 NOTE — Progress Notes (Signed)
 Sister, Manuel Rosales called for update and provided.  Patient is resting in bed and in no acute distress.   Will continue to monitor.

## 2024-03-20 NOTE — Progress Notes (Signed)
 LTM maint complete - no skin breakdown under:  Fp1,Fp2

## 2024-03-20 NOTE — Progress Notes (Signed)
 PROGRESS NOTE                                                                                                                                                                                                             Patient Demographics:    Manuel Rosales, is a 58 y.o. male, DOB - [birth year], FMW:[id number]  Outpatient Primary MD for the patient is [facility]    LOS - 2  Admit date - 03/18/2024    No chief complaint on file.      Brief Narrative (HPI from H&P)   58 y.o. male with history of HIV, chronic HFrEF with improved EF, CAD, anemia, had presented to the ER at [facility] with complaints of difficulty speaking.  Patient was admitted to [facility] with concern for possible seizures versus TIA.  MRI brain and CT angiogram head and neck were unremarkable.  Given the intermittent episodes of patient having difficulty bringing out words neurologist wanted patient to be having continuous EEG monitoring and was transferred to [facility].     Subjective:   Patient in bed, appears comfortable, denies any headache, no fever, no chest pain or pressure, no shortness of breath , no abdominal pain. No focal weakness.   Assessment  & Plan :    Difficulty speaking - question underlying seizure activity.  Patient has no headache or focal deficits, MRI brain, CTA head and neck and spot EEG and LTM EEG both unremarkable, overall symptoms highly inconsistent, speech almost normal upon distraction, PT OT and speech consult, upon evaluation by neurology, speech and full neurological workup and his symptoms being inconsistent this likely is not a neurological issue, could have conversion disorder, have discussed with psychiatry on 03/19/2024, patient will be seen on 03/20/2024 by psychiatry History of chronic HFrEF with improved EF last EF measured yesterday was 55 to 60% with grade 1 diastolic dysfunction.  Over the last 2 days at  [facility] patient was only on Coreg .  Which we will continue. History of CAD denies any chest pain.  Continue Coreg  statins and aspirin . Mild hypokalemia replace and recheck. Anemia macrocytic will check anemia panel.  Follow CBC. HIV on Biktarvy .  No recent CD4 count and viral load in the chart.  Will order. Diabetes mellitus type 2 last hemoglobin A1c was 5.1 yesterday.  On sliding scale coverage.  Hypertension on Coreg . Tobacco abuse and alcohol use advised about quitting.  Has not been off alcohol last 3 days.  On CIWA.  Thiamine . CKD 3A.  Baseline creatinine around 1.3.  Monitor.    Lab Results  Component Value Date   HGBA1C 5.1 03/16/2024   CBG (last 3)  Recent Labs    03/20/24 0003 03/20/24 0438 03/20/24 0826  GLUCAP 143* 130* 205*        Condition - Fair  Family Communication  :  None  Code Status :  Full  Consults  :  Neuro  PUD Prophylaxis :     Procedures  :     MRI -   1. No acute intracranial abnormality. 2. Mildly age advanced cerebral white matter signal changes, nonspecific but most commonly due to chronic small vessel disease. 3. 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  EEG -  This normal EEG is recorded in the waking and drowsy state. There was no seizure or seizure predisposition recorded on this study. Please note that lack of epileptiform activity on EEG does not preclude the possibility of epilepsy. Though there was no definite evidence of epileptic activity, if the patient continues to have episodes, could consider transfer for longer-term monitoring but at the current time I do not think that this study supports an epileptic nature to his events.  TTE - 1. Left ventricular ejection fraction, by estimation, is 55 to 60%. Left ventricular ejection fraction by PLAX is 54 %. The left ventricle has normal function. The left ventricle has no regional wall motion abnormalities. Left ventricular diastolic parameters are  consistent with Grade I diastolic dysfunction (impaired relaxation).  2. Right ventricular systolic function is normal. The right ventricular size is normal. There is normal pulmonary artery systolic pressure. The estimated right ventricular systolic pressure is 20.1 mmHg.  3. The mitral valve is normal in structure. No evidence of mitral valve regurgitation. No evidence of mitral stenosis.  4. The aortic valve is tricuspid. Aortic valve regurgitation is not visualized. No aortic stenosis is present.  5. The inferior vena cava is normal in size with greater than 50% respiratory variability, suggesting right atrial pressure of 3 mmHg.  CTA - 1. No large vessel occlusion or significant stenosis in the head or neck. 2. No acute intracranial abnormality. 3. 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       Disposition Plan  :    Status is: Inpatient  DVT Prophylaxis  :    enoxaparin  (LOVENOX ) injection 40 mg Start: 03/18/24 2245    Lab Results  Component Value Date   PLT 324 03/19/2024    Diet :  Diet Order             Diet heart healthy/carb modified Room service appropriate? Yes; Fluid consistency: Thin  Diet effective now                    Inpatient Medications  Scheduled Meds:  amLODipine   5 mg Oral Daily   aspirin  EC  81 mg Oral Daily   atorvastatin   40 mg Oral Daily   bictegravir-emtricitabine -tenofovir  AF  1 tablet Oral Daily   carvedilol   6.25 mg Oral BID WC   cyanocobalamin   1,000 mcg Oral Daily   enoxaparin  (LOVENOX ) injection  40 mg Subcutaneous QHS   folic acid   1 mg Oral Daily   insulin  aspart  0-9 Units Subcutaneous TID WC   multivitamin with minerals  1 tablet  Oral Daily   thiamine   100 mg Oral Daily   Or   thiamine   100 mg Intravenous Daily   Continuous Infusions: PRN Meds:.  Antibiotics  :    Anti-infectives (From admission, onward)    Start     Dose/Rate Route Frequency Ordered Stop   03/19/24 0800  bictegravir-emtricitabine -tenofovir  AF  (BIKTARVY ) 50-200-25 MG per tablet 1 tablet        1 tablet Oral Daily 03/18/24 2232           Objective:   Vitals:   03/19/24 2038 03/19/24 2333 03/20/24 0438 03/20/24 0827  BP: (!) 143/89 129/81 127/73 (!) 132/92  Pulse: 72 73  (!) 105  Resp: 19 15  19   Temp: 98.6 F (37 C) 98.6 F (37 C) (!) 97.3 F (36.3 C) 98.7 F (37.1 C)  TempSrc: Oral Oral Oral Oral  SpO2: 93% 94%    Height:        Wt Readings from Last 3 Encounters:  03/17/24 68.6 kg  10/30/23 74.1 kg  02/26/23 72.6 kg     Intake/Output Summary (Last 24 hours) at 03/20/2024 0958 Last data filed at 03/19/2024 2049 Gross per 24 hour  Intake 1185 ml  Output --  Net 1185 ml     Physical Exam  Awake Alert, stuttering speech [place].AT,PERRAL Supple Neck, No JVD,   Symmetrical Chest wall movement, Good air movement bilaterally, CTAB RRR,No Gallops,Rubs or new Murmurs,  +ve B.Sounds, Abd Soft, No tenderness,   No Cyanosis, Clubbing or edema       Data Review:    Recent Labs  Lab 03/16/24 2200 03/16/24 2205 03/18/24 0903 03/19/24 0556  WBC 7.8 7.3 6.3 4.8  HGB 11.8* 11.6* 11.4* 11.2*  HCT 35.8* 36.1* 34.5* 32.8*  PLT 366 353 321 324  MCV 99.7 104* 98.0 97.9  MCH 32.9 33.5* 32.4 33.4  MCHC 33.0 32.1 33.0 34.1  RDW 13.3 12.7 13.0 13.0  LYMPHSABS 1.1 1.3 1.1  --   MONOABS 0.5  --  0.6  --   EOSABS 0.0 0.0 0.2  --   BASOSABS 0.1 0.1 0.1  --     Recent Labs  Lab 03/16/24 2200 03/16/24 2204 03/18/24 0903 03/19/24 0556 03/19/24 0557 03/20/24 0631  NA 141  --  140  --  140 138  K 3.3*  --  3.4*  --  3.9 3.7  CL 104  --  102  --  107 105  CO2 27  --  28  --  23 23  ANIONGAP 10  --  10  --  10 10  GLUCOSE 148*  --  123*  --  118* 127*  BUN 13  --  14  --  19 18  CREATININE 1.21  --  1.33* 1.30* 1.32* 1.14  AST 43*  --   --   --   --   --   ALT 18  --   --   --   --   --   ALKPHOS 53  --   --   --   --   --   BILITOT 0.9  --   --   --   --   --   ALBUMIN 3.9  --   --   --   --   --   INR  1.0  --   --   --   --   --   TSH  --   --   --  4.651*  --   --  HGBA1C  --  5.1  --   --   --   --   MG  --   --  2.0  --  2.0 2.0  PHOS  --   --  4.2  --   --   --   CALCIUM  9.2  --  9.0  --  9.1 9.0      Recent Labs  Lab 03/16/24 2200 03/16/24 2204 03/18/24 0903 03/19/24 0556 03/19/24 0557 03/20/24 0631  INR 1.0  --   --   --   --   --   TSH  --   --   --  4.651*  --   --   HGBA1C  --  5.1  --   --   --   --   MG  --   --  2.0  --  2.0 2.0  CALCIUM  9.2  --  9.0  --  9.1 9.0    --------------------------------------------------------------------------------------------------------------- Lab Results  Component Value Date   CHOL 120 03/17/2024   HDL 59 03/17/2024   LDLCALC 40 03/17/2024   TRIG 107 03/17/2024   CHOLHDL 2.0 03/17/2024    Lab Results  Component Value Date   HGBA1C 5.1 03/16/2024   Recent Labs    03/19/24 0556  TSH 4.651*   Recent Labs    03/18/24 0903 03/19/24 0556 03/19/24 0557  VITAMINB12 225 240  --   FOLATE 17.6  --  11.7  FERRITIN  --  24  --   TIBC 371 370  --   IRON 81 40*  --   RETICCTPCT  --  1.2  --    ------------------------------------------------------------------------------------------------------------------ Cardiac Enzymes No results for input(s): CKMB, TROPONINI, MYOGLOBIN in the last 168 hours.  Invalid input(s): CK  Micro Results No results found for this or any previous visit (from the past 240 hours).  Radiology Report Overnight EEG with video Result Date: 03/20/2024 Shelton Arlin KIDD, MD     03/20/2024  6:07 AM Patient Name: KYNDAL GLOSTER MRN: [id number] Epilepsy Attending: Arlin KIDD Shelton Referring Physician/Provider: everitt Clint Abbey Earle FORBES, NP Duration: 03/19/2024 1051 to 03/20/2024 0600 Patient history: 58 y.o. male with hx of HIV, CHF, CAD and anemia who presents with recurrent episodes of aphemia. EEG to evaluate for seizure Level of alertness: Awake, asleep AEDs during EEG study: None Technical  aspects: This EEG study was done with scalp electrodes positioned according to the 10-20 International system of electrode placement. Electrical activity was reviewed with band pass filter of 1-70Hz , sensitivity of 7 uV/mm, display speed of 31mm/sec with a 60Hz  notched filter applied as appropriate. EEG data were recorded continuously and digitally stored.  Video monitoring was available and reviewed as appropriate. Description: The posterior dominant rhythm consists of 9 Hz activity of moderate voltage (25-35 uV) seen predominantly in posterior head regions, symmetric and reactive to eye opening and eye closing. Sleep was characterized by vertex waves, sleep spindles (12 to 14 Hz), maximal frontocentral region. Hyperventilation and photic stimulation were not performed.   IMPRESSION: This study is within normal limits. No seizures or epileptiform discharges were seen throughout the recording. A normal interictal EEG does not exclude the diagnosis of epilepsy. Priyanka O Yadav     Signature  -   Lavada Stank M.D on 03/20/2024 at 9:58 AM   -  To page go to [email or web address]

## 2024-03-20 NOTE — Procedures (Addendum)
 Patient Name: Manuel Rosales  MRN: [id number]  Epilepsy Attending: Arlin MALVA Krebs  Referring Physician/Provider: everitt Clint Abbey Earle FORBES, NP Duration: 03/19/2024 1051 to 03/20/2024 1051  Patient history: 58 y.o. male with hx of HIV, CHF, CAD and anemia who presents with recurrent episodes of aphemia. EEG to evaluate for seizure  Level of alertness: Awake, asleep  AEDs during EEG study: None  Technical aspects: This EEG study was done with scalp electrodes positioned according to the 10-20 International system of electrode placement. Electrical activity was reviewed with band pass filter of 1-70Hz , sensitivity of 7 uV/mm, display speed of 53mm/sec with a 60Hz  notched filter applied as appropriate. EEG data were recorded continuously and digitally stored.  Video monitoring was available and reviewed as appropriate.  Description: The posterior dominant rhythm consists of 9 Hz activity of moderate voltage (25-35 uV) seen predominantly in posterior head regions, symmetric and reactive to eye opening and eye closing. Sleep was characterized by vertex waves, sleep spindles (12 to 14 Hz), maximal frontocentral region. Hyperventilation and photic stimulation were not performed.     Event button was pressed on 03/20/2024 at 1009 for unclear reasons. Patient was moving around in bed and appeared to be looking for something. Concomitant eeg before, during and after the event didn't show any eeg change to suggest seizure.   IMPRESSION: This study is within normal limits. No seizures or epileptiform discharges were seen throughout the recording.  Event button was pressed on 03/20/2024 at 1009 for unclear reasons without concomitant eeg change. This was not an epileptic event.   Manuel Rosales

## 2024-03-20 NOTE — Plan of Care (Signed)

## 2024-03-20 NOTE — Progress Notes (Signed)
 LTM maint complete - no skin breakdown under:  A1, Fp2, T7

## 2024-03-20 NOTE — Progress Notes (Signed)
 NEUROLOGY CONSULT FOLLOW UP NOTE   Date of service: March 20, 2024 Patient Name: Manuel Rosales MRN:  [id number] DOB:  March 26, 1966  Interval Hx/subjective  Patient has remained hemodynamically stable and afebrile overnight.  He states that he has had some episodes of speech dysfunction since the LTM EEG has been hooked up, but the button was not pushed.  EEG was negative for seizure activity.  Speech is noted to be normal this morning.  Vitals   Vitals:   03/19/24 2038 03/19/24 2333 03/20/24 0438 03/20/24 0827  BP: (!) 143/89 129/81 127/73 (!) 132/92  Pulse: 72 73  (!) 105  Resp: 19 15  19   Temp: 98.6 F (37 C) 98.6 F (37 C) (!) 97.3 F (36.3 C) 98.7 F (37.1 C)  TempSrc: Oral Oral Oral Oral  SpO2: 93% 94%    Height:         Body mass index is 19.95 kg/m.  Physical Exam   Constitutional: Appears well-developed and well-nourished.  Psych: Affect appropriate to situation.  Eyes: No scleral injection.  HENT: No OP obstrucion.  Head: Normocephalic.  Cardiovascular: Normal rate and regular rhythm.  Respiratory: Effort normal, non-labored breathing.  Skin: WDI.   Neurologic Examination   Mental Status: AA&Ox3  Speech/Language: speech is without dysarthria or aphasia.  Repetition, fluency, and comprehension intact. Cranial Nerves:  II: PERRL.  III, IV, VI: EOMI. Eyelids elevate symmetrically.  V: Sensation is intact to light touch and symmetrical to face.  VII: Smile is symmetrical.  VIII: hearing intact to voice. IX, X: Phonation is normal.  KP:Dynloizm shrug 5/5. XII: tongue is midline without fasciculations. Motor: 5/5 strength to all muscle groups tested.  Tone is normal and bulk is normal Sensation- Intact to light touch bilaterally.  Coordination: FTN intact bilaterally Gait- Deferred   Medic.ations  Current Facility-Administered Medications:    amLODipine  (NORVASC ) tablet 5 mg, 5 mg, Oral, Daily, Singh, Prashant K, MD, 5 mg at 03/20/24 0848   aspirin   EC tablet 81 mg, 81 mg, Oral, Daily, Kakrakandy, Arshad N, MD, 81 mg at 03/20/24 0848   atorvastatin  (LIPITOR) tablet 40 mg, 40 mg, Oral, Daily, Kakrakandy, Arshad N, MD, 40 mg at 03/20/24 0848   bictegravir-emtricitabine -tenofovir  AF (BIKTARVY ) 50-200-25 MG per tablet 1 tablet, 1 tablet, Oral, Daily, Franky Redia SAILOR, MD, 1 tablet at 03/20/24 0848   carvedilol  (COREG ) tablet 6.25 mg, 6.25 mg, Oral, BID WC, Kakrakandy, Arshad N, MD, 6.25 mg at 03/20/24 0848   cyanocobalamin  (VITAMIN B12) tablet 1,000 mcg, 1,000 mcg, Oral, Daily, Kakrakandy, Arshad N, MD, 1,000 mcg at 03/20/24 0848   enoxaparin  (LOVENOX ) injection 40 mg, 40 mg, Subcutaneous, QHS, Franky Redia SAILOR, MD, 40 mg at 03/19/24 2126   folic acid  (FOLVITE ) tablet 1 mg, 1 mg, Oral, Daily, Kakrakandy, Arshad N, MD, 1 mg at 03/20/24 0848   insulin  aspart (novoLOG ) injection 0-9 Units, 0-9 Units, Subcutaneous, TID WC, Franky Redia SAILOR, MD, 3 Units at 03/20/24 0848   multivitamin with minerals tablet 1 tablet, 1 tablet, Oral, Daily, Singh, Prashant K, MD   thiamine  (VITAMIN B1) tablet 100 mg, 100 mg, Oral, Daily, 100 mg at 03/20/24 0848 **OR** thiamine  (VITAMIN B1) injection 100 mg, 100 mg, Intravenous, Daily, Franky Redia SAILOR, MD  Labs and Diagnostic Imaging   CBC:  Recent Labs  Lab 03/16/24 2205 03/18/24 0903 03/19/24 0556  WBC 7.3 6.3 4.8  NEUTROABS 5.3 4.2  --   HGB 11.6* 11.4* 11.2*  HCT 36.1* 34.5* 32.8*  MCV 104* 98.0  97.9  PLT 353 321 324    Basic Metabolic Panel:  Lab Results  Component Value Date   NA 138 03/20/2024   K 3.7 03/20/2024   CO2 23 03/20/2024   GLUCOSE 127 (H) 03/20/2024   BUN 18 03/20/2024   CREATININE 1.14 03/20/2024   CALCIUM  9.0 03/20/2024   GFRNONAA >60 03/20/2024   GFRAA 39 (L) 10/03/2017   Lipid Panel:  Lab Results  Component Value Date   LDLCALC 40 03/17/2024   HgbA1c:  Lab Results  Component Value Date   HGBA1C 5.1 03/16/2024   Urine Drug Screen: No results found for:  LABOPIA, COCAINSCRNUR, LABBENZ, AMPHETMU, THCU, LABBARB  Alcohol Level     Component Value Date/Time   ETH <15 03/16/2024 2200   INR  Lab Results  Component Value Date   INR 1.0 03/16/2024   APTT  Lab Results  Component Value Date   APTT 23 (L) 03/16/2024   CT Head without contrast(Personally reviewed): No acute abnormality   CT angio Head and Neck with contrast(Personally reviewed): No LVO or hemodynamically significant stenosis   MRI Brain(Personally reviewed): No acute abnormality  Continuous EEG 8/15 to 8/16:  This study is within normal limits. No seizures or epileptiform discharges were seen throughout the recording. A normal interictal EEG does not exclude the diagnosis of epilepsy.  Assessment   JILLIAN PIANKA is a 58 y.o. male with a history of HIV, CHF, CAD and anemia who initially presented with difficulty speaking.  He has been having episodes where he cannot physically speak the words that he wants to speak but is able to understand what is being said and can communicate through gestures and writing, which suggests episodes of transient aphemia.  Imaging was negative for acute abnormalities, and continuous EEG overnight demonstrates no seizure activity.  As patient states that he has had some episodes during that time, this increases the likelihood that these are nonepileptic and possibly functional in nature.  However, a deep seizure focus could also result in focal seizure activity that is not detectable by surface-electrode EEG. Will continue LTM EEG for another day and see if an episode can be definitively captured and flagged.  Instructed patient to push LTM button if another episode occurs.  Recommendations  - Continue LTM EEG for 1 more day - Consider Psychology consult for possible functional speech abnormality if LTM tomorrow is negative.. - Neurology will continue to  follow ______________________________________________________________________ Patient seen by NP. Signed, Cortney E Everitt Clint Kill, NP [facility]   Electronically signed: Dr. Arrie Borrelli

## 2024-03-20 NOTE — Consult Note (Signed)
 [facility] Psychiatric Consult Initial  Patient Name: .Manuel Rosales  MRN: [id number]  DOB: 03-26-1966  Consult Order details:  Orders (From admission, onward)     Start     Ordered   03/19/24 1428  IP CONSULT TO PSYCHIATRY       Ordering Provider: Dennise Lavada POUR, MD  Provider:  (Not yet assigned)  Question Answer Comment  Location [facility]   Reason for Consult? malingering      03/19/24 1428             Mode of Visit: In person    Psychiatry Consult Evaluation  Service Date: March 20, 2024 LOS:  LOS: 2 days  Chief Complaint  I am doing pretty well, I don't think I need any psychiatric evaluation at this moment.   Primary Psychiatric Diagnoses  Adjustment disorder, unspecified   Assessment  JHAN CONERY is a 58 y.o. male admitted: Medicallyfor 03/18/2024  8:48 PM for difficulty speaking. He has no prior psychiatric diagnoses  and has a past medical history of HIV, chronic HFrEF with improved EF, CAD, anemia. Psychiatry was consulted for malingering.  His current presentation of coherent speech, logical conversation, and absence of depressive symptoms, anxiety or psychotic symptoms with recent episode of difficulty speaking following a seizure-like episode is most consistent with adjustment disorder, unspecified. He does not meet criteria for inpatient psychiatric admission based on absent of acute psychosis, depressive symptoms, suicidal or homicidal ideation, intent or plan. Patient is not currently on any outpatient psychotropic medications and is not currently seeing any psychiatrist or therapist. On initial examination, patient is awake, alert and oriented to time, place, person, and situation. His speech is full volume, with normal tone. Mood is great with congruent and reactive affect. Thought process is logical and goal directed. No perceptual abnormalities. Please see plan below for detailed recommendations.   Diagnoses:  Active  Hospital problems: Principal Problem:   Difficulty with speech Active Problems:   HTN (hypertension)   Tobacco use   Difficulty speaking   Chronic HFrEF (heart failure with reduced ejection fraction) (HCC)   Alcohol abuse   Anemia   Diabetes mellitus type 2 in nonobese (HCC)   Hypokalemia    Plan   ## Psychiatric Medication Recommendations:  --Patient does not require any psychotropic medications at this time. --Please continue medical treatment as the primary team. --Psychiatric consult service will sign off at this time --Thank you for this consult.   ## Medical Decision Making Capacity: Not specifically addressed in this encounter  ## Further Work-up:  --  EKG -- most recent EKG on 03/16/2024 had QtC of 427 -- Pertinent labwork reviewed earlier this admission includes: HB-11.2   ## Disposition:-- There are no psychiatric contraindications to discharge at this time  ## Behavioral / Environmental: - No specific recommendations at this time.     ## Safety and Observation Level:  - Based on my clinical evaluation, I estimate the patient to be at low risk of self harm in the current setting. - At this time, we recommend  routine. This decision is based on my review of the chart including patient's history and current presentation, interview of the patient, mental status examination, and consideration of suicide risk including evaluating suicidal ideation, plan, intent, suicidal or self-harm behaviors, risk factors, and protective factors. This judgment is based on our ability to directly address suicide risk, implement suicide prevention strategies, and develop a safety plan while the patient is in the  clinical setting. Please contact our team if there is a concern that risk level has changed.  CSSR Risk Category:C-SSRS RISK CATEGORY: No Risk  Suicide Risk Assessment: Patient has following modifiable risk factors for suicide: triggering events, which we are addressing by  advising primary team to continue current treatment. Patient has following non-modifiable or demographic risk factors for suicide: male gender Patient has the following protective factors against suicide: Supportive family and no history of suicide attempts  Thank you for this consult request. Recommendations have been communicated to the primary team.  We will sign off at this time.   Jan DELENA Donath, MD       History of Present Illness  Relevant Aspects of [facility] Course:   Patient Report:  Patient seen face to face in his hospital room. He is awake, alert and oriented 4 x. Patient denies any ongoing psychiatric symptoms as evidenced by absence of depressed mood, lack of motivation, sleep issues, appetite problem, poor concentration, hopelessness or helplessness. Additionally, patient denies anxiety, feeling apprehensive, restless, or nervous. He denies hallucinations, delusions, idea of reference, thought broadcasting or thought insertion. Patient does  not appear to be intoxicated or withdrawing from any recreational  substances at this time. He denies suicidal or homicidal ideation, intent or plan. Patient denies any previous psychiatric diagnoses and has never taken any psychotropic medications in his life. He denies use of any illicit substances and urine drug screen is negative.   When asked about the circumstances leading to this hospitalization, patient states his brother whom he lives with called the ambulance on Wednesday because his brother thought he was having a seizure. However, patient denies ever experiencing seizure-like episode  in his life and has not experienced any seizure episodes since hospitalization. Patient currently lives with his sister and brother in a three bedroom apartment fully paid for by their mother. He does not currently have a job but supports himself with disability benefits. No sign suggestive of secondary gain at this time.   Review of Systems   Psychiatric/Behavioral:  Negative for depression, hallucinations, substance abuse and suicidal ideas. The patient is not nervous/anxious and does not have insomnia.      Psychiatric and Social History  Psychiatric History:  Information collected from the patient  Prev Dx/Sx: None  Current Psych Provider: None  Home Meds (current): None  Previous Med Trials:None  Therapy: None   Prior Psych Hospitalization: patient denies  Prior Self Harm: patient denies  Prior Violence: patient denies  Family Psych History:denies  Family Hx suicide:denies   Social History:  Educational Hx: dropped out of high school. Occupational Hx: unemployed but supports himself with disability benefit.  Legal Hx: patient denies  Living Situation: lives with his brother and sister Spiritual Hx:  Access to weapons/lethal means: Patient denies    Substance History Alcohol: socially   Type of alcohol beer Last Drink few days ago Number of drinks per day 2-3 beer weekly  History of alcohol withdrawal seizures patient denies  History of DT's denies  Tobacco: patient denies Illicit drugs:patient denies  Prescription drug abuse:denies  Rehab yk:izwpzd   Exam Findings  Physical Exam:  Vital Signs:  Temp:  [97.3 F (36.3 C)-98.7 F (37.1 C)] 98.7 F (37.1 C) (08/16 0827) Pulse Rate:  [70-105] 105 (08/16 0827) Resp:  [15-20] 19 (08/16 0827) BP: (127-145)/(73-92) 132/92 (08/16 0827) SpO2:  [93 %-97 %] 94 % (08/15 2333) Blood pressure (!) 132/92, pulse (!) 105, temperature 98.7 F (37.1 C), temperature source  Oral, resp. rate 19, height 6' 1 (1.854 m), SpO2 94%. Body mass index is 19.95 kg/m.  Physical Exam  Mental Status Exam: General Appearance: Casual  Orientation:  Full (Time, Place, and Person)  Memory:  Immediate;   Good Recent;   Good Remote;   Good  Concentration:  Concentration: Good and Attention Span: Good  Recall:  Good  Attention  Good  Eye Contact:  Good  Speech:  Normal Rate   Language:  Good  Volume:  Normal  Mood: I feel great  Affect:  Appropriate and Congruent  Thought Process:  Coherent and Goal Directed  Thought Content:  Logical  Suicidal Thoughts:  No  Homicidal Thoughts:  No  Judgement:  Good  Insight:  Good  Psychomotor Activity:  Normal  Akathisia:  No  Fund of Knowledge:  Good      Assets:  Communication Skills Social Support  Cognition:  WNL  ADL's:  Intact  AIMS (if indicated):        Other History   These have been pulled in through the EMR, reviewed, and updated if appropriate.  Family History:  The patient's family history includes Cerebral aneurysm in his brother; Diabetes in his mother; Heart attack in his father; Hypertension in his mother.  Medical History: Past Medical History:  Diagnosis Date   Chronic systolic CHF (congestive heart failure) (HCC) 06/2017   a. TTE 11/18: EF of 20-25% , difuse hypokinesis, mild to moderate tricuspid regurgitation, and mild pulmonary hypertension   Coronary artery disease 06/2017   Non-ST elevation myocardial infarction.  Treated medically with no cardiac cath done due to active infection and renal failure.   Dermatitis    HIV (human immunodeficiency virus infection) (HCC)    Hypertension    Pulmonary hypertension (HCC)    a. TTE 11/18: EF of 20-25% , difuse hypokinesis, mild to moderate tricuspid regurgitation, and mild pulmonary hypertension w/ PASP 40 mmHg    Surgical History: Past Surgical History:  Procedure Laterality Date   APPENDECTOMY     IR RADIOLOGIST EVAL & MGMT  06/19/2017     Medications:   Current Facility-Administered Medications:    amLODipine  (NORVASC ) tablet 5 mg, 5 mg, Oral, Daily, Singh, Prashant K, MD, 5 mg at 03/20/24 0848   aspirin  EC tablet 81 mg, 81 mg, Oral, Daily, Franky Redia SAILOR, MD, 81 mg at 03/20/24 0848   atorvastatin  (LIPITOR) tablet 40 mg, 40 mg, Oral, Daily, Franky Redia SAILOR, MD, 40 mg at 03/20/24 0848    bictegravir-emtricitabine -tenofovir  AF (BIKTARVY ) 50-200-25 MG per tablet 1 tablet, 1 tablet, Oral, Daily, Franky Redia SAILOR, MD, 1 tablet at 03/20/24 0848   carvedilol  (COREG ) tablet 6.25 mg, 6.25 mg, Oral, BID WC, Kakrakandy, Arshad N, MD, 6.25 mg at 03/20/24 0848   cyanocobalamin  (VITAMIN B12) tablet 1,000 mcg, 1,000 mcg, Oral, Daily, Kakrakandy, Arshad N, MD, 1,000 mcg at 03/20/24 0848   enoxaparin  (LOVENOX ) injection 40 mg, 40 mg, Subcutaneous, QHS, Franky Redia SAILOR, MD, 40 mg at 03/19/24 2126   folic acid  (FOLVITE ) tablet 1 mg, 1 mg, Oral, Daily, Kakrakandy, Arshad N, MD, 1 mg at 03/20/24 0848   insulin  aspart (novoLOG ) injection 0-9 Units, 0-9 Units, Subcutaneous, TID WC, Franky Redia SAILOR, MD, 3 Units at 03/20/24 0848   multivitamin with minerals tablet 1 tablet, 1 tablet, Oral, Daily, Singh, Prashant K, MD   thiamine  (VITAMIN B1) tablet 100 mg, 100 mg, Oral, Daily, 100 mg at 03/20/24 0848 **OR** thiamine  (VITAMIN B1) injection 100 mg, 100 mg, Intravenous,  Daily, Franky Redia SAILOR, MD  Allergies: No Known Allergies  Walta Bellville A Arcadio Cope, MD

## 2024-03-20 NOTE — Plan of Care (Signed)

## 2024-03-20 NOTE — TOC Initial Note (Addendum)
 Transition of Care [facility]) - Initial/Assessment Note    Patient Details  Name: Manuel Rosales MRN: [id number] Date of Birth: 20-Jul-1966  Transition [facility]) CM/SW Contact:    Inocente GORMAN Kindle, LCSW Phone Number: 03/20/2024, 8:32 AM  Clinical Narrative:                 8:30am-Patient admitted from home with need for Neurology consult. CSW following for ETOH consult.   11:48 AM-CSW met with patient and discussed ETOH use. He stated he drinks 3 beers a day and smokes. He declined that drinking is an issue for him and declined community resources. He stated he lives with his brothers and has transportation and a PCP. No further needs identified at this time.   Expected Discharge Plan: Home/Self Care Barriers to Discharge: Continued Medical Work up   Patient Goals and CMS Choice            Expected Discharge Plan and Services In-house Referral: Clinical Social Work     Living arrangements for the past 2 months: Single Family Home                                      Prior Living Arrangements/Services Living arrangements for the past 2 months: Single Family Home   Patient language and need for interpreter reviewed:: Yes Do you feel safe going back to the place where you live?: Yes      Need for Family Participation in Patient Care: Yes (Comment) Care giver support system in place?: Yes (comment)   Criminal Activity/Legal Involvement Pertinent to Current Situation/Hospitalization: No - Comment as needed  Activities of Daily Living   ADL Screening (condition at time of admission) Independently performs ADLs?: Yes (appropriate for developmental age) Is the patient deaf or have difficulty hearing?: No Does the patient have difficulty seeing, even when wearing glasses/contacts?: No Does the patient have difficulty concentrating, remembering, or making decisions?: No  Permission Sought/Granted                  Emotional Assessment Appearance:: Appears  stated age     Orientation: : Oriented to Self, Oriented to Place, Oriented to  Time, Oriented to Situation Alcohol / Substance Use: Alcohol Use Psych Involvement: No (comment)  Admission diagnosis:  Seizure (HCC) [R56.9] Difficulty with speech [R47.9] Patient Active Problem List   Diagnosis Date Noted   Difficulty speaking 03/18/2024   Chronic HFrEF (heart failure with reduced ejection fraction) (HCC) 03/18/2024   Alcohol abuse 03/18/2024   Anemia 03/18/2024   Difficulty with speech 03/18/2024   Diabetes mellitus type 2 in nonobese (HCC) 03/18/2024   Hypokalemia 03/18/2024   Coronary artery disease 03/16/2024   Focal neurological deficit 03/16/2024   Tobacco use 02/17/2018   Hypotension 07/24/2017   Acute on chronic heart failure (HCC) 07/18/2017   HTN (hypertension) 06/18/2017   NSTEMI (non-ST elevated myocardial infarction) [facility])    CHF (congestive heart failure) (HCC) 06/08/2017   HIV disease (HCC) 06/08/2017   PCP:  [facility] Pharmacy:   [id number] [address]), [place] - [address] [place] ([place]) [place] [address] Phone: [phone] Fax: [phone]     Social Drivers of Health (SDOH) Social History: SDOH Screenings   Food Insecurity: Unknown (03/19/2024)  Housing: Low Risk  (03/18/2024)  Transportation Needs: No Transportation Needs (03/18/2024)  Utilities: Not At Risk (03/18/2024)  Depression (PHQ2-9): Low Risk  (02/22/2022)  Financial Resource Strain: Low Risk  (02/22/2022)  Physical Activity: Inactive (06/18/2017)  Social Connections: Moderately Isolated (06/18/2017)  Stress: Stress Concern Present (06/18/2017)  Tobacco Use: High Risk (03/18/2024)   SDOH Interventions:     Readmission Risk Interventions     No data to display

## 2024-03-21 ENCOUNTER — Inpatient Hospital Stay (HOSPITAL_COMMUNITY)

## 2024-03-21 DIAGNOSIS — R4789 Other speech disturbances: Secondary | ICD-10-CM

## 2024-03-21 DIAGNOSIS — R479 Unspecified speech disturbances: Secondary | ICD-10-CM | POA: Diagnosis not present

## 2024-03-21 DIAGNOSIS — R569 Unspecified convulsions: Secondary | ICD-10-CM | POA: Diagnosis not present

## 2024-03-21 LAB — BASIC METABOLIC PANEL WITH GFR
Anion gap: 8 (ref 5–15)
BUN: 18 mg/dL (ref 6–20)
CO2: 23 mmol/L (ref 22–32)
Calcium: 9.2 mg/dL (ref 8.9–10.3)
Chloride: 107 mmol/L (ref 98–111)
Creatinine, Ser: 1.18 mg/dL (ref 0.61–1.24)
GFR, Estimated: >60 mL/min (ref >60)
Glucose, Bld: 136 mg/dL — ABNORMAL HIGH (ref 70–99)
Potassium: 3.8 mmol/L (ref 3.5–5.1)
Sodium: 138 mmol/L (ref 135–145)

## 2024-03-21 LAB — MAGNESIUM: Magnesium: 2.1 mg/dL (ref 1.7–2.4)

## 2024-03-21 LAB — GLUCOSE, CAPILLARY
Glucose-Capillary: 137 mg/dL — ABNORMAL HIGH (ref 70–99)
Glucose-Capillary: 218 mg/dL — ABNORMAL HIGH (ref 70–99)
Glucose-Capillary: 100 mg/dL — ABNORMAL HIGH (ref 70–99)

## 2024-03-21 NOTE — Plan of Care (Signed)

## 2024-03-21 NOTE — Progress Notes (Signed)
 Family ride arrived, DC RN transported patient downstairs with Nephew for discharge.

## 2024-03-21 NOTE — Procedures (Signed)
 Patient Name: Manuel Rosales  MRN: [id number]  Epilepsy Attending: Arlin MALVA Krebs  Referring Physician/Provider: everitt Clint Abbey Earle FORBES, NP Duration: 03/20/2024 1051 to 03/21/2024 1025   Patient history: 58 y.o. male with hx of HIV, CHF, CAD and anemia who presents with recurrent episodes of aphemia. EEG to evaluate for seizure   Level of alertness: Awake, asleep   AEDs during EEG study: None   Technical aspects: This EEG study was done with scalp electrodes positioned according to the 10-20 International system of electrode placement. Electrical activity was reviewed with band pass filter of 1-70Hz , sensitivity of 7 uV/mm, display speed of 10mm/sec with a 60Hz  notched filter applied as appropriate. EEG data were recorded continuously and digitally stored.  Video monitoring was available and reviewed as appropriate.   Description: The posterior dominant rhythm consists of 9 Hz activity of moderate voltage (25-35 uV) seen predominantly in posterior head regions, symmetric and reactive to eye opening and eye closing. Sleep was characterized by vertex waves, sleep spindles (12 to 14 Hz), maximal frontocentral region. Hyperventilation and photic stimulation were not performed.  Event button was pressed on 03/20/2024 at 1538, 1608, 2206, 2246 and on 03/21/2024 at 0016, 0319, 0353 and 0743 for unclear reasons. Concomitant eeg before, during and after the event didn't show any eeg change to suggest seizure.       IMPRESSION: This study is within normal limits. No seizures or epileptiform discharges were seen throughout the recording.  Event button was pressed on 03/20/2024 at 1538, 1608, 2206, 2246 and on 03/21/2024 at 0016, 0319, 0353 and 0743 for unclear reasons without concomitant eeg change. These events were most likely NON epileptic.    Federico Maiorino O Anntonette Madewell

## 2024-03-21 NOTE — Discharge Instructions (Addendum)
 Follow with Primary MD [facility] in 7 days   Get CBC, CMP, Magnesium, 2 view Chest X ray -  checked next visit with your primary MD   Activity: As tolerated with Full fall precautions use walker/cane & assistance as needed  Disposition Home   Diet: Heart Healthy low carbohydrate.  Check CBGs q. ACHS  Special Instructions: If you have smoked or chewed Tobacco  in the last 2 yrs please stop smoking, stop any regular Alcohol  and or any Recreational drug use.  On your next visit with your primary care physician please Get Medicines reviewed and adjusted.  Please request your Prim.MD to go over all Hospital Tests and Procedure/Radiological results at the follow up, please get all Hospital records sent to your Prim MD by signing hospital release before you go home.  If you experience worsening of your admission symptoms, develop shortness of breath, life threatening emergency, suicidal or homicidal thoughts you must seek medical attention immediately by calling 911 or calling your MD immediately  if symptoms less severe.  You Must read complete instructions/literature along with all the possible adverse reactions/side effects for all the Medicines you take and that have been prescribed to you. Take any new Medicines after you have completely understood and accpet all the possible adverse reactions/side effects.   Do not drive when taking Pain medications.  Do not take more than prescribed Pain, Sleep and Anxiety Medications  Wear Seat belts while driving.

## 2024-03-21 NOTE — Discharge Summary (Signed)
 Manuel Rosales:[id number] DOB: Jan 04, 1966 DOA: 03/18/2024  PCP: [facility]  Admit date: 03/18/2024  Discharge date: 03/21/2024  Admitted From: Home   disposition: Home   Recommendations for Outpatient Follow-up:   Follow up with PCP in 1-2 weeks  PCP Please obtain BMP/CBC, 2 view CXR in 1week,  (see Discharge instructions)   PCP Please follow up on the following pending results:    Home Health: None Equipment/Devices: None Consultations: Psychiatry, neurology Discharge Condition: Stable    CODE STATUS: Full    Diet Recommendation: Heart Healthy low carbohydrate   No chief complaint on file.    Brief history of present illness from the day of admission and additional interim summary    58 y.o. male with history of HIV, chronic HFrEF with improved EF, CAD, anemia, had presented to the ER at [facility] with complaints of difficulty speaking.  Patient was admitted to [facility] with concern for possible seizures versus TIA.  MRI brain and CT angiogram head and neck were unremarkable.  Given the intermittent episodes of patient having difficulty bringing out words neurologist wanted patient to be having continuous EEG monitoring and was transferred to [facility] Course      Difficulty speaking - question underlying seizure activity.  Patient has no headache or focal deficits, MRI brain, CTA head and neck and spot EEG and LTM EEG both unremarkable, overall symptoms highly inconsistent, speech almost normal upon distraction, PT OT and speech consult, upon evaluation by neurology, speech and full neurological workup and his symptoms somewhat inconsistent upon being distracted, could have functional disorder, seen and  cleared by psychiatry, underwent 2-day long EEG which was unremarkable, he is now back to his baseline will be discharged home with outpatient neurology and PCP follow-up. History of chronic HFrEF with improved EF last EF measured yesterday was 55 to 60% with grade 1 diastolic dysfunction.  Continue home regimen. History of CAD denies any chest pain.  Continue Coreg  statins and aspirin . Mild hypokalemia replaced Anemia macrocytic will check anemia panel.  Follow CBC. HIV on Biktarvy .  No recent CD4 count and viral load in the chart.  Will order. Diabetes mellitus type 2 last hemoglobin A1c was 5.1, continue home regimen. Hypertension on Coreg . Tobacco abuse and alcohol use counseled to quit.  No signs of DTs. CKD 3A.  Baseline creatinine around 1.3.  Monitor.     Discharge diagnosis     Principal Problem:   Difficulty with speech Active Problems:   HTN (hypertension)   Tobacco use   Difficulty speaking   Chronic HFrEF (heart failure with reduced ejection fraction) (HCC)   Alcohol  abuse   Anemia   Diabetes mellitus type 2 in nonobese [facility])   Hypokalemia    Discharge instructions    Discharge Instructions     Discharge instructions   Complete by: As directed    Follow with Primary MD [facility] in 7 days   Get CBC, CMP, Magnesium, 2 view Chest X ray -  checked next visit with your primary MD   Activity: As tolerated with Full fall precautions use walker/cane & assistance as needed  Disposition Home   Diet: Heart Healthy low carbohydrate.  CBGs q. ACHS.  Special Instructions: If you have smoked or chewed Tobacco  in the last 2 yrs please stop smoking, stop any regular Alcohol  and or any Recreational drug use.  On your next visit with your primary care physician please Get Medicines reviewed and adjusted.  Please request your Prim.MD to go over all Hospital Tests and Procedure/Radiological results at the follow up, please get all Hospital records sent  to your Prim MD by signing hospital release before you go home.  If you experience worsening of your admission symptoms, develop shortness of breath, life threatening emergency, suicidal or homicidal thoughts you must seek medical attention immediately by calling 911 or calling your MD immediately  if symptoms less severe.  You Must read complete instructions/literature along with all the possible adverse reactions/side effects for all the Medicines you take and that have been prescribed to you. Take any new Medicines after you have completely understood and accpet all the possible adverse reactions/side effects.   Do not drive when taking Pain medications.  Do not take more than prescribed Pain, Sleep and Anxiety Medications  Wear Seat belts while driving.   Increase activity slowly   Complete by: As directed        Discharge Medications   Allergies as of 03/21/2024   No Known Allergies      Medication List     STOP taking these medications    cetirizine 10 MG tablet Commonly known as: ZYRTEC   cyclobenzaprine  5 MG tablet Commonly known as: FLEXERIL    ibuprofen  800 MG tablet Commonly known as: ADVIL        TAKE these medications    aspirin  EC 81 MG tablet Take 1 tablet (81 mg total) by mouth daily.   atorvastatin  40 MG tablet Commonly known as: LIPITOR Take 1 tablet by mouth once daily   bictegravir-emtricitabine -tenofovir  AF 50-200-25 MG Tabs tablet Commonly known as: BIKTARVY  Take 1 tablet by mouth daily.   carvedilol  6.25 MG tablet Commonly known as: COREG  Take 1 tablet (6.25 mg total) by mouth 2 (two) times daily. Keep appt with [facility] for refills.   cyanocobalamin  1000 MCG tablet Commonly known as: VITAMIN B12 Take 1 tablet (1,000 mcg total) by mouth daily. What changed: Another medication with the same name was removed. Continue taking this medication, and follow the directions you see here.   furosemide  20 MG tablet Commonly known as: LASIX  Take  1 tablet by mouth once daily   glipiZIDE 5 MG tablet Commonly known as: GLUCOTROL Take 5 mg by mouth daily before breakfast.   metFORMIN 1000 MG tablet Commonly known as: GLUCOPHAGE Take 1,000 mg by mouth 2 (two) times daily with a meal.   nitroGLYCERIN  0.4 MG SL tablet Commonly known as: NITROSTAT  Place 1 tablet (0.4 mg total) under the tongue every 5 (five) minutes as needed for chest pain.   sacubitril -valsartan  49-51 MG Commonly known as: ENTRESTO  Take 1  tablet by mouth 2 (two) times daily.         Follow-up Information     [facility] Schedule an appointment as soon as possible for a visit in 1 week(s).   Contact information: [address] [place] [address] [phone]         [facility]. Schedule an appointment as soon as possible for a visit in 1 week(s).   Contact information: [address]     [address] [place]  [address] [phone]                Major procedures and Radiology Reports - PLEASE review detailed and final reports thoroughly  -       Overnight EEG with video Result Date: 03/20/2024 Shelton Arlin KIDD, MD     03/20/2024 12:07 PM Patient Name: Manuel Rosales MRN: [id number] Epilepsy Attending: Arlin KIDD Shelton Referring Physician/Provider: everitt Clint Abbey Earle FORBES, NP Duration: 03/19/2024 1051 to 03/20/2024 1051 Patient history: 58 y.o. male with hx of HIV, CHF, CAD and anemia who presents with recurrent episodes of aphemia. EEG to evaluate for seizure Level of alertness: Awake, asleep AEDs during EEG study: None Technical aspects: This EEG study was done with scalp electrodes positioned according to the 10-20 International system of electrode placement. Electrical activity was reviewed with band pass filter of 1-70Hz , sensitivity of 7 uV/mm, display speed of 57mm/sec with a 60Hz  notched filter applied as appropriate. EEG data were recorded continuously and digitally stored.  Video  monitoring was available and reviewed as appropriate. Description: The posterior dominant rhythm consists of 9 Hz activity of moderate voltage (25-35 uV) seen predominantly in posterior head regions, symmetric and reactive to eye opening and eye closing. Sleep was characterized by vertex waves, sleep spindles (12 to 14 Hz), maximal frontocentral region. Hyperventilation and photic stimulation were not performed.   Event button was pressed on 03/20/2024 at 1009 for unclear reasons. Patient was moving around in bed and appeared to be looking for something. Concomitant eeg before, during and after the event didn't show any eeg change to suggest seizure. IMPRESSION: This study is within normal limits. No seizures or epileptiform discharges were seen throughout the recording. Event button was pressed on 03/20/2024 at 1009 for unclear reasons without concomitant eeg change. This was not an epileptic event. Priyanka KIDD Shelton   CT ANGIO HEAD NECK W WO CM Result Date: 03/17/2024 EXAM: CTA Head and Neck with Intravenous Contrast. CT Head without Contrast. CLINICAL HISTORY: Stroke, follow up. Patient was brought in because of intermittent episodes of word finding difficulty on balance gait and slow response to verbal commands. TECHNIQUE: Axial CTA images of the head and neck performed with intravenous contrast. MIP reconstructed images were created and reviewed. Axial computed tomography images of the head/brain performed without intravenous contrast. Note: Per PQRS, the description of internal carotid artery percent stenosis, including 0 percent or normal exam, is based on [place] American Symptomatic Carotid Endarterectomy Trial (NASCET) criteria. Dose reduction technique was used including one or more of the following: automated exposure control, adjustment of mA and kV according to patient size, and/or iterative reconstruction. CONTRAST: 75 ml omnipaque  350 COMPARISON: Head CT 03/16/2024 and MRI 03/17/2024 FINDINGS: CT  HEAD: BRAIN: Scattered small hypodensities in the cerebral white matter, nonspecific but compatible with mild chronic small vessel ischemic disease. Mild generalized cerebral atrophy. No evidence of an acute infarct, intracranial hemorrhage, mass, midline shift, or extra-axial fluid collection. VENTRICLES: No hydrocephalus. ORBITS: The  orbits are unremarkable. SINUSES AND MASTOIDS: Chronic sinusitis with complete opacification of the left frontal, anterior ethmoid, and maxillary sinuses. Small volume petrous apex fluid bilaterally. CTA NECK: AORTIC ARCH: Common origin of the brachiocephalic and left common carotid arteries, a normal variant. Widely patent arch vessel origins. COMMON CAROTID ARTERIES: Mild atheromatous irregularity of both common carotid arteries without stenosis or dissection. INTERNAL CAROTID ARTERIES: Small amount of atherosclerotic plaque in the carotid bulbs without significant stenosis or dissection. VERTEBRAL ARTERIES: No significant stenosis. No dissection or occlusion. CTA HEAD: ANTERIOR CEREBRAL ARTERIES: No significant stenosis. No occlusion. No aneurysm. MIDDLE CEREBRAL ARTERIES: No significant stenosis. No occlusion. No aneurysm. POSTERIOR CEREBRAL ARTERIES: No significant stenosis. No occlusion. No aneurysm. BASILAR ARTERY: No significant stenosis. No occlusion. No aneurysm. OTHER: SOFT TISSUES: No acute finding. No masses or lymphadenopathy. BONES: Cervical spondylosis. Focally severe facet arthrosis on the right at C3-4 and C2-3. IMPRESSION: 1. No large vessel occlusion or significant stenosis in the head or neck. 2. No acute intracranial abnormality. 3. 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 Electronically signed by: Dasie Hamburg MD 03/17/2024 07:06 PM EDT RP Workstation: [id number]   ECHOCARDIOGRAM COMPLETE BUBBLE STUDY Result Date: 03/17/2024    ECHOCARDIOGRAM REPORT   Patient Name:   GEORGIE HAQUE Date of Exam: 03/17/2024 Medical Rec #:  [id number]       Height:        73.0 in Accession #:    [id number]      Weight:       163.4 lb Date of Birth:  1965-12-14      BSA:          1.974 m Patient Age:    57 years        BP:           141/103 mmHg Patient Gender: M               HR:           67 bpm. Exam Location:  Inpatient Procedure: 2D Echo (Both Spectral and Color Flow Doppler were utilized during            procedure). Indications:     stroke  History:         Patient has prior history of Echocardiogram examinations, most                  recent 08/25/2018. CHF, CAD, HIV; Risk Factors:Current Smoker                  and Hypertension.  Sonographer:     Tinnie Barefoot RDCS Referring Phys:  [id number] DELAYNE LULLA SOLIAN Diagnosing Phys: Evalene Lunger MD  Sonographer Comments: Uncooperative due to stroke. IMPRESSIONS  1. Left ventricular ejection fraction, by estimation, is 55 to 60%. Left ventricular ejection fraction by PLAX is 54 %. The left ventricle has normal function. The left ventricle has no regional wall motion abnormalities. Left ventricular diastolic parameters are consistent with Grade I diastolic dysfunction (impaired relaxation).  2. Right ventricular systolic function is normal. The right ventricular size is normal. There is normal pulmonary artery systolic pressure. The estimated right ventricular systolic pressure is 20.1 mmHg.  3. The mitral valve is normal in structure. No evidence of mitral valve regurgitation. No evidence of mitral stenosis.  4. The aortic valve is tricuspid. Aortic valve regurgitation is not visualized. No aortic stenosis is present.  5. The inferior vena cava is normal in size with greater than 50% respiratory variability, suggesting  right atrial pressure of 3 mmHg. FINDINGS  Left Ventricle: Left ventricular ejection fraction, by estimation, is 55 to 60%. Left ventricular ejection fraction by PLAX is 54 %. The left ventricle has normal function. The left ventricle has no regional wall motion abnormalities. Strain was performed and the global  longitudinal strain is indeterminate. The left ventricular internal cavity size was normal in size. There is no left ventricular hypertrophy. Left ventricular diastolic parameters are consistent with Grade I diastolic dysfunction  (impaired relaxation). Right Ventricle: The right ventricular size is normal. No increase in right ventricular wall thickness. Right ventricular systolic function is normal. There is normal pulmonary artery systolic pressure. The tricuspid regurgitant velocity is 2.07 m/s, and  with an assumed right atrial pressure of 3 mmHg, the estimated right ventricular systolic pressure is 20.1 mmHg. Left Atrium: Left atrial size was normal in size. Right Atrium: Right atrial size was normal in size. Pericardium: There is no evidence of pericardial effusion. Mitral Valve: The mitral valve is normal in structure. No evidence of mitral valve regurgitation. No evidence of mitral valve stenosis. Tricuspid Valve: The tricuspid valve is normal in structure. Tricuspid valve regurgitation is mild . No evidence of tricuspid stenosis. Aortic Valve: The aortic valve is tricuspid. Aortic valve regurgitation is not visualized. No aortic stenosis is present. Pulmonic Valve: The pulmonic valve was normal in structure. Pulmonic valve regurgitation is not visualized. No evidence of pulmonic stenosis. Aorta: The aortic root is normal in size and structure. Venous: The inferior vena cava is normal in size with greater than 50% respiratory variability, suggesting right atrial pressure of 3 mmHg. IAS/Shunts: No atrial level shunt detected by color flow Doppler. Agitated saline contrast was given intravenously to evaluate for intracardiac shunting. Additional Comments: 3D was performed not requiring image post processing on an independent workstation and was indeterminate.  LEFT VENTRICLE PLAX 2D LV EF:         Left            Diastology                ventricular     LV e' medial:    8.59 cm/s                ejection         LV E/e' medial:  9.0                fraction by     LV e' lateral:   10.90 cm/s                PLAX is 54      LV E/e' lateral: 7.1                %. LVIDd:         4.70 cm LVIDs:         3.40 cm LV PW:         1.10 cm LV IVS:        1.00 cm LVOT diam:     1.90 cm LV SV:         55 LV SV Index:   28 LVOT Area:     2.84 cm  RIGHT VENTRICLE             IVC RV Basal diam:  3.40 cm     IVC diam: 1.30 cm RV S prime:     14.00 cm/s TAPSE (M-mode): 2.2 cm LEFT ATRIUM  Index        RIGHT ATRIUM           Index LA diam:        2.80 cm 1.42 cm/m   RA Area:     14.20 cm LA Vol (A2C):   39.5 ml 20.01 ml/m  RA Volume:   37.90 ml  19.20 ml/m LA Vol (A4C):   42.8 ml 21.68 ml/m LA Biplane Vol: 41.9 ml 21.22 ml/m  AORTIC VALVE LVOT Vmax:   89.60 cm/s LVOT Vmean:  56.100 cm/s LVOT VTI:    0.193 m  AORTA Ao Root diam: 2.90 cm Ao Asc diam:  3.00 cm MITRAL VALVE               TRICUSPID VALVE MV Area (PHT): 3.99 cm    TR Peak grad:   17.1 mmHg MV Decel Time: 190 msec    TR Vmax:        207.00 cm/s MV E velocity: 77.00 cm/s MV A velocity: 91.20 cm/s  SHUNTS MV E/A ratio:  0.84        Systemic VTI:  0.19 m                            Systemic Diam: 1.90 cm Evalene Lunger MD Electronically signed by Evalene Lunger MD Signature Date/Time: 03/17/2024/5:05:35 PM    Final    EEG adult Result Date: 03/17/2024 Michaela Aisha SQUIBB, MD     03/17/2024  5:06 PM History: 58 year old with intermittent episodes of aphemia EEG Duration: 23 minutes Sedation: None Patient State: Awake and drowsy Technique: This EEG was acquired with electrodes placed according to the International 10-20 electrode system (including Fp1, Fp2, F3, F4, C3, C4, P3, P4, O1, O2, T3, T4, T5, T6, A1, A2, Fz, Cz, Pz). The following electrodes were missing or displaced: none. Background: The background consists of intermixed alpha and beta activities. There is a well defined posterior dominant rhythm of 9-10 Hz that attenuates with eye opening.  During the  recording he had a single episode characterized by psychomotor agitation and refusal to speak.  During the episode, there is no significant change to the underlying rhythms until the end at which point there is a suggestion of a left-sided rhythmic activity that appears artifactual in nature without any evolution.  He is moving his arm at the time, and I wonder if this is movement related.  The same pattern is seen subsequently when the patient is asymptomatic as well.  There was no definite epileptiform discharge. Photic stimulation: Physiologic driving is not performed EEG Abnormalities: No definite abnormalities Clinical Interpretation: This normal EEG is recorded in the waking and drowsy state. There was no seizure or seizure predisposition recorded on this study. Please note that lack of epileptiform activity on EEG does not preclude the possibility of epilepsy. Though there was no definite evidence of epileptic activity, if the patient continues to have episodes, could consider transfer for longer-term monitoring but at the current time I do not think that this study supports an epileptic nature to his events. Aisha Michaela, MD [facility] If 7pm- 7am, please page neurology on call as listed in AMION.  MR Brain W and Wo Contrast Result Date: 03/17/2024 CLINICAL DATA:  58 year old male with altered mental status. CHF, hypertension, TIA. EXAM: MRI HEAD WITHOUT AND WITH CONTRAST TECHNIQUE: Multiplanar, multiecho pulse sequences of the brain and surrounding structures were obtained without and with intravenous contrast. CONTRAST:  7mL GADAVIST  GADOBUTROL  1 MMOL/ML IV SOLN COMPARISON:  Head CT yesterday. FINDINGS: Brain: No restricted diffusion to suggest acute infarction. No midline shift, mass effect, evidence of mass lesion, ventriculomegaly, extra-axial collection or acute intracranial hemorrhage. Cervicomedullary junction and pituitary are within normal limits. Scattered cerebral white  matter T2 and FLAIR hyperintensity, mildly advanced for age in most notable in the right centrum semiovale (series 8, image 36). No cortical encephalomalacia identified. No chronic cerebral blood products on SWI. Cerebral volume is within normal limits for age. Deep gray nuclei, brainstem and cerebellum are within normal limits for age. Vascular: Major intracranial vascular flow voids are preserved. And following contrast the major dural venous sinuses are enhancing and appear to be patent. Skull and upper cervical spine: Visualized bone marrow signal is within normal limits. Negative visible cervical spine. Sinuses/Orbits: Negative orbits. Heterogeneous complete opacification of the left frontal, anterior ethmoid, and maxillary sinuses. Periosteal thickening demonstrated on CT. Minor paranasal sinus mucosal thickening otherwise. Small volume retained secretions in the nasopharynx. Other: Trace bilateral petrous apex air cell fluid, mastoids generally well aerated. Grossly normal visible internal auditory structures. IMPRESSION: 1. No acute intracranial abnormality. 2. Mildly age advanced cerebral white matter signal changes, nonspecific but most commonly due to chronic small vessel disease. 3. 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 Electronically Signed   By: VEAR Hurst M.D.   On: 03/17/2024 04:13   DG Chest 1 View Result Date: 03/16/2024 CLINICAL DATA:  Altered mental status with unbalanced gait. EXAM: CHEST  1 VIEW COMPARISON:  June 29, 2017 FINDINGS: The heart size and mediastinal contours are within normal limits. Both lungs are clear. The visualized skeletal structures are unremarkable. IMPRESSION: No active disease. Electronically Signed   By: Suzen Dials M.D.   On: 03/16/2024 23:45   CT HEAD WO CONTRAST Result Date: 03/16/2024 CLINICAL DATA:  Intermittent aphasia EXAM: CT HEAD WITHOUT CONTRAST TECHNIQUE: Contiguous axial images were obtained from the base of  the skull through the vertex without intravenous contrast. RADIATION DOSE REDUCTION: This exam was performed according to the departmental dose-optimization program which includes automated exposure control, adjustment of the mA and/or kV according to patient size and/or use of iterative reconstruction technique. COMPARISON:  None Available. FINDINGS: Brain: No evidence of acute infarction, hemorrhage, hydrocephalus, extra-axial collection or mass lesion/mass effect. Mild atrophic changes are noted. Vascular: No hyperdense vessel or unexpected calcification. Skull: Normal. Negative for fracture or focal lesion. Sinuses/Orbits: Opacification of the left frontal sinus is noted. Other: None. IMPRESSION: Chronic changes without acute abnormality. Electronically Signed   By: Oneil Devonshire M.D.   On: 03/16/2024 23:19    Micro Results    No results found for this or any previous visit (from the past 240 hours).  Today   Subjective    Marcquis Ridlon today has no headache,no chest abdominal pain,no new weakness tingling or numbness, feels much better wants to go home today.    Objective   Blood pressure 108/75, pulse 74, temperature 98.1 F (36.7 C), temperature source Oral, resp. rate 18, height 6' 1 (1.854 m), SpO2 94%.   Intake/Output Summary (Last 24 hours) at 03/21/2024 0940 Last data filed at 03/21/2024 0021 Gross per 24 hour  Intake --  Output 325 ml  Net -325 ml    Exam  Awake Alert, No new F.N deficits,    [place].AT,PERRAL Supple Neck,   Symmetrical Chest wall movement, Good air movement bilaterally, CTAB RRR,No Gallops,   +ve B.Sounds, Abd Soft, Non  tender,  No Cyanosis, Clubbing or edema    Data Review   Recent Labs  Lab 03/16/24 2200 03/16/24 2205 03/18/24 0903 03/19/24 0556  WBC 7.8 7.3 6.3 4.8  HGB 11.8* 11.6* 11.4* 11.2*  HCT 35.8* 36.1* 34.5* 32.8*  PLT 366 353 321 324  MCV 99.7 104* 98.0 97.9  MCH 32.9 33.5* 32.4 33.4  MCHC 33.0 32.1 33.0 34.1  RDW 13.3 12.7  13.0 13.0  LYMPHSABS 1.1 1.3 1.1  --   MONOABS 0.5  --  0.6  --   EOSABS 0.0 0.0 0.2  --   BASOSABS 0.1 0.1 0.1  --     Recent Labs  Lab 03/16/24 2200 03/16/24 2204 03/18/24 0903 03/19/24 0556 03/19/24 0557 03/20/24 0631 03/21/24 0625  NA 141  --  140  --  140 138 138  K 3.3*  --  3.4*  --  3.9 3.7 3.8  CL 104  --  102  --  107 105 107  CO2 27  --  28  --  23 23 23   ANIONGAP 10  --  10  --  10 10 8   GLUCOSE 148*  --  123*  --  118* 127* 136*  BUN 13  --  14  --  19 18 18   CREATININE 1.21  --  1.33* 1.30* 1.32* 1.14 1.18  AST 43*  --   --   --   --   --   --   ALT 18  --   --   --   --   --   --   ALKPHOS 53  --   --   --   --   --   --   BILITOT 0.9  --   --   --   --   --   --   ALBUMIN 3.9  --   --   --   --   --   --   INR 1.0  --   --   --   --   --   --   TSH  --   --   --  4.651*  --   --   --   HGBA1C  --  5.1  --   --   --   --   --   MG  --   --  2.0  --  2.0 2.0 2.1  PHOS  --   --  4.2  --   --   --   --   CALCIUM  9.2  --  9.0  --  9.1 9.0 9.2    Total Time in preparing paper work, data evaluation and todays exam - 35 minutes  Signature  -    Lavada Stank M.D on 03/21/2024 at 9:40 AM   -  To page go to [email or web address]

## 2024-03-21 NOTE — Progress Notes (Signed)
 DC order noted per MD. DC RN at bedside. Primary RN removed PIV. AVS printed/reviewed with patient with instruction for close f/u with providers and importance of med compliance. Skin intact. All belongings accounted for. Patient ruled inappropriate for dc lounge, pt to wait in assigned room until nephew arrives for pickup from [place].

## 2024-03-21 NOTE — Progress Notes (Signed)
 NEUROLOGY CONSULT FOLLOW UP NOTE   Date of service: March 21, 2024 Patient Name: Manuel Rosales MRN:  [id number] DOB:  10/19/1965  Interval Hx/subjective  Patient remains hemodynamically stable and afebrile.  Speech is normal this morning.  LTM EEG has been negative, and no electrographic seizures were seen when multiple events were flagged overnight.  Vitals   Vitals:   03/21/24 0743 03/21/24 0807 03/21/24 0840 03/21/24 0913  BP:   108/75 108/75  Pulse:   74   Resp:   18   Temp:   98.1 F (36.7 C)   TempSrc:   Oral   SpO2: (!) 84% (!) 88% 94%   Height:         Body mass index is 19.95 kg/m.  Physical Exam   Constitutional: Appears well-developed and well-nourished.  Psych: Affect appropriate to situation.  Eyes: No scleral injection.  HENT: No OP obstrucion.  Head: Normocephalic.  Cardiovascular: Normal rate and regular rhythm.  Respiratory: Effort normal, non-labored breathing.  Skin: WDI.   Neurologic Examination   Mental Status: AA&Ox3  Speech/Language: speech is without dysarthria or aphasia.  Naming, repetition, fluency, and comprehension intact. Cranial Nerves:  II: PERRL.  III, IV, VI: EOMI. Eyelids elevate symmetrically.  V: Sensation is intact to light touch and symmetrical to face.  VII: Smile is symmetrical.  VIII: hearing intact to voice. IX, X: Phonation is normal.  XII: tongue is midline without fasciculations. Motor: 5/5 strength to all muscle groups tested.  Tone is normal and bulk is normal Sensation- Intact to light touch bilaterally.  Coordination: FTN intact bilaterally Gait- Deferred   Medic.ations  Current Facility-Administered Medications:    amLODipine  (NORVASC ) tablet 5 mg, 5 mg, Oral, Daily, Singh, Prashant K, MD, 5 mg at 03/21/24 0913   aspirin  EC tablet 81 mg, 81 mg, Oral, Daily, Franky Redia SAILOR, MD, 81 mg at 03/21/24 0912   atorvastatin  (LIPITOR) tablet 40 mg, 40 mg, Oral, Daily, Kakrakandy, Arshad N, MD, 40 mg at  03/21/24 9087   bictegravir-emtricitabine -tenofovir  AF (BIKTARVY ) 50-200-25 MG per tablet 1 tablet, 1 tablet, Oral, Daily, Franky Redia SAILOR, MD, 1 tablet at 03/21/24 9087   carvedilol  (COREG ) tablet 6.25 mg, 6.25 mg, Oral, BID WC, Kakrakandy, Arshad N, MD, 6.25 mg at 03/20/24 1833   cyanocobalamin  (VITAMIN B12) tablet 1,000 mcg, 1,000 mcg, Oral, Daily, Franky Redia SAILOR, MD, 1,000 mcg at 03/21/24 0913   enoxaparin  (LOVENOX ) injection 40 mg, 40 mg, Subcutaneous, QHS, Franky Redia SAILOR, MD, 40 mg at 03/20/24 2158   folic acid  (FOLVITE ) tablet 1 mg, 1 mg, Oral, Daily, Franky Redia SAILOR, MD, 1 mg at 03/21/24 0912   insulin  aspart (novoLOG ) injection 0-9 Units, 0-9 Units, Subcutaneous, TID WC, Franky Redia SAILOR, MD, 3 Units at 03/21/24 0913   multivitamin with minerals tablet 1 tablet, 1 tablet, Oral, Daily, Singh, Prashant K, MD, 1 tablet at 03/20/24 2159   thiamine  (VITAMIN B1) tablet 100 mg, 100 mg, Oral, Daily, 100 mg at 03/21/24 0913 **OR** [DISCONTINUED] thiamine  (VITAMIN B1) injection 100 mg, 100 mg, Intravenous, Daily, Franky Redia SAILOR, MD  Labs and Diagnostic Imaging   CBC:  Recent Labs  Lab 03/16/24 2205 03/18/24 0903 03/19/24 0556  WBC 7.3 6.3 4.8  NEUTROABS 5.3 4.2  --   HGB 11.6* 11.4* 11.2*  HCT 36.1* 34.5* 32.8*  MCV 104* 98.0 97.9  PLT 353 321 324    Basic Metabolic Panel:  Lab Results  Component Value Date   NA 138 03/21/2024   K  3.8 03/21/2024   CO2 23 03/21/2024   GLUCOSE 136 (H) 03/21/2024   BUN 18 03/21/2024   CREATININE 1.18 03/21/2024   CALCIUM  9.2 03/21/2024   GFRNONAA >60 03/21/2024   GFRAA 39 (L) 10/03/2017   Lipid Panel:  Lab Results  Component Value Date   LDLCALC 40 03/17/2024   HgbA1c:  Lab Results  Component Value Date   HGBA1C 5.1 03/16/2024   Urine Drug Screen: No results found for: LABOPIA, COCAINSCRNUR, LABBENZ, AMPHETMU, THCU, LABBARB  Alcohol Level     Component Value Date/Time   ETH <15 03/16/2024  2200   INR  Lab Results  Component Value Date   INR 1.0 03/16/2024   APTT  Lab Results  Component Value Date   APTT 23 (L) 03/16/2024   CT Head without contrast (Personally reviewed): No acute abnormality   CT angio Head and Neck with contrast (Personally reviewed): No LVO or hemodynamically significant stenosis   MRI Brain (Personally reviewed): No acute abnormality  Continuous EEG 8/15 to 8/16:  This study is within normal limits. No seizures or epileptiform discharges were seen throughout the recording. A normal interictal EEG does not exclude the diagnosis of epilepsy.  Continuous EEG 8/16 to 8/17: This study is within normal limits. No seizures or epileptiform discharges were seen throughout the recording. Event button was pressed on 03/20/2024 at 1538, 1608, 2206, 2246 and on 03/21/2024 at 0016, 0319, 0353 and 0743 for unclear reasons without concomitant eeg change. These events were most likely NON epileptic.  Assessment   [name] is a 58 y.o. male with a history of HIV, CHF, CAD and anemia who initially presented with difficulty speaking.  He has been having episodes where he cannot physically speak the words that he wants to speak but is able to understand what is being said and can communicate through gestures and writing, which suggests episodes of transient aphemia.  Imaging was negative for acute abnormalities, and continuous EEG overnight demonstrates no seizure activity.  As patient states that he has had some episodes during that time, this increases the likelihood that these are nonepileptic and possibly functional in nature.  However, a deep seizure focus could also result in focal seizure activity that is not detectable by surface-electrode EEG.  LTM was continued for an additional day, and multiple episodes were flagged.  These were deemed to be nonepileptic, and patient's speech deficit is likely functional in nature.  Recommendations  - Discontinue LTM  EEG - Outpatient Psychiatry follow-up - Neurology will sign off, please call with questions or concerns ______________________________________________________________________ Patient seen by NP and then by MD, MD to edit note as needed. Signed, Cortney E Everitt Clint Kill, NP [facility]   Electronically signed: Dr. Tanairi Cypert

## 2024-03-21 NOTE — Progress Notes (Signed)
 LTM EEG disconnected - no skin breakdown at [facility]. [facility] notified.

## 2024-03-22 ENCOUNTER — Telehealth: Payer: Self-pay | Admitting: Family

## 2024-03-22 NOTE — Telephone Encounter (Signed)
 Called to confirm/remind patient of their appointment at the [facility] on 03/23/24.   Appointment:   [x] Confirmed  [] Left mess   [] No answer/No voice mail  [] VM Full/unable to leave message  [] Phone not in service  Patient reminded to bring all medications and/or complete list.  Confirmed patient has transportation. Gave directions, instructed to utilize valet parking.
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
## 2024-03-23 ENCOUNTER — Telehealth: Payer: Self-pay | Admitting: Family

## 2024-03-23 ENCOUNTER — Encounter: Admitting: Family

## 2024-03-23 NOTE — Telephone Encounter (Signed)
 Patient did not show for his Heart Failure Clinic appointment on 03/23/24. This is the 11th appointment that he has missed.

## 2024-04-08 ENCOUNTER — Telehealth: Payer: Self-pay | Admitting: Family

## 2024-04-08 ENCOUNTER — Other Ambulatory Visit: Payer: Self-pay | Admitting: Family
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
## 2024-04-08 NOTE — Telephone Encounter (Signed)
 Called to confirm/remind patient of their appointment at the [facility] on 04/09/24.   Appointment:   [x] Confirmed  [] Left mess   [] No answer/No voice mail  [] VM Full/unable to leave message  [] Phone not in service  Patient reminded to bring all medications and/or complete list.  Confirmed patient has transportation. Gave directions, instructed to utilize valet parking.

## 2024-04-09 ENCOUNTER — Encounter: Admitting: Family

## 2024-04-09 ENCOUNTER — Telehealth: Payer: Self-pay | Admitting: Family

## 2024-04-09 MED ORDER — FUROSEMIDE 20 MG PO TABS: 20.0000 mg | ORAL_TABLET | Freq: Every day | ORAL | 6 refills | Status: DC

## 2024-04-09 NOTE — Telephone Encounter (Signed)
Medication refilled to preferred pharmacy

## 2024-04-09 NOTE — Telephone Encounter (Signed)
 Patient did not show for his [facility] appointment on 04/09/24. This is the 12th appointment that he has missed.

## 2024-05-13 ENCOUNTER — Other Ambulatory Visit: Payer: Self-pay | Admitting: Family

## 2024-05-13 ENCOUNTER — Telehealth: Payer: Self-pay | Admitting: Family

## 2024-05-13 NOTE — Telephone Encounter (Signed)
 Called to confirm/remind patient of their appointment at the [facility] on 05/14/24.   Appointment:   [x] Confirmed  [] Left mess   [] No answer/No voice mail  [] VM Full/unable to leave message  [] Phone not in service  Patient reminded to bring all medications and/or complete list.  Confirmed patient has transportation. Gave directions, instructed to utilize valet parking.
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
## 2024-05-14 ENCOUNTER — Encounter: Admitting: Family

## 2024-05-31 ENCOUNTER — Telehealth: Payer: Self-pay | Admitting: Family

## 2024-05-31 NOTE — Telephone Encounter (Signed)
 Called to confirm/remind patient of their appointment at the [facility] on 06/01/24.   Appointment:   [x] Confirmed  [] Left mess   [] No answer/No voice mail  [] VM Full/unable to leave message  [] Phone not in service  Patient reminded to bring all medications and/or complete list.  Confirmed patient has transportation. Gave directions, instructed to utilize valet parking.

## 2024-06-01 ENCOUNTER — Encounter: Payer: Self-pay | Admitting: Family

## 2024-06-01 ENCOUNTER — Ambulatory Visit: Attending: Family | Admitting: Family

## 2024-06-01 ENCOUNTER — Other Ambulatory Visit (HOSPITAL_COMMUNITY): Payer: Self-pay

## 2024-06-01 ENCOUNTER — Telehealth (HOSPITAL_COMMUNITY): Payer: Self-pay

## 2024-06-01 VITALS — BP 159/83 | HR 76 | Wt 165.0 lb

## 2024-06-01 DIAGNOSIS — I110 Hypertensive heart disease with heart failure: Secondary | ICD-10-CM | POA: Insufficient documentation

## 2024-06-01 DIAGNOSIS — Z720 Tobacco use: Secondary | ICD-10-CM

## 2024-06-01 DIAGNOSIS — E785 Hyperlipidemia, unspecified: Secondary | ICD-10-CM | POA: Insufficient documentation

## 2024-06-01 DIAGNOSIS — F17210 Nicotine dependence, cigarettes, uncomplicated: Secondary | ICD-10-CM | POA: Diagnosis not present

## 2024-06-01 DIAGNOSIS — E782 Mixed hyperlipidemia: Secondary | ICD-10-CM | POA: Diagnosis not present

## 2024-06-01 DIAGNOSIS — I5032 Chronic diastolic (congestive) heart failure: Secondary | ICD-10-CM | POA: Insufficient documentation

## 2024-06-01 DIAGNOSIS — I252 Old myocardial infarction: Secondary | ICD-10-CM | POA: Insufficient documentation

## 2024-06-01 DIAGNOSIS — Z21 Asymptomatic human immunodeficiency virus [HIV] infection status: Secondary | ICD-10-CM | POA: Insufficient documentation

## 2024-06-01 DIAGNOSIS — I10 Essential (primary) hypertension: Secondary | ICD-10-CM

## 2024-06-01 DIAGNOSIS — Z79899 Other long term (current) drug therapy: Secondary | ICD-10-CM | POA: Diagnosis not present

## 2024-06-01 DIAGNOSIS — I2510 Atherosclerotic heart disease of native coronary artery without angina pectoris: Secondary | ICD-10-CM | POA: Insufficient documentation

## 2024-06-01 MED ORDER — FUROSEMIDE 20 MG PO TABS: 20.0000 mg | ORAL_TABLET | Freq: Every morning | ORAL | 5 refills | Status: AC

## 2024-06-01 MED ORDER — ATORVASTATIN CALCIUM 40 MG PO TABS: 40.0000 mg | ORAL_TABLET | Freq: Every day | ORAL | 1 refills | Status: AC

## 2024-06-01 MED ORDER — SACUBITRIL-VALSARTAN 49-51 MG PO TABS: 1.0000 | ORAL_TABLET | Freq: Two times a day (BID) | ORAL | 3 refills | Status: AC

## 2024-06-01 MED ORDER — CARVEDILOL 6.25 MG PO TABS: 6.2500 mg | ORAL_TABLET | Freq: Two times a day (BID) | ORAL | 5 refills | Status: AC

## 2024-06-01 MED ORDER — DAPAGLIFLOZIN PROPANEDIOL 10 MG PO TABS: 10.0000 mg | ORAL_TABLET | Freq: Every day | ORAL | 11 refills | Status: AC

## 2024-06-01 MED ORDER — FUROSEMIDE 20 MG PO TABS: 20.0000 mg | ORAL_TABLET | Freq: Every morning | ORAL | 0 refills | Status: DC

## 2024-06-01 NOTE — Patient Instructions (Signed)
 Medication Changes:  START Farxiga 10mg  daily before breakfast  Testing/Procedures:  Your physician has requested that you have an echocardiogram. Echocardiography is a painless test that uses sound waves to create images of your heart. It provides your doctor with information about the size and shape of your heart and how well your heart's chambers and valves are working. This procedure takes approximately one hour. There are no restrictions for this procedure. Please do NOT wear cologne, perfume, aftershave, or lotions (deodorant is allowed). Please arrive 15 minutes prior to your appointment time.  Please note: We ask at that you not bring children with you during ultrasound (echo/ vascular) testing. Due to room size and safety concerns, children are not allowed in the ultrasound rooms during exams. Our front office staff cannot provide observation of children in our lobby area while testing is being conducted. An adult accompanying a patient to their appointment will only be allowed in the ultrasound room at the discretion of the ultrasound technician under special circumstances. We apologize for any inconvenience.  Someone will be in contact with you in order to schedule your appointment.    Follow-Up in: Please follow up with the [facility] in 1 month with Ellouise Class, FNP.   Thank you for choosing [facility].    At the [facility], you and your health needs are our priority. We have a designated team specialized in the treatment of Heart Failure. This Care Team includes your primary Heart Failure Specialized Cardiologist (physician), Advanced Practice Providers (APPs- Physician Assistants and Nurse Practitioners), and Pharmacist who all work together to provide you with the care you need, when you need it.   You may see any of the following providers on your designated Care Team at your next follow up:  Dr.  Toribio Fuel Dr. Ezra Shuck Dr. Ria Commander Dr. Morene Brownie Ellouise Class, FNP Jaun Bash, RPH-CPP  Please be sure to bring in all your medications bottles to every appointment.   Need to Contact Us :  If you have any questions or concerns before your next appointment please send us  a message through [place] or call our office at [phone].    TO LEAVE A MESSAGE FOR THE NURSE SELECT OPTION 2, PLEASE LEAVE A MESSAGE INCLUDING: YOUR NAME DATE OF BIRTH CALL BACK NUMBER REASON FOR CALL**this is important as we prioritize the call backs  YOU WILL RECEIVE A CALL BACK THE SAME DAY AS LONG AS YOU CALL BEFORE 4:00 PM

## 2024-06-01 NOTE — Telephone Encounter (Signed)
 Advanced Heart Failure Patient Advocate Encounter  Test billing for this patient's current coverage [facility]) returns a $4 copay for 90 day supply of Farxiga.  This test claim was processed through [facility]- copay amounts may vary at other pharmacies due to pharmacy/plan contracts, or as the patient moves through the different stages of their insurance plan.  Rachel DEL, CPhT Rx Patient Advocate Phone: [phone]
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
## 2024-06-28 ENCOUNTER — Telehealth (HOSPITAL_COMMUNITY): Payer: Self-pay | Admitting: Licensed Clinical Social Worker

## 2024-06-28 NOTE — Telephone Encounter (Signed)
 CSW attempted to contact pt regarding transportation to upcoming clinic appt- unable to reach or leave VM  Manuel HILARIO Leech, LCSW Clinical Social Worker [facility] Desk#: [phone] Cell#: [phone]

## 2024-06-28 NOTE — Telephone Encounter (Signed)
 H&V Care Navigation CSW Progress Note  Clinical Social Worker called pt to check on transportation for next weeks visit.  Pt states he has ride to that appt.  Encouraged him to reach out if this falls through or he needs help utilizing his Medicaid transport- states he has used this before so is aware how to if needed.  SDOH Screenings   Food Insecurity: Unknown (03/19/2024)  Housing: Low Risk  (03/18/2024)  Transportation Needs: No Transportation Needs (03/18/2024)  Utilities: Not At Risk (03/18/2024)  Depression (PHQ2-9): Low Risk  (02/22/2022)  Financial Resource Strain: Low Risk  (02/22/2022)  Physical Activity: Inactive (06/18/2017)  Social Connections: Moderately Isolated (06/18/2017)  Stress: Stress Concern Present (06/18/2017)  Tobacco Use: High Risk (06/01/2024)   Andriette HILARIO Leech, LCSW Clinical Social Worker [facility] Desk#: [phone] Cell#: [phone]

## 2024-07-06 ENCOUNTER — Telehealth: Payer: Self-pay | Admitting: Family
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
## 2024-07-06 NOTE — Telephone Encounter (Signed)
 Called to confirm/remind patient of their appointment at the Advanced Heart Failure Clinic on 07/07/24.   Appointment:   [x] Confirmed  [] Left mess   [] No answer/No voice mail  [] VM Full/unable to leave message  [] Phone not in service  Patient reminded to bring all medications and/or complete list.  Confirmed patient has transportation. Gave directions, instructed to utilize valet parking.

## 2024-07-07 ENCOUNTER — Ambulatory Visit: Admitting: Family

## 2024-07-07 ENCOUNTER — Telehealth: Payer: Self-pay | Admitting: Family

## 2024-07-07 NOTE — Telephone Encounter (Signed)
 Patient did not show for his Heart Failure Clinic appointment on 07/07/24. This is the 13th missed appointment.

## 2024-07-26 ENCOUNTER — Ambulatory Visit

## 2024-08-01 NOTE — Progress Notes (Deleted)
 Cardiology Office Note  Date:  08/01/2024   ID:  Manuel, Rosales September 13, 1965, MRN [id number]  PCP:  [facility]   No chief complaint on file.   HPI:  Manuel Rosales is a 58 y.o. male with past medical history of: Past Medical History:  Diagnosis Date   Chronic systolic CHF (congestive heart failure) (HCC) 06/2017   a. TTE 11/18: EF of 20-25% , difuse hypokinesis, mild to moderate tricuspid regurgitation, and mild pulmonary hypertension   Coronary artery disease 06/2017   Non-ST elevation myocardial infarction.  Treated medically with no cardiac cath done due to active infection and renal failure.   Dermatitis    HIV (human immunodeficiency virus infection) (HCC)    Hypertension    Pulmonary hypertension (HCC)    a. TTE 11/18: EF of 20-25% , difuse hypokinesis, mild to moderate tricuspid regurgitation, and mild pulmonary hypertension w/ PASP 40 mmHg  HIV Who presents by referral from [facility] for chronic systolic CHF with normalized EF August 2025  Last seen by cardiology March 2019 Notes indicating history of cardiomyopathy dating back to 2018 EF 20 to 25% At that time with psoas/iliac muscle abscess Cardiac catheterization was not performed due to active infection and acute renal failure   Echo 08/25/2018: EF of 50-55%.  Echo 03/17/24: EF 55-60%, G1DD, normal RV   Smokes 4 cigarettes a day       PMH:   has a past medical history of Chronic systolic CHF (congestive heart failure) (HCC) (06/2017), Coronary artery disease (06/2017), Dermatitis, HIV (human immunodeficiency virus infection) (HCC), Hypertension, and Pulmonary hypertension (HCC).   PSH:    Past Surgical History:  Procedure Laterality Date   APPENDECTOMY     IR RADIOLOGIST EVAL & MGMT  06/19/2017    Current Outpatient Medications  Medication Sig Dispense Refill   aspirin  81 MG EC tablet Take 1 tablet (81 mg total) by mouth daily. 90 tablet 3   atorvastatin  (LIPITOR) 40 MG tablet  Take 1 tablet (40 mg total) by mouth daily. 90 tablet 1   bictegravir-emtricitabine -tenofovir  AF (BIKTARVY ) 50-200-25 MG TABS tablet Take 1 tablet by mouth daily.     carvedilol  (COREG ) 6.25 MG tablet Take 1 tablet (6.25 mg total) by mouth 2 (two) times daily. Keep appt with HF Clinic for refills. 60 tablet 5   dapagliflozin  propanediol (FARXIGA ) 10 MG TABS tablet Take 1 tablet (10 mg total) by mouth daily before breakfast. 30 tablet 11   furosemide  (LASIX ) 20 MG tablet Take 1 tablet (20 mg total) by mouth every morning. 30 tablet 5   glipiZIDE (GLUCOTROL) 5 MG tablet Take 5 mg by mouth daily before breakfast.     metFORMIN (GLUCOPHAGE) 1000 MG tablet Take 1,000 mg by mouth 2 (two) times daily with a meal.     nitroGLYCERIN  (NITROSTAT ) 0.4 MG SL tablet Place 1 tablet (0.4 mg total) under the tongue every 5 (five) minutes as needed for chest pain. 30 tablet 1   sacubitril -valsartan  (ENTRESTO ) 49-51 MG Take 1 tablet by mouth 2 (two) times daily. 180 tablet 3   No current facility-administered medications for this visit.     Allergies:   Patient has no known allergies.   Social History:  The patient  reports that he has been smoking cigarettes. He has been smoking an average of 0.5 packs per day. He has never used smokeless tobacco. He reports current alcohol use of about 2.0 standard drinks of alcohol per week. He reports that he  does not currently use drugs after having used the following drugs: Marijuana.   Family History:   family history includes Cerebral aneurysm in his brother; Diabetes in his mother; Heart attack in his father; Hypertension in his mother.    Review of Systems: ROS   PHYSICAL EXAM: VS:  There were no vitals taken for this visit. , BMI There is no height or weight on file to calculate BMI. GEN: Well nourished, well developed, in no acute distress HEENT: normal Neck: no JVD, carotid bruits, or masses Cardiac: RRR; no murmurs, rubs, or gallops,no edema  Respiratory:   clear to auscultation bilaterally, normal work of breathing GI: soft, nontender, nondistended, + BS MS: no deformity or atrophy Skin: warm and dry, no rash Neuro:  Strength and sensation are intact Psych: euthymic mood, full affect    Recent Labs: 03/16/2024: ALT 18 03/19/2024: Hemoglobin 11.2; Platelets 324; TSH 4.651 03/21/2024: BUN 18; Creatinine, Ser 1.18; Magnesium 2.1; Potassium 3.8; Sodium 138    Lipid Panel Lab Results  Component Value Date   CHOL 120 03/17/2024   HDL 59 03/17/2024   LDLCALC 40 03/17/2024   TRIG 107 03/17/2024      Wt Readings from Last 3 Encounters:  06/01/24 165 lb (74.8 kg)  03/17/24 151 lb 3.8 oz (68.6 kg)  10/30/23 163 lb 6 oz (74.1 kg)       ASSESSMENT AND PLAN:  Problem List Items Addressed This Visit   None    Disposition:   F/U  12 months   Total encounter time more than 30 minutes  Greater than 50% was spent in counseling and coordination of care with the patient    Signed, Velinda Lunger, M.D., Ph.D. [facility] [place], [place] [phone]

## 2024-08-03 ENCOUNTER — Ambulatory Visit: Attending: Cardiovascular Disease | Admitting: Cardiovascular Disease

## 2024-08-03 DIAGNOSIS — I5032 Chronic diastolic (congestive) heart failure: Secondary | ICD-10-CM

## 2024-08-03 DIAGNOSIS — E119 Type 2 diabetes mellitus without complications: Secondary | ICD-10-CM

## 2024-08-03 DIAGNOSIS — I10 Essential (primary) hypertension: Secondary | ICD-10-CM

## 2024-08-03 DIAGNOSIS — B20 Human immunodeficiency virus [HIV] disease: Secondary | ICD-10-CM

## 2024-08-03 DIAGNOSIS — Z720 Tobacco use: Secondary | ICD-10-CM

## 2024-09-05 NOTE — Progress Notes (Unsigned)
 Cardiology Office Note  Date:  09/05/2024   ID:  Manuel Rosales 1966-02-04, MRN [id number]  PCP:  [facility]   No chief complaint on file.   HPI:  Manuel Rosales is a 59 y.o. male with past medical history of: Past Medical History:  Diagnosis Date   Chronic systolic CHF (congestive heart failure) (HCC) 06/2017   a. TTE 11/18: EF of 20-25% , difuse hypokinesis, mild to moderate tricuspid regurgitation, and mild pulmonary hypertension   Coronary artery disease 06/2017   Non-ST elevation myocardial infarction.  Treated medically with no cardiac cath done due to active infection and renal failure.   Dermatitis    HIV (human immunodeficiency virus infection) (HCC)    Hypertension    Pulmonary hypertension (HCC)    a. TTE 11/18: EF of 20-25% , difuse hypokinesis, mild to moderate tricuspid regurgitation, and mild pulmonary hypertension w/ PASP 40 mmHg  CAD/ NSTEMI 06/2017, pulmonary HTN, HIV, HTN, psoas abscess,   Who presents by referral from [facility] for chronic diastolic CHF  EF 20 to 25% in 88/7981 Cardiac catheterization was not performed due to active infection and acute renal failure.  -underwent CT-guided drainage of the abscess   EF 50 to 55% in 08/2018 EF 55% in 8/25 Admitted 03/16/24 with intermittent spells of word finding difficulties, unbalanced gait, and slow response. While in the ED he had several episodes of word finding difficulty associated with staring lasting just a few seconds and then will be back to baseline. EKG with sinus rhythm at 84 chest x-ray nonacute. Head CT with chronic changes without acute abnormality. MRI brain with and without contrast no acute intracranial abnormality. CTA head/ neck without significant stenosis/ occlusion. Neurology consulted. Transferred to [facility] for continuous EEG monitoring. Psych consulted. Echo 03/17/24: EF 55-60%, G1DD, normal RV    Smoking 3-4 cigarettes daily and tends to smoke at mealtime.  Occasional beer and denies drug use.     PMH:   has a past medical history of Chronic systolic CHF (congestive heart failure) (HCC) (06/2017), Coronary artery disease (06/2017), Dermatitis, HIV (human immunodeficiency virus infection) (HCC), Hypertension, and Pulmonary hypertension (HCC).   PSH:    Past Surgical History:  Procedure Laterality Date   APPENDECTOMY     IR RADIOLOGIST EVAL & MGMT  06/19/2017    Current Outpatient Medications  Medication Sig Dispense Refill   aspirin  81 MG EC tablet Take 1 tablet (81 mg total) by mouth daily. 90 tablet 3   atorvastatin  (LIPITOR) 40 MG tablet Take 1 tablet (40 mg total) by mouth daily. 90 tablet 1   bictegravir-emtricitabine -tenofovir  AF (BIKTARVY ) 50-200-25 MG TABS tablet Take 1 tablet by mouth daily.     carvedilol  (COREG ) 6.25 MG tablet Take 1 tablet (6.25 mg total) by mouth 2 (two) times daily. Keep appt with HF Clinic for refills. 60 tablet 5   dapagliflozin  propanediol (FARXIGA ) 10 MG TABS tablet Take 1 tablet (10 mg total) by mouth daily before breakfast. 30 tablet 11   furosemide  (LASIX ) 20 MG tablet Take 1 tablet (20 mg total) by mouth every morning. 30 tablet 5   glipiZIDE (GLUCOTROL) 5 MG tablet Take 5 mg by mouth daily before breakfast.     metFORMIN (GLUCOPHAGE) 1000 MG tablet Take 1,000 mg by mouth 2 (two) times daily with a meal.     nitroGLYCERIN  (NITROSTAT ) 0.4 MG SL tablet Place 1 tablet (0.4 mg total) under the tongue every 5 (five) minutes as needed for chest  pain. 30 tablet 1   sacubitril -valsartan  (ENTRESTO ) 49-51 MG Take 1 tablet by mouth 2 (two) times daily. 180 tablet 3   No current facility-administered medications for this visit.     Allergies:   Patient has no known allergies.   Social History:  The patient  reports that he has been smoking cigarettes. He has been smoking an average of 0.5 packs per day. He has never used smokeless tobacco. He reports current alcohol use of about 2.0 standard drinks of alcohol  per week. He reports that he does not currently use drugs after having used the following drugs: Marijuana.   Family History:   family history includes Cerebral aneurysm in his brother; Diabetes in his mother; Heart attack in his father; Hypertension in his mother.    Review of Systems: ROS   PHYSICAL EXAM: VS:  There were no vitals taken for this visit. , BMI There is no height or weight on file to calculate BMI. GEN: Well nourished, well developed, in no acute distress HEENT: normal Neck: no JVD, carotid bruits, or masses Cardiac: RRR; no murmurs, rubs, or gallops,no edema  Respiratory:  clear to auscultation bilaterally, normal work of breathing GI: soft, nontender, nondistended, + BS MS: no deformity or atrophy Skin: warm and dry, no rash Neuro:  Strength and sensation are intact Psych: euthymic mood, full affect    Recent Labs: 03/16/2024: ALT 18 03/19/2024: Hemoglobin 11.2; Platelets 324; TSH 4.651 03/21/2024: BUN 18; Creatinine, Ser 1.18; Magnesium 2.1; Potassium 3.8; Sodium 138    Lipid Panel Lab Results  Component Value Date   CHOL 120 03/17/2024   HDL 59 03/17/2024   LDLCALC 40 03/17/2024   TRIG 107 03/17/2024      Wt Readings from Last 3 Encounters:  06/01/24 165 lb (74.8 kg)  03/17/24 151 lb 3.8 oz (68.6 kg)  10/30/23 163 lb 6 oz (74.1 kg)       ASSESSMENT AND PLAN:  Problem List Items Addressed This Visit   None    Disposition:   F/U  12 months   Total encounter time more than 30 minutes  Greater than 50% was spent in counseling and coordination of care with the patient    Signed, Velinda Lunger, M.D., Ph.D. [facility] [place], [place] [phone]

## 2024-09-06 ENCOUNTER — Ambulatory Visit: Admitting: Cardiovascular Disease

## 2024-09-06 DIAGNOSIS — B20 Human immunodeficiency virus [HIV] disease: Secondary | ICD-10-CM

## 2024-09-06 DIAGNOSIS — E119 Type 2 diabetes mellitus without complications: Secondary | ICD-10-CM

## 2024-09-06 DIAGNOSIS — I5032 Chronic diastolic (congestive) heart failure: Secondary | ICD-10-CM

## 2024-09-06 DIAGNOSIS — Z720 Tobacco use: Secondary | ICD-10-CM

## 2024-09-06 DIAGNOSIS — I10 Essential (primary) hypertension: Secondary | ICD-10-CM

## 2024-09-07 ENCOUNTER — Encounter: Payer: Self-pay | Admitting: Cardiovascular Disease
